# Patient Record
Sex: Female | Born: 1985 | Race: Black or African American | Hispanic: No | Marital: Single | Smoking: Current every day smoker
Health system: Southern US, Community
[De-identification: ages and names within clinical notes are randomized; demographics above are authoritative.]

## PROBLEM LIST (undated history)

## (undated) ENCOUNTER — Emergency Department (HOSPITAL_COMMUNITY): Admission: EM | Payer: No Typology Code available for payment source | Source: Home / Self Care

## (undated) ENCOUNTER — Emergency Department (HOSPITAL_COMMUNITY): Admission: EM | Payer: Medicaid Other | Source: Home / Self Care

## (undated) ENCOUNTER — Ambulatory Visit (HOSPITAL_COMMUNITY): Admission: EM | Payer: 59 | Source: Home / Self Care

## (undated) DIAGNOSIS — R12 Heartburn: Secondary | ICD-10-CM

## (undated) DIAGNOSIS — G43909 Migraine, unspecified, not intractable, without status migrainosus: Secondary | ICD-10-CM

## (undated) DIAGNOSIS — O039 Complete or unspecified spontaneous abortion without complication: Secondary | ICD-10-CM

## (undated) DIAGNOSIS — N159 Renal tubulo-interstitial disease, unspecified: Secondary | ICD-10-CM

---
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
## 2005-06-01 ENCOUNTER — Emergency Department: Payer: Self-pay | Admitting: Emergency Medicine

## 2005-07-24 ENCOUNTER — Emergency Department: Payer: Self-pay | Admitting: Emergency Medicine

## 2005-12-18 ENCOUNTER — Emergency Department: Payer: Self-pay | Admitting: Emergency Medicine

## 2006-01-15 ENCOUNTER — Emergency Department: Payer: Self-pay | Admitting: Emergency Medicine

## 2006-01-17 ENCOUNTER — Ambulatory Visit: Payer: Self-pay | Admitting: Emergency Medicine

## 2006-01-20 ENCOUNTER — Emergency Department: Payer: Self-pay | Admitting: Emergency Medicine

## 2006-01-20 ENCOUNTER — Emergency Department (HOSPITAL_COMMUNITY): Admission: EM | Admit: 2006-01-20 | Discharge: 2006-01-20 | Payer: Self-pay | Admitting: Emergency Medicine

## 2006-01-21 ENCOUNTER — Emergency Department: Payer: Self-pay | Admitting: Emergency Medicine

## 2006-07-14 ENCOUNTER — Emergency Department (HOSPITAL_COMMUNITY): Admission: EM | Admit: 2006-07-14 | Discharge: 2006-07-14 | Payer: Self-pay | Admitting: Emergency Medicine

## 2006-09-08 ENCOUNTER — Emergency Department (HOSPITAL_COMMUNITY): Admission: EM | Admit: 2006-09-08 | Discharge: 2006-09-09 | Payer: Self-pay | Admitting: Emergency Medicine

## 2007-04-18 ENCOUNTER — Emergency Department: Payer: Self-pay | Admitting: Emergency Medicine

## 2007-12-23 ENCOUNTER — Emergency Department: Payer: Self-pay | Admitting: Emergency Medicine

## 2008-10-24 ENCOUNTER — Emergency Department (HOSPITAL_COMMUNITY): Admission: EM | Admit: 2008-10-24 | Discharge: 2008-10-24 | Payer: Self-pay | Admitting: Emergency Medicine

## 2009-10-12 ENCOUNTER — Emergency Department (HOSPITAL_COMMUNITY): Admission: EM | Admit: 2009-10-12 | Discharge: 2009-10-13 | Payer: Self-pay | Admitting: Emergency Medicine

## 2009-12-21 ENCOUNTER — Emergency Department (HOSPITAL_COMMUNITY): Admission: EM | Admit: 2009-12-21 | Discharge: 2009-12-22 | Payer: Self-pay | Admitting: Emergency Medicine

## 2009-12-22 ENCOUNTER — Inpatient Hospital Stay (HOSPITAL_COMMUNITY): Admission: AD | Admit: 2009-12-22 | Discharge: 2009-12-22 | Payer: Self-pay | Admitting: Obstetrics and Gynecology

## 2009-12-22 ENCOUNTER — Ambulatory Visit: Payer: Self-pay | Admitting: Obstetrics and Gynecology

## 2009-12-24 ENCOUNTER — Ambulatory Visit: Payer: Self-pay | Admitting: Obstetrics and Gynecology

## 2009-12-24 ENCOUNTER — Encounter: Payer: Self-pay | Admitting: Family

## 2009-12-24 LAB — CONVERTED CEMR LAB: GC Probe Amp, Urine: NEGATIVE

## 2009-12-26 ENCOUNTER — Inpatient Hospital Stay (HOSPITAL_COMMUNITY): Admission: AD | Admit: 2009-12-26 | Discharge: 2009-12-26 | Payer: Self-pay | Admitting: Family Medicine

## 2010-01-14 ENCOUNTER — Ambulatory Visit: Payer: Self-pay | Admitting: Obstetrics and Gynecology

## 2010-01-20 ENCOUNTER — Ambulatory Visit: Payer: Self-pay | Admitting: Family

## 2010-01-20 ENCOUNTER — Inpatient Hospital Stay (HOSPITAL_COMMUNITY): Admission: AD | Admit: 2010-01-20 | Discharge: 2010-01-20 | Payer: Self-pay | Admitting: Obstetrics and Gynecology

## 2010-01-27 ENCOUNTER — Inpatient Hospital Stay (HOSPITAL_COMMUNITY): Admission: AD | Admit: 2010-01-27 | Discharge: 2010-01-27 | Payer: Self-pay | Admitting: Obstetrics & Gynecology

## 2010-01-27 ENCOUNTER — Ambulatory Visit: Payer: Self-pay | Admitting: Advanced Practice Midwife

## 2010-01-28 ENCOUNTER — Ambulatory Visit: Payer: Self-pay | Admitting: Obstetrics and Gynecology

## 2010-01-29 ENCOUNTER — Ambulatory Visit (HOSPITAL_COMMUNITY): Admission: RE | Admit: 2010-01-29 | Discharge: 2010-01-29 | Payer: Self-pay | Admitting: Family Medicine

## 2010-02-06 ENCOUNTER — Ambulatory Visit: Payer: Self-pay | Admitting: Obstetrics and Gynecology

## 2010-02-06 ENCOUNTER — Inpatient Hospital Stay (HOSPITAL_COMMUNITY): Admission: AD | Admit: 2010-02-06 | Discharge: 2010-02-07 | Payer: Self-pay | Admitting: Obstetrics and Gynecology

## 2010-02-11 ENCOUNTER — Ambulatory Visit: Payer: Self-pay | Admitting: Obstetrics and Gynecology

## 2010-02-25 ENCOUNTER — Ambulatory Visit: Payer: Self-pay | Admitting: Obstetrics and Gynecology

## 2010-03-04 ENCOUNTER — Ambulatory Visit: Payer: Self-pay | Admitting: Obstetrics and Gynecology

## 2010-03-11 ENCOUNTER — Ambulatory Visit: Payer: Self-pay | Admitting: Obstetrics & Gynecology

## 2010-03-14 ENCOUNTER — Observation Stay: Payer: Self-pay | Admitting: Obstetrics and Gynecology

## 2010-03-18 ENCOUNTER — Ambulatory Visit: Payer: Self-pay | Admitting: Obstetrics and Gynecology

## 2010-03-20 ENCOUNTER — Inpatient Hospital Stay (HOSPITAL_COMMUNITY): Admission: AD | Admit: 2010-03-20 | Discharge: 2010-03-20 | Payer: Self-pay | Admitting: Obstetrics & Gynecology

## 2010-03-25 ENCOUNTER — Ambulatory Visit: Payer: Self-pay | Admitting: Obstetrics and Gynecology

## 2010-03-27 ENCOUNTER — Inpatient Hospital Stay (HOSPITAL_COMMUNITY): Admission: AD | Admit: 2010-03-27 | Discharge: 2010-03-27 | Payer: Self-pay | Admitting: Obstetrics & Gynecology

## 2010-03-27 ENCOUNTER — Ambulatory Visit: Payer: Self-pay | Admitting: Obstetrics and Gynecology

## 2010-03-28 ENCOUNTER — Ambulatory Visit: Payer: Self-pay | Admitting: Obstetrics and Gynecology

## 2010-03-28 ENCOUNTER — Inpatient Hospital Stay (HOSPITAL_COMMUNITY): Admission: AD | Admit: 2010-03-28 | Discharge: 2010-04-01 | Payer: Self-pay | Admitting: Obstetrics and Gynecology

## 2010-03-31 ENCOUNTER — Ambulatory Visit: Payer: Self-pay | Admitting: Vascular Surgery

## 2010-03-31 ENCOUNTER — Encounter: Payer: Self-pay | Admitting: Obstetrics & Gynecology

## 2010-05-13 ENCOUNTER — Emergency Department (HOSPITAL_COMMUNITY): Admission: EM | Admit: 2010-05-13 | Discharge: 2010-05-13 | Payer: Self-pay | Admitting: Emergency Medicine

## 2010-05-14 ENCOUNTER — Ambulatory Visit: Payer: Self-pay | Admitting: Obstetrics and Gynecology

## 2010-05-14 LAB — CONVERTED CEMR LAB
MCV: 85.7 fL (ref 78.0–100.0)
Platelets: 361 10*3/uL (ref 150–400)
WBC: 9.9 10*3/uL (ref 4.0–10.5)

## 2010-06-29 ENCOUNTER — Ambulatory Visit: Payer: Self-pay | Admitting: Obstetrics & Gynecology

## 2010-07-24 ENCOUNTER — Emergency Department (HOSPITAL_COMMUNITY): Admission: EM | Admit: 2010-07-24 | Discharge: 2010-07-24 | Payer: Self-pay | Admitting: Emergency Medicine

## 2010-09-02 ENCOUNTER — Emergency Department (HOSPITAL_COMMUNITY): Admission: EM | Admit: 2010-09-02 | Discharge: 2010-09-02 | Payer: Self-pay | Admitting: Emergency Medicine

## 2010-09-07 ENCOUNTER — Emergency Department (HOSPITAL_COMMUNITY): Admission: EM | Admit: 2010-09-07 | Discharge: 2010-09-08 | Payer: Self-pay | Admitting: Emergency Medicine

## 2010-09-21 ENCOUNTER — Ambulatory Visit: Payer: Self-pay | Admitting: Obstetrics and Gynecology

## 2010-10-04 ENCOUNTER — Emergency Department (HOSPITAL_COMMUNITY)
Admission: EM | Admit: 2010-10-04 | Discharge: 2010-10-05 | Payer: Self-pay | Source: Home / Self Care | Admitting: Emergency Medicine

## 2010-11-23 ENCOUNTER — Emergency Department (HOSPITAL_COMMUNITY)
Admission: EM | Admit: 2010-11-23 | Discharge: 2010-11-23 | Payer: Self-pay | Source: Home / Self Care | Admitting: Emergency Medicine

## 2010-12-07 ENCOUNTER — Ambulatory Visit: Admit: 2010-12-07 | Payer: Self-pay | Admitting: Obstetrics and Gynecology

## 2011-01-18 ENCOUNTER — Emergency Department (HOSPITAL_COMMUNITY): Admission: EM | Admit: 2011-01-18 | Payer: Medicaid Other | Source: Home / Self Care

## 2011-01-18 ENCOUNTER — Emergency Department: Payer: Self-pay | Admitting: Unknown Physician Specialty

## 2011-01-20 ENCOUNTER — Ambulatory Visit: Payer: Self-pay | Admitting: Family Medicine

## 2011-01-27 LAB — CBC
MCV: 79.1 fL (ref 78.0–100.0)
Platelets: 300 10*3/uL (ref 150–400)
RBC: 4.87 MIL/uL (ref 3.87–5.11)
WBC: 12.4 10*3/uL — ABNORMAL HIGH (ref 4.0–10.5)

## 2011-01-27 LAB — DIFFERENTIAL
Eosinophils Relative: 1 % (ref 0–5)
Lymphocytes Relative: 28 % (ref 12–46)
Lymphs Abs: 3.4 10*3/uL (ref 0.7–4.0)
Neutro Abs: 7.7 10*3/uL (ref 1.7–7.7)

## 2011-01-27 LAB — POCT CARDIAC MARKERS
CKMB, poc: <1 ng/mL — ABNORMAL LOW (ref 1.0–8.0)
Myoglobin, poc: 42.4 ng/mL (ref 12–200)
Troponin i, poc: <0.05 ng/mL (ref 0.00–0.09)
Troponin i, poc: <0.05 ng/mL (ref 0.00–0.09)

## 2011-01-27 LAB — BASIC METABOLIC PANEL
Chloride: 108 mEq/L (ref 96–112)
Creatinine, Ser: 0.88 mg/dL (ref 0.4–1.2)
GFR calc Af Amer: >60 mL/min (ref >60)
Potassium: 3.7 mEq/L (ref 3.5–5.1)

## 2011-02-01 LAB — CBC
HCT: 25.8 % — ABNORMAL LOW (ref 36.0–46.0)
HCT: 36.7 % (ref 36.0–46.0)
Hemoglobin: 9.1 g/dL — ABNORMAL LOW (ref 12.0–15.0)
Hemoglobin: 9.4 g/dL — ABNORMAL LOW (ref 12.0–15.0)
MCHC: 34.6 g/dL (ref 30.0–36.0)
MCHC: 34.7 g/dL (ref 30.0–36.0)
MCHC: 35.3 g/dL (ref 30.0–36.0)
MCV: 89.4 fL (ref 78.0–100.0)
MCV: 89.6 fL (ref 78.0–100.0)
Platelets: 72 10*3/uL — ABNORMAL LOW (ref 150–400)
RBC: 2.89 MIL/uL — ABNORMAL LOW (ref 3.87–5.11)
RBC: 3.02 MIL/uL — ABNORMAL LOW (ref 3.87–5.11)
RDW: 14.2 % (ref 11.5–15.5)
RDW: 14.3 % (ref 11.5–15.5)
WBC: 17.2 10*3/uL — ABNORMAL HIGH (ref 4.0–10.5)

## 2011-02-01 LAB — PLATELET COUNT: Platelets: 132 10*3/uL — ABNORMAL LOW (ref 150–400)

## 2011-02-01 LAB — COMPREHENSIVE METABOLIC PANEL
Albumin: 3 g/dL — ABNORMAL LOW (ref 3.5–5.2)
BUN: 3 mg/dL — ABNORMAL LOW (ref 6–23)
Calcium: 8.8 mg/dL (ref 8.4–10.5)
Creatinine, Ser: 0.85 mg/dL (ref 0.4–1.2)
Glucose, Bld: 84 mg/dL (ref 70–99)
Total Protein: 6.5 g/dL (ref 6.0–8.3)

## 2011-02-02 LAB — POCT URINALYSIS DIP (DEVICE)
Hgb urine dipstick: NEGATIVE
Hgb urine dipstick: NEGATIVE
Hgb urine dipstick: NEGATIVE
Ketones, ur: NEGATIVE mg/dL
Ketones, ur: NEGATIVE mg/dL
Nitrite: NEGATIVE
Protein, ur: NEGATIVE mg/dL
Protein, ur: NEGATIVE mg/dL
Protein, ur: NEGATIVE mg/dL
Specific Gravity, Urine: 1.015 (ref 1.005–1.030)
Specific Gravity, Urine: 1.015 (ref 1.005–1.030)
Specific Gravity, Urine: 1.02 (ref 1.005–1.030)
Urobilinogen, UA: 0.2 mg/dL (ref 0.0–1.0)
Urobilinogen, UA: 0.2 mg/dL (ref 0.0–1.0)
Urobilinogen, UA: 1 mg/dL (ref 0.0–1.0)
pH: 5.5 (ref 5.0–8.0)
pH: 7 (ref 5.0–8.0)
pH: 6 (ref 5.0–8.0)

## 2011-02-02 LAB — CBC
HCT: 35 % — ABNORMAL LOW (ref 36.0–46.0)
MCHC: 35 g/dL (ref 30.0–36.0)
MCV: 88.9 fL (ref 78.0–100.0)
Platelets: 80 10*3/uL — ABNORMAL LOW (ref 150–400)
RDW: 14.3 % (ref 11.5–15.5)
WBC: 14 10*3/uL — ABNORMAL HIGH (ref 4.0–10.5)

## 2011-02-03 LAB — POCT URINALYSIS DIP (DEVICE)
Glucose, UA: NEGATIVE mg/dL
Protein, ur: NEGATIVE mg/dL
Specific Gravity, Urine: 1.025 (ref 1.005–1.030)
Urobilinogen, UA: 1 mg/dL (ref 0.0–1.0)

## 2011-02-03 LAB — URINALYSIS, ROUTINE W REFLEX MICROSCOPIC
Bilirubin Urine: NEGATIVE
Glucose, UA: NEGATIVE mg/dL
Hgb urine dipstick: NEGATIVE
Protein, ur: NEGATIVE mg/dL
Specific Gravity, Urine: 1.028 (ref 1.005–1.030)
Urobilinogen, UA: 2 mg/dL — ABNORMAL HIGH (ref 0.0–1.0)

## 2011-02-03 LAB — URINE MICROSCOPIC-ADD ON

## 2011-02-04 LAB — DIFFERENTIAL
Basophils Absolute: 0 10*3/uL (ref 0.0–0.1)
Basophils Relative: 0 % (ref 0–1)
Lymphocytes Relative: 13 % (ref 12–46)
Monocytes Absolute: 1.3 10*3/uL — ABNORMAL HIGH (ref 0.1–1.0)
Neutro Abs: 10.1 10*3/uL — ABNORMAL HIGH (ref 1.7–7.7)
Neutrophils Relative %: 76 % (ref 43–77)

## 2011-02-04 LAB — URINALYSIS, ROUTINE W REFLEX MICROSCOPIC
Glucose, UA: NEGATIVE mg/dL
Ketones, ur: NEGATIVE mg/dL
Nitrite: NEGATIVE
Protein, ur: NEGATIVE mg/dL
Urobilinogen, UA: 2 mg/dL — ABNORMAL HIGH (ref 0.0–1.0)

## 2011-02-04 LAB — RAPID URINE DRUG SCREEN, HOSP PERFORMED
Amphetamines: NOT DETECTED
Barbiturates: NOT DETECTED
Benzodiazepines: NOT DETECTED
Cocaine: NOT DETECTED
Opiates: NOT DETECTED
Tetrahydrocannabinol: NOT DETECTED

## 2011-02-04 LAB — POCT URINALYSIS DIP (DEVICE)
Bilirubin Urine: NEGATIVE
Glucose, UA: NEGATIVE mg/dL
Hgb urine dipstick: NEGATIVE
Ketones, ur: NEGATIVE mg/dL
Nitrite: NEGATIVE

## 2011-02-04 LAB — TYPE AND SCREEN
ABO/RH(D): B POS
Antibody Screen: NEGATIVE

## 2011-02-04 LAB — RPR: RPR Ser Ql: NONREACTIVE

## 2011-02-04 LAB — CBC
Hemoglobin: 11.6 g/dL — ABNORMAL LOW (ref 12.0–15.0)
MCHC: 34.1 g/dL (ref 30.0–36.0)
Platelets: 220 10*3/uL (ref 150–400)
RDW: 13.7 % (ref 11.5–15.5)

## 2011-02-04 LAB — RUBELLA SCREEN: Rubella: 23.9 IU/mL — ABNORMAL HIGH

## 2011-02-04 LAB — HEPATITIS B SURFACE ANTIGEN: Hepatitis B Surface Ag: NEGATIVE

## 2011-02-04 LAB — URINE CULTURE

## 2011-02-04 LAB — URINE MICROSCOPIC-ADD ON

## 2011-02-08 LAB — COMPREHENSIVE METABOLIC PANEL
AST: 11 U/L (ref 0–37)
BUN: 4 mg/dL — ABNORMAL LOW (ref 6–23)
CO2: 25 mEq/L (ref 19–32)
Chloride: 105 mEq/L (ref 96–112)
Creatinine, Ser: 0.66 mg/dL (ref 0.4–1.2)
GFR calc non Af Amer: >60 mL/min (ref >60)
Glucose, Bld: 92 mg/dL (ref 70–99)
Total Bilirubin: 0.3 mg/dL (ref 0.3–1.2)

## 2011-02-08 LAB — URINALYSIS, ROUTINE W REFLEX MICROSCOPIC
Bilirubin Urine: NEGATIVE
Nitrite: NEGATIVE
Specific Gravity, Urine: >1.03 — ABNORMAL HIGH (ref 1.005–1.030)
Urobilinogen, UA: 1 mg/dL (ref 0.0–1.0)

## 2011-02-08 LAB — POCT URINALYSIS DIP (DEVICE)
Bilirubin Urine: NEGATIVE
Bilirubin Urine: NEGATIVE
Glucose, UA: NEGATIVE mg/dL
Hgb urine dipstick: NEGATIVE
Ketones, ur: NEGATIVE mg/dL
Ketones, ur: NEGATIVE mg/dL
Nitrite: NEGATIVE
Specific Gravity, Urine: 1.015 (ref 1.005–1.030)
Specific Gravity, Urine: 1.02 (ref 1.005–1.030)
pH: 7 (ref 5.0–8.0)

## 2011-02-08 LAB — CBC
HCT: 33.2 % — ABNORMAL LOW (ref 36.0–46.0)
Hemoglobin: 11.4 g/dL — ABNORMAL LOW (ref 12.0–15.0)
MCHC: 34.2 g/dL (ref 30.0–36.0)
MCV: 91.7 fL (ref 78.0–100.0)
RBC: 3.62 MIL/uL — ABNORMAL LOW (ref 3.87–5.11)
WBC: 17 10*3/uL — ABNORMAL HIGH (ref 4.0–10.5)

## 2011-02-10 ENCOUNTER — Ambulatory Visit: Payer: Self-pay | Admitting: Family Medicine

## 2011-02-20 ENCOUNTER — Emergency Department (HOSPITAL_COMMUNITY)
Admission: EM | Admit: 2011-02-20 | Discharge: 2011-02-21 | Disposition: A | Discharge status: Home / Self Care | Payer: Medicaid Other | Attending: Emergency Medicine | Admitting: Emergency Medicine

## 2011-02-20 DIAGNOSIS — R51 Headache: Secondary | ICD-10-CM | POA: Insufficient documentation

## 2011-02-20 DIAGNOSIS — J3489 Other specified disorders of nose and nasal sinuses: Secondary | ICD-10-CM | POA: Insufficient documentation

## 2011-02-20 DIAGNOSIS — R110 Nausea: Secondary | ICD-10-CM | POA: Insufficient documentation

## 2011-02-20 DIAGNOSIS — H6690 Otitis media, unspecified, unspecified ear: Secondary | ICD-10-CM | POA: Insufficient documentation

## 2011-02-20 DIAGNOSIS — J329 Chronic sinusitis, unspecified: Secondary | ICD-10-CM | POA: Insufficient documentation

## 2011-03-26 ENCOUNTER — Ambulatory Visit: Payer: Medicaid Other | Admitting: Family Medicine

## 2011-03-29 ENCOUNTER — Emergency Department (HOSPITAL_COMMUNITY)
Admission: EM | Admit: 2011-03-29 | Discharge: 2011-03-29 | Discharge status: Against Medical Advice | Payer: Medicaid Other | Attending: Emergency Medicine | Admitting: Emergency Medicine

## 2011-03-29 DIAGNOSIS — Z0389 Encounter for observation for other suspected diseases and conditions ruled out: Secondary | ICD-10-CM | POA: Insufficient documentation

## 2011-03-30 ENCOUNTER — Emergency Department (HOSPITAL_COMMUNITY)
Admission: EM | Admit: 2011-03-30 | Discharge: 2011-03-30 | Disposition: A | Discharge status: Home / Self Care | Payer: Medicaid Other | Attending: Emergency Medicine | Admitting: Emergency Medicine

## 2011-03-30 DIAGNOSIS — J329 Chronic sinusitis, unspecified: Secondary | ICD-10-CM | POA: Insufficient documentation

## 2011-03-30 DIAGNOSIS — R51 Headache: Secondary | ICD-10-CM | POA: Insufficient documentation

## 2011-03-30 DIAGNOSIS — H53149 Visual discomfort, unspecified: Secondary | ICD-10-CM | POA: Insufficient documentation

## 2011-03-30 DIAGNOSIS — J309 Allergic rhinitis, unspecified: Secondary | ICD-10-CM | POA: Insufficient documentation

## 2011-03-30 DIAGNOSIS — J3489 Other specified disorders of nose and nasal sinuses: Secondary | ICD-10-CM | POA: Insufficient documentation

## 2011-06-27 IMAGING — US US OB FOLLOW-UP
1 series · 14 of 28 positions shown · non-contrast
Comparison: none

OBSTETRICAL ULTRASOUND:
 This ultrasound exam was performed in the [HOSPITAL] Ultrasound Department.  The OB US report was generated in the AS system, and faxed to the ordering physician.  This report is also available in [HOSPITAL]?s [employer] and in [REDACTED] PACS.

[Series 1: us ob follow up · 0.30mm/px · 14 of 28 slices shown]
[im 2/28]
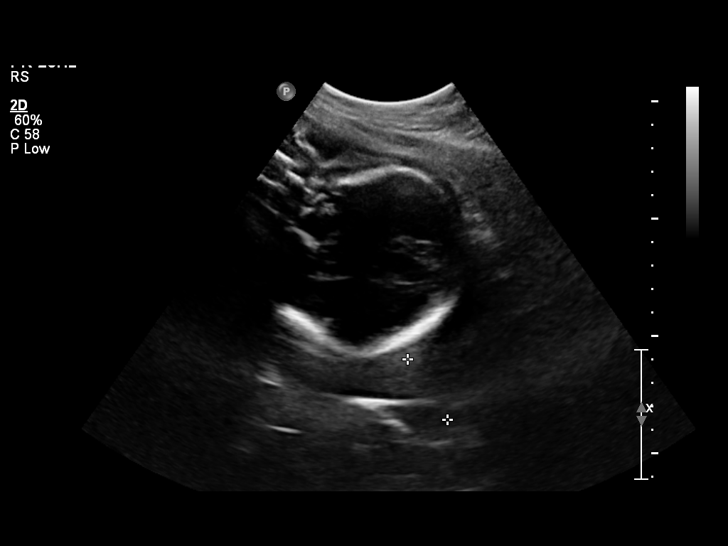
[im 4/28]
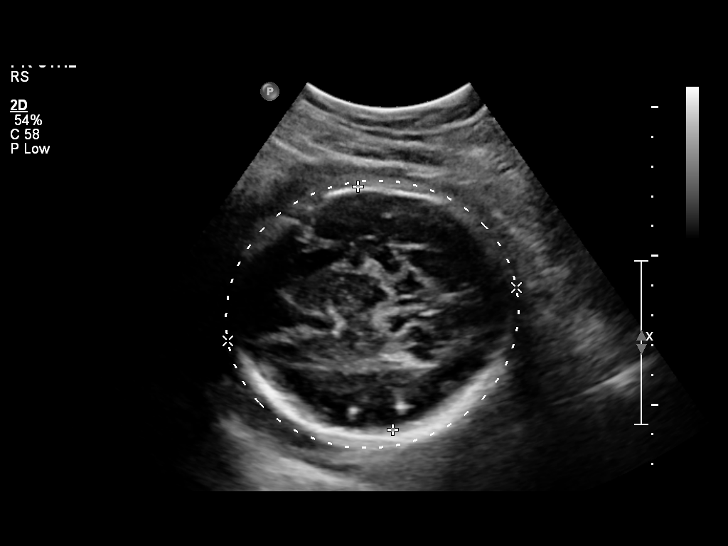
[im 6/28]
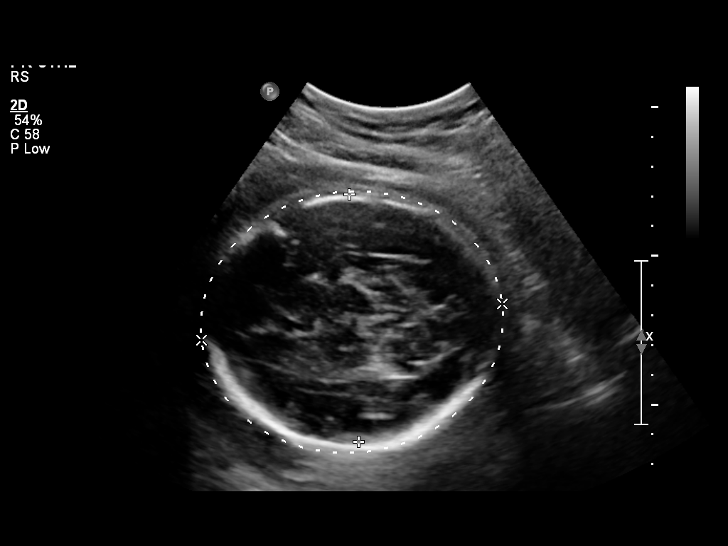
[im 8/28]
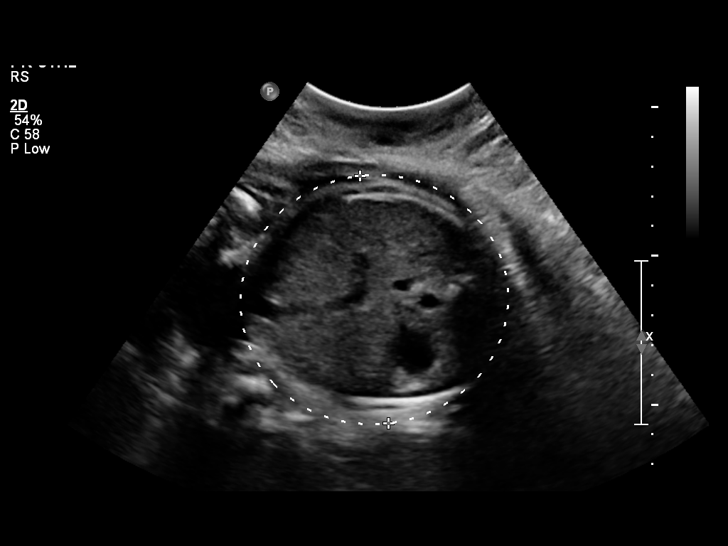
[im 10/28]
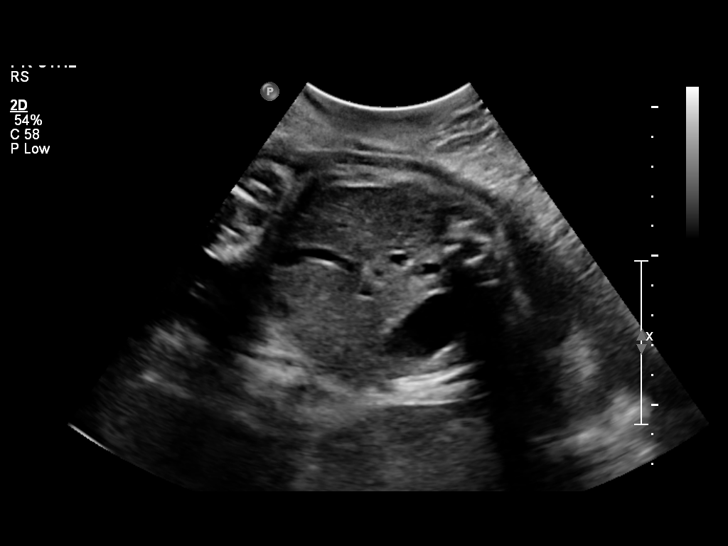
[im 12/28]
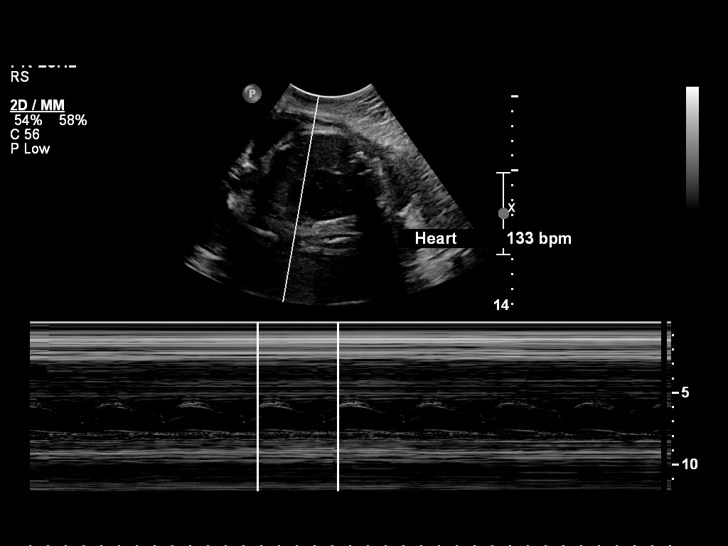
[im 14/28]
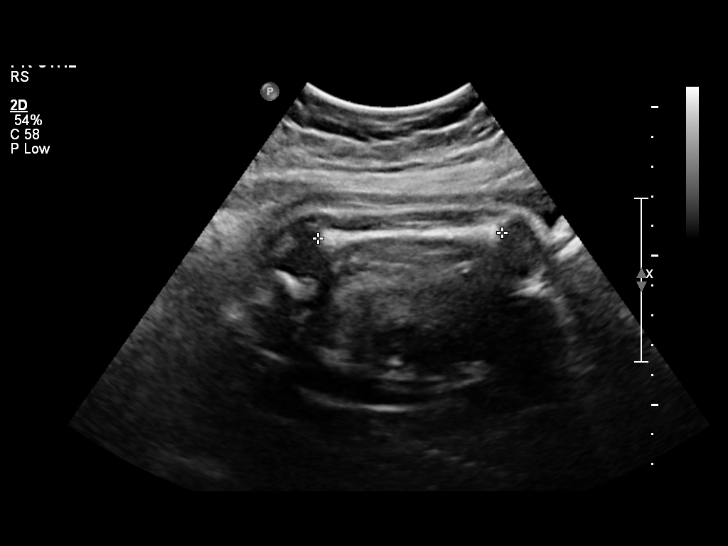
[im 16/28]
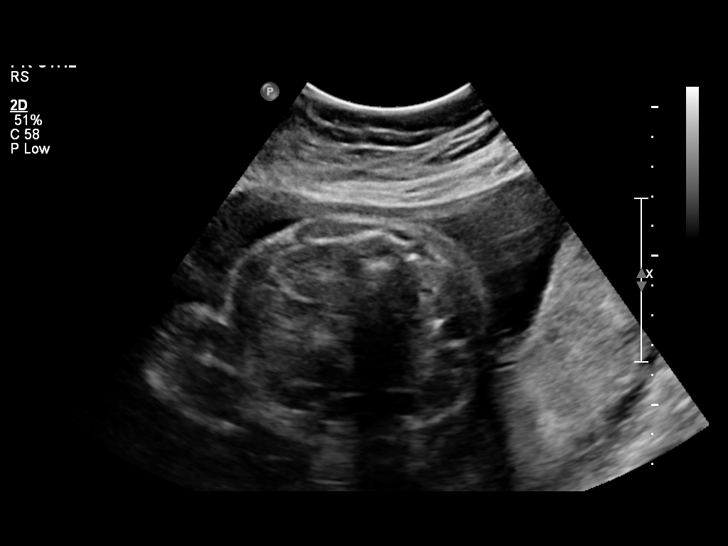
[im 18/28]
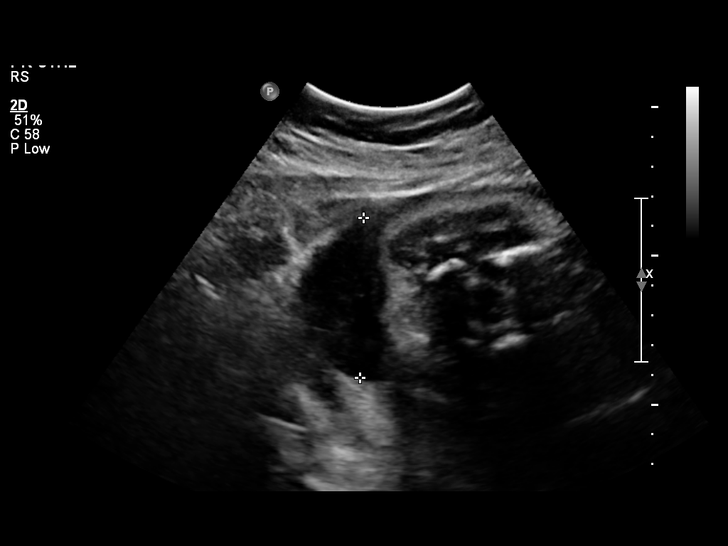
[im 20/28]
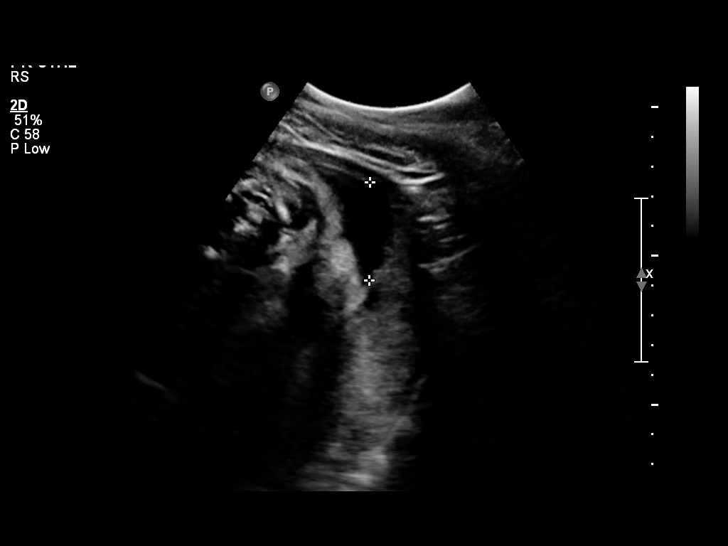
[im 22/28]
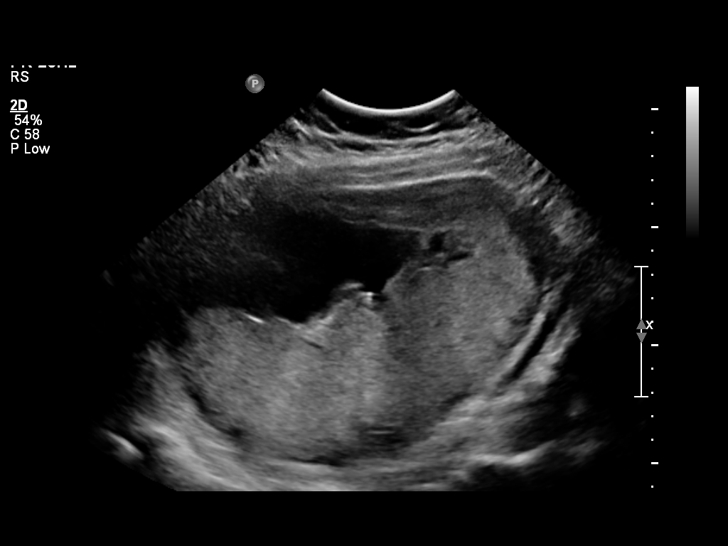
[im 24/28]
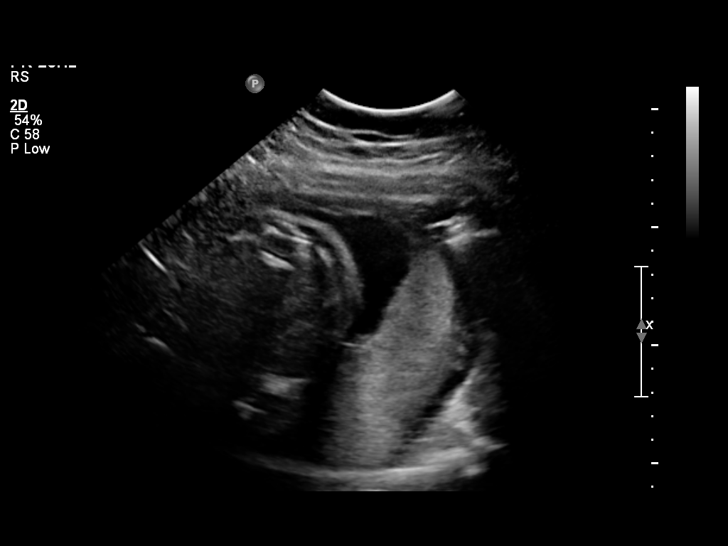
[im 26/28]
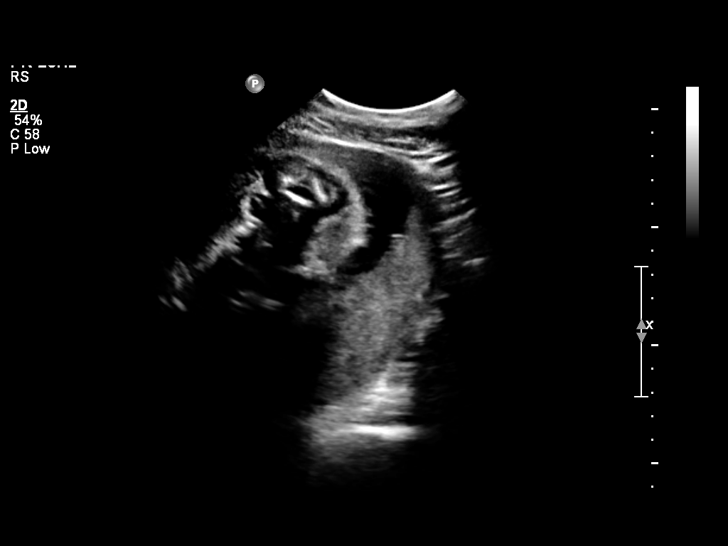
[im 28/28]
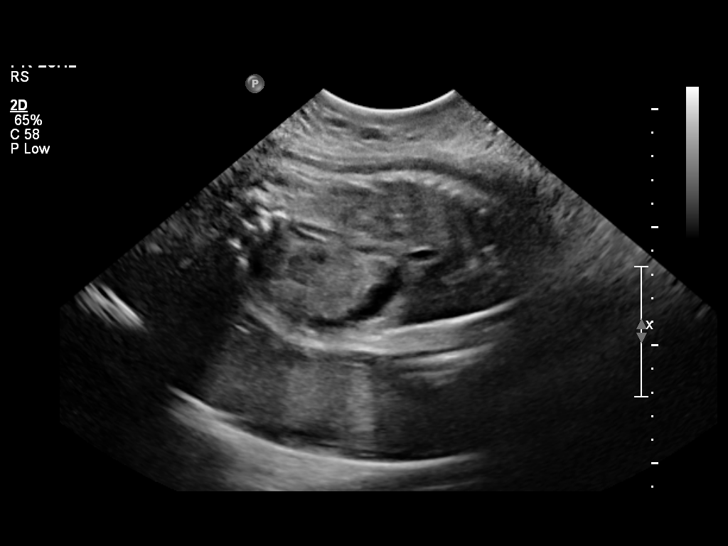

[14 of 28 positions shown; findings below may reference images not displayed]

IMPRESSION: See AS Obstetric US report.

## 2011-07-02 ENCOUNTER — Encounter: Payer: Self-pay | Admitting: Family Medicine

## 2011-07-02 ENCOUNTER — Other Ambulatory Visit (HOSPITAL_COMMUNITY)
Admission: RE | Admit: 2011-07-02 | Discharge: 2011-07-02 | Disposition: A | Discharge status: Home / Self Care | Payer: Medicaid Other | Source: Ambulatory Visit | Attending: Family Medicine | Admitting: Family Medicine

## 2011-07-02 ENCOUNTER — Ambulatory Visit (INDEPENDENT_AMBULATORY_CARE_PROVIDER_SITE_OTHER): Payer: Medicaid Other | Admitting: Family Medicine

## 2011-07-02 VITALS — BP 119/78 | HR 86 | Temp 97.4°F | Ht 66.0 in | Wt 262.0 lb

## 2011-07-02 DIAGNOSIS — Z0000 Encounter for general adult medical examination without abnormal findings: Secondary | ICD-10-CM

## 2011-07-02 DIAGNOSIS — Z01419 Encounter for gynecological examination (general) (routine) without abnormal findings: Secondary | ICD-10-CM | POA: Insufficient documentation

## 2011-07-02 DIAGNOSIS — Z3049 Encounter for surveillance of other contraceptives: Secondary | ICD-10-CM

## 2011-07-02 DIAGNOSIS — Z309 Encounter for contraceptive management, unspecified: Secondary | ICD-10-CM

## 2011-07-02 LAB — POCT PREGNANCY, URINE: Preg Test, Ur: NEGATIVE

## 2011-07-02 MED ORDER — MEDROXYPROGESTERONE ACETATE 150 MG/ML IM SUSP
150.0000 mg | Freq: Once | INTRAMUSCULAR | Status: AC
  Administered 2011-07-02: 150 mg via INTRAMUSCULAR

## 2011-07-02 NOTE — Progress Notes (Signed)
  Subjective:    Patient ID: Amy Mcfarland, female    DOB: 01-18-1986, 25 y.o.   MRN: [id number]  HPI Here for routine annual GYN visit.  LMP 7/21.  28 day cycle.  Flow 3-5 days.  Not sexually active now.  No vaginal discharge.  Denies dysmenorrhea, urinary incontinence, vaginal discharge, abnormal bleeding.  Would like to continue with depo provera.   Review of Systems  Constitutional: Negative for fever, chills and fatigue.  Genitourinary: Negative for dysuria, frequency, hematuria, flank pain, vaginal bleeding, vaginal discharge, difficulty urinating, vaginal pain and dyspareunia.  Musculoskeletal: Negative for back pain.       Objective:   Physical Exam  Constitutional: She appears well-developed and well-nourished.  Abdominal: She exhibits no distension and no mass. There is no tenderness. There is no rebound and no guarding.  Genitourinary:       External female genitalia normal.  Normal urethra, vaginal walls.  Cervix grossly normal.  Normal feeling uterus and adenexa.  PAP done.  Skin: Skin is warm and dry.          Assessment & Plan:  1. Health exam -PAP done  2. Contraception management Pt wishes to continue with depo.

## 2011-07-02 NOTE — Progress Notes (Signed)
Pt wants to start back on Depo Provera. Also needs pap smear

## 2011-07-08 ENCOUNTER — Encounter: Payer: Self-pay | Admitting: *Deleted

## 2011-07-12 ENCOUNTER — Inpatient Hospital Stay (INDEPENDENT_AMBULATORY_CARE_PROVIDER_SITE_OTHER)
Admission: RE | Admit: 2011-07-12 | Discharge: 2011-07-12 | Disposition: A | Discharge status: Home / Self Care | Payer: Medicaid Other | Source: Ambulatory Visit | Attending: Family Medicine | Admitting: Family Medicine

## 2011-07-12 ENCOUNTER — Ambulatory Visit (INDEPENDENT_AMBULATORY_CARE_PROVIDER_SITE_OTHER): Payer: Medicaid Other

## 2011-07-12 DIAGNOSIS — S8010XA Contusion of unspecified lower leg, initial encounter: Secondary | ICD-10-CM

## 2011-07-12 LAB — D-DIMER, QUANTITATIVE: D-Dimer, Quant: <0.22 ug/mL-FEU (ref 0.00–0.48)

## 2011-08-01 ENCOUNTER — Emergency Department (HOSPITAL_COMMUNITY): Payer: Medicaid Other

## 2011-08-01 ENCOUNTER — Emergency Department (HOSPITAL_COMMUNITY)
Admission: EM | Admit: 2011-08-01 | Discharge: 2011-08-02 | Disposition: A | Discharge status: Home / Self Care | Payer: Medicaid Other | Attending: Emergency Medicine | Admitting: Emergency Medicine

## 2011-08-01 DIAGNOSIS — J029 Acute pharyngitis, unspecified: Secondary | ICD-10-CM | POA: Insufficient documentation

## 2011-08-01 DIAGNOSIS — R059 Cough, unspecified: Secondary | ICD-10-CM | POA: Insufficient documentation

## 2011-08-01 DIAGNOSIS — R6889 Other general symptoms and signs: Secondary | ICD-10-CM | POA: Insufficient documentation

## 2011-08-01 DIAGNOSIS — G43909 Migraine, unspecified, not intractable, without status migrainosus: Secondary | ICD-10-CM | POA: Insufficient documentation

## 2011-08-01 DIAGNOSIS — J3489 Other specified disorders of nose and nasal sinuses: Secondary | ICD-10-CM | POA: Insufficient documentation

## 2011-08-01 DIAGNOSIS — R05 Cough: Secondary | ICD-10-CM | POA: Insufficient documentation

## 2011-08-01 DIAGNOSIS — J069 Acute upper respiratory infection, unspecified: Secondary | ICD-10-CM | POA: Insufficient documentation

## 2011-09-17 ENCOUNTER — Ambulatory Visit: Payer: Medicaid Other

## 2011-10-22 ENCOUNTER — Ambulatory Visit: Payer: Medicaid Other

## 2011-10-22 ENCOUNTER — Emergency Department (HOSPITAL_COMMUNITY)
Admission: EM | Admit: 2011-10-22 | Discharge: 2011-10-22 | Disposition: A | Discharge status: Home / Self Care | Payer: Medicaid Other | Attending: Emergency Medicine | Admitting: Emergency Medicine

## 2011-10-22 ENCOUNTER — Encounter (HOSPITAL_COMMUNITY): Payer: Self-pay | Admitting: Emergency Medicine

## 2011-10-22 DIAGNOSIS — M549 Dorsalgia, unspecified: Secondary | ICD-10-CM

## 2011-10-22 DIAGNOSIS — M546 Pain in thoracic spine: Secondary | ICD-10-CM | POA: Insufficient documentation

## 2011-10-22 MED ORDER — OXYCODONE-ACETAMINOPHEN 5-325 MG PO TABS: 2.0000 | ORAL_TABLET | ORAL | Status: AC | PRN

## 2011-10-22 NOTE — ED Notes (Signed)
Pt states she is here for back pain with no known injury, she states she has not been lifting anything heavy and it hurts when here arms are down and hanging, pt states when she moves her hands up the pain is relieved, she states she has been hurting for 2 weeks

## 2011-10-22 NOTE — ED Notes (Signed)
PT. REPORTS UPPER BACK PAIN FOR 2 WEEKS ,  DENIES INJURY OR FALL , PAIN WITH MOVEMENT AND CERTAIN POSITIONS ,  SLIGHT SOB , NO COUGH.

## 2011-10-22 NOTE — ED Provider Notes (Signed)
History     CSN: [id number] Arrival date & time: 10/22/2011  2:16 AM   First MD Initiated Contact with Patient 10/22/11 0401      Chief Complaint  Patient presents with  . Back Pain    (Consider location/radiation/quality/duration/timing/severity/associated sxs/prior treatment) HPI This 25 year old female has 2 weeks of atraumatic upper back pain without radiation or associated symptoms. This pain is constant severe and positional nonexertional. She is no fever chest pain cough shortness of breath abdominal pain vomiting radiation weakness numbness paresthesias change in bowel or bladder function or other concerns. She has not had any fever or IV drug use or rash. There's been no recent trauma travel or immobilization. She would like a work excuse for the next couple of days. She states she has had good liquid Percocet for pain in the past and over-the-counter medicines are not helping this pain yet. History reviewed. No pertinent past medical history.  Past Surgical History  Procedure Date  . Cesarean section   . Cesarean section     Family History  Problem Relation Age of Onset  . Asthma Maternal Uncle   . Cancer Maternal Grandmother     breast cancer  . Cancer Paternal Grandmother     History  Substance Use Topics  . Smoking status: Smoker, Current Status Unknown -- 0.2 packs/day for 7 years    Types: Cigarettes  . Smokeless tobacco: Never Used  . Alcohol Use: Yes    OB History    Grav Para Term Preterm Abortions TAB SAB Ect Mult Living   2    1  1   1       Review of Systems  Constitutional: Negative for fever.       10 Systems reviewed and are negative for acute change except as noted in the HPI.  HENT: Negative for congestion.   Eyes: Negative for discharge and redness.  Respiratory: Negative for cough and shortness of breath.   Cardiovascular: Negative for chest pain.  Gastrointestinal: Negative for vomiting and abdominal pain.  Musculoskeletal: Positive  for back pain.  Skin: Negative for rash.  Neurological: Negative for syncope, numbness and headaches.  Psychiatric/Behavioral:       No behavior change.    Allergies  Review of patient's allergies indicates no known allergies.  Home Medications   Current Outpatient Rx  Name Route Sig Dispense Refill  . IBUPROFEN 200 MG PO TABS Oral Take 200 mg by mouth every 6 (six) hours as needed. For pain    . OXYCODONE-ACETAMINOPHEN 5-325 MG PO TABS Oral Take 2 tablets by mouth every 4 (four) hours as needed for pain. 15 tablet 0    BP 109/59  Pulse 83  Temp(Src) 98.2 F (36.8 C) (Oral)  Resp 16  SpO2 100%  Physical Exam  Nursing note and vitals reviewed. Constitutional:       Awake, alert, nontoxic appearance.  HENT:  Head: Atraumatic.  Mouth/Throat: Oropharynx is clear and moist.  Eyes: Right eye exhibits no discharge. Left eye exhibits no discharge.  Neck: Neck supple.  Cardiovascular: Normal rate, regular rhythm and normal heart sounds.   No murmur heard. Pulmonary/Chest: Effort normal and breath sounds normal. No respiratory distress. She has no wheezes. She has no rales. She exhibits no tenderness.  Abdominal: Soft. Bowel sounds are normal. There is no tenderness. There is no rebound.  Musculoskeletal: She exhibits no edema and no tenderness.       Baseline ROM, no obvious new focal weakness.  No midline  cervical thoracic or lumbar tenderness. She does have exactly reproducible parathoracic soft tissue tenderness and bilateral trapezius muscle region soft tissue tenderness.  Neurological: She is alert.       Mental status and motor strength appears baseline for patient and situation.  Skin: No rash noted.  Psychiatric: She has a normal mood and affect.    ED Course  Procedures (including critical care time)  Labs Reviewed - No data to display No results found.   1. Back pain       MDM  I doubt any other EMC precluding discharge at this time including, but not  necessarily limited to the following:SBI.        Hurman Horn, MD 10/22/11 [phone]

## 2011-11-24 ENCOUNTER — Encounter (HOSPITAL_COMMUNITY): Payer: Self-pay | Admitting: *Deleted

## 2011-11-24 ENCOUNTER — Emergency Department (INDEPENDENT_AMBULATORY_CARE_PROVIDER_SITE_OTHER)
Admission: EM | Admit: 2011-11-24 | Discharge: 2011-11-24 | Disposition: A | Discharge status: Home / Self Care | Payer: Medicaid Other | Source: Home / Self Care | Attending: Family Medicine | Admitting: Family Medicine

## 2011-11-24 DIAGNOSIS — K0889 Other specified disorders of teeth and supporting structures: Secondary | ICD-10-CM

## 2011-11-24 DIAGNOSIS — K089 Disorder of teeth and supporting structures, unspecified: Secondary | ICD-10-CM

## 2011-11-24 MED ORDER — CLINDAMYCIN HCL 150 MG PO CAPS: 150.0000 mg | ORAL_CAPSULE | Freq: Four times a day (QID) | ORAL | Status: AC

## 2011-11-24 MED ORDER — HYDROCODONE-ACETAMINOPHEN 5-325 MG PO TABS: ORAL_TABLET | ORAL | Status: AC

## 2011-11-24 NOTE — ED Notes (Signed)
Pt  Reports  r   Sided  Toothache      X  3  Days      r  Earache  As  Well

## 2011-11-24 NOTE — ED Provider Notes (Signed)
History     CSN: [id number]  Arrival date & time 11/24/11  1943   First MD Initiated Contact with Patient 11/24/11 1946      Chief Complaint  Patient presents with  . Dental Pain    (Consider location/radiation/quality/duration/timing/severity/associated sxs/prior treatment) HPI Comments: Amy Mcfarland presents for evaluation of RIGHT sided dental pain. She reports a long hx of dental problems and has had a root canal in the past. She also has multiple broken teeth. She reports seeing a dentist but he could not see her for the surgery until mid-February. She denies any fever.   Patient is a 26 y.o. female presenting with tooth pain. The history is provided by the patient.  Dental PainThe primary symptoms include mouth pain and dental injury. The symptoms are unchanged. The symptoms are new.  Mouth pain began more than 1 month ago. Mouth pain occurs intermittently. Affected locations include: teeth and gum(s).  Additional symptoms include: gum tenderness. Additional symptoms do not include: gum swelling and purulent gums.    History reviewed. No pertinent past medical history.  Past Surgical History  Procedure Date  . Cesarean section   . Cesarean section     Family History  Problem Relation Age of Onset  . Asthma Maternal Uncle   . Cancer Maternal Grandmother     breast cancer  . Cancer Paternal Grandmother     History  Substance Use Topics  . Smoking status: Smoker, Current Status Unknown -- 0.2 packs/day for 7 years    Types: Cigarettes  . Smokeless tobacco: Never Used  . Alcohol Use: Yes    OB History    Grav Para Term Preterm Abortions TAB SAB Ect Mult Living   2    1  1   1       Review of Systems  Constitutional: Negative.   HENT: Positive for dental problem.   Eyes: Negative.   Respiratory: Negative.   Cardiovascular: Negative.   Gastrointestinal: Negative.   Genitourinary: Negative.   Musculoskeletal: Negative.   Skin: Negative.   Neurological: Negative.      Allergies  Review of patient's allergies indicates no known allergies.  Home Medications   Current Outpatient Rx  Name Route Sig Dispense Refill  . CLINDAMYCIN HCL 150 MG PO CAPS Oral Take 1 capsule (150 mg total) by mouth every 6 (six) hours. 28 capsule 0  . HYDROCODONE-ACETAMINOPHEN 5-325 MG PO TABS  Take one to two tablets every 4 to 6 hours as needed for pain 20 tablet 0  . IBUPROFEN 200 MG PO TABS Oral Take 200 mg by mouth every 6 (six) hours as needed. For pain      BP 122/74  Pulse 71  Temp(Src) 99 F (37.2 C) (Oral)  Resp 20  SpO2 99%  Physical Exam  Nursing note and vitals reviewed. Constitutional: She is oriented to person, place, and time. She appears well-developed and well-nourished.  HENT:  Head: Normocephalic and atraumatic.  Right Ear: Tympanic membrane normal.  Left Ear: Tympanic membrane normal.  Mouth/Throat: Uvula is midline and mucous membranes are normal. Abnormal dentition. Dental caries present. No dental abscesses.    Eyes: EOM are normal.  Neck: Normal range of motion.  Pulmonary/Chest: Effort normal.  Musculoskeletal: Normal range of motion.  Neurological: She is alert and oriented to person, place, and time.  Skin: Skin is warm and dry.  Psychiatric: Her behavior is normal.    ED Course  Procedures (including critical care time)  Labs Reviewed - No data  to display No results found.   1. Pain, dental       MDM  Follow up with dental provider. Rx for hydrocodone and clindamycin        Richardo Priest, MD 11/24/11 2143

## 2011-12-01 ENCOUNTER — Ambulatory Visit (INDEPENDENT_AMBULATORY_CARE_PROVIDER_SITE_OTHER): Payer: Medicaid Other | Admitting: *Deleted

## 2011-12-01 VITALS — BP 129/77 | HR 80

## 2011-12-01 DIAGNOSIS — Z01812 Encounter for preprocedural laboratory examination: Secondary | ICD-10-CM

## 2011-12-01 DIAGNOSIS — Z3049 Encounter for surveillance of other contraceptives: Secondary | ICD-10-CM

## 2011-12-01 DIAGNOSIS — IMO0001 Reserved for inherently not codable concepts without codable children: Secondary | ICD-10-CM

## 2011-12-01 MED ORDER — MEDROXYPROGESTERONE ACETATE 150 MG/ML IM SUSP
150.0000 mg | Freq: Once | INTRAMUSCULAR | Status: AC
  Administered 2011-12-01: 150 mg via INTRAMUSCULAR

## 2011-12-01 NOTE — Progress Notes (Signed)
Patient here for depoprovera- last dose given 07/02/11 which is 21weeks 4 days, patient missed 2 appointments for next doses, patient states has not had period since started depoprovera- states has been on depoprovera a lot before /after pregnancy, states has used condoms when has had intercourse  And has not had intercourse in about 1 month. Called Dr. Debroah Loop states give urine pregnancy test, if negative may give depoprovera- urine pregnancy test negative-depoprovera given,  instructed patient to return in 12 weeks( 02/16/12-03/01/12 for next injection. Patient voices understanding.

## 2011-12-15 ENCOUNTER — Ambulatory Visit: Payer: Medicaid Other | Admitting: Obstetrics and Gynecology

## 2011-12-22 ENCOUNTER — Emergency Department (HOSPITAL_COMMUNITY)
Admission: EM | Admit: 2011-12-22 | Discharge: 2011-12-22 | Disposition: A | Discharge status: Home / Self Care | Payer: Medicaid Other | Attending: Emergency Medicine | Admitting: Emergency Medicine

## 2011-12-22 ENCOUNTER — Encounter (HOSPITAL_COMMUNITY): Payer: Self-pay | Admitting: *Deleted

## 2011-12-22 DIAGNOSIS — K029 Dental caries, unspecified: Secondary | ICD-10-CM | POA: Insufficient documentation

## 2011-12-22 DIAGNOSIS — K0889 Other specified disorders of teeth and supporting structures: Secondary | ICD-10-CM

## 2011-12-22 DIAGNOSIS — K089 Disorder of teeth and supporting structures, unspecified: Secondary | ICD-10-CM | POA: Insufficient documentation

## 2011-12-22 MED ORDER — OXYCODONE-ACETAMINOPHEN 5-325 MG PO TABS: 1.0000 | ORAL_TABLET | ORAL | Status: AC | PRN

## 2011-12-22 NOTE — ED Provider Notes (Signed)
History     CSN: [id number]  Arrival date & time 12/22/11  1644   First MD Initiated Contact with Patient 12/22/11 1735      Chief Complaint  Patient presents with  . Dental Pain    (Consider location/radiation/quality/duration/timing/severity/associated sxs/prior treatment) Patient is a 26 y.o. female presenting with tooth pain. The history is provided by the patient.  Dental PainThe primary symptoms include mouth pain.  Additional symptoms do not include: facial swelling.  States she has bad teeth that need to be pulled out. Has an appointment for 23rd of Feb. Unable to get one sooner. States was seen at [facility] given antibiotic and vicodin. States no relief. Describes pain as "worse then labor." Denies fever, chills, facial swelling. No other complaints.   History reviewed. No pertinent past medical history.  Past Surgical History  Procedure Date  . Cesarean section   . Cesarean section     Family History  Problem Relation Age of Onset  . Asthma Maternal Uncle   . Cancer Maternal Grandmother     breast cancer  . Cancer Paternal Grandmother     History  Substance Use Topics  . Smoking status: Smoker, Current Status Unknown -- 0.2 packs/day for 7 years    Types: Cigarettes  . Smokeless tobacco: Never Used  . Alcohol Use: Yes    OB History    Grav Para Term Preterm Abortions TAB SAB Ect Mult Living   2    1  1   1       Review of Systems  HENT: Positive for dental problem. Negative for facial swelling.   All other systems reviewed and are negative.    Allergies  Review of patient's allergies indicates no known allergies.  Home Medications   Current Outpatient Rx  Name Route Sig Dispense Refill  . IBUPROFEN 200 MG PO TABS Oral Take 200 mg by mouth every 6 (six) hours as needed. For pain      BP 122/80  Pulse 103  Temp(Src) 98.9 F (37.2 C) (Oral)  Resp 18  SpO2 99%  Physical Exam  Nursing note and vitals reviewed. Constitutional: She is oriented to  person, place, and time. She appears well-developed and well-nourished. No distress.  HENT:  Head: Normocephalic and atraumatic.  Right Ear: External ear normal.  Left Ear: External ear normal.  Nose: Nose normal.  Mouth/Throat: Oropharynx is clear and moist.       Two decayed teeth to the right lower jaw, 2nd bicuspid and 2nd molar. Pain with palpation. No surrounding gum swelling. One decayed tooth to the right upper jaw, 1st molar.   Eyes: Conjunctivae are normal.  Neck: Neck supple.  Cardiovascular: Normal rate, regular rhythm and normal heart sounds.   Pulmonary/Chest: Effort normal and breath sounds normal. No respiratory distress.  Musculoskeletal: Normal range of motion.  Neurological: She is alert and oriented to person, place, and time.  Skin: Skin is warm and dry.  Psychiatric: She has a normal mood and affect.    ED Course  Procedures (including critical care time)  Will treat with pain medications and dentist follow up. No signs of an abscess, pt afebrile, no distress.  No diagnosis found.    MDM          Lottie Mussel, PA 12/22/11 [phone]

## 2011-12-22 NOTE — ED Notes (Signed)
Pt here for severe right sided dental pain.  She was seen at [facility] for same last month and is waiting to see her DDS.  Increasing pain, no fever, chills or sob

## 2011-12-23 NOTE — ED Provider Notes (Signed)
Medical screening examination/treatment/procedure(s) were conducted as a shared visit with non-physician practitioner(s) and myself.  I personally evaluated the patient during the encounter  Toy Baker, MD 12/23/11 [phone]

## 2012-01-29 ENCOUNTER — Emergency Department (HOSPITAL_COMMUNITY)
Admission: EM | Admit: 2012-01-29 | Discharge: 2012-01-29 | Disposition: A | Discharge status: Home / Self Care | Payer: Medicaid Other | Attending: Emergency Medicine | Admitting: Emergency Medicine

## 2012-01-29 ENCOUNTER — Other Ambulatory Visit: Payer: Self-pay

## 2012-01-29 ENCOUNTER — Encounter (HOSPITAL_COMMUNITY): Payer: Self-pay | Admitting: *Deleted

## 2012-01-29 ENCOUNTER — Emergency Department (HOSPITAL_COMMUNITY): Payer: Medicaid Other

## 2012-01-29 DIAGNOSIS — R509 Fever, unspecified: Secondary | ICD-10-CM | POA: Insufficient documentation

## 2012-01-29 DIAGNOSIS — R059 Cough, unspecified: Secondary | ICD-10-CM | POA: Insufficient documentation

## 2012-01-29 DIAGNOSIS — R0781 Pleurodynia: Secondary | ICD-10-CM

## 2012-01-29 DIAGNOSIS — R062 Wheezing: Secondary | ICD-10-CM | POA: Insufficient documentation

## 2012-01-29 DIAGNOSIS — R071 Chest pain on breathing: Secondary | ICD-10-CM | POA: Insufficient documentation

## 2012-01-29 DIAGNOSIS — J3489 Other specified disorders of nose and nasal sinuses: Secondary | ICD-10-CM | POA: Insufficient documentation

## 2012-01-29 DIAGNOSIS — R05 Cough: Secondary | ICD-10-CM | POA: Insufficient documentation

## 2012-01-29 DIAGNOSIS — J069 Acute upper respiratory infection, unspecified: Secondary | ICD-10-CM | POA: Insufficient documentation

## 2012-01-29 MED ORDER — HYDROCODONE-ACETAMINOPHEN 7.5-500 MG/15ML PO SOLN: 15.0000 mL | Freq: Four times a day (QID) | ORAL | Status: AC | PRN

## 2012-01-29 NOTE — ED Notes (Signed)
She has a cold and  She has some  Mid-chest pain for 2 days and she has an earache..  Dry cough

## 2012-01-29 NOTE — ED Provider Notes (Addendum)
History     CSN: [id number]  Arrival date & time 01/29/12  0100   First MD Initiated Contact with Patient 01/29/12 0206      Chief Complaint  Patient presents with  . Chest Pain    (Consider location/radiation/quality/duration/timing/severity/associated sxs/prior treatment) Patient is a 26 y.o. female presenting with URI and chest pain. The history is provided by the patient.  URI The primary symptoms include fever, cough and wheezing. Primary symptoms do not include ear pain, sore throat, nausea or vomiting. Primary symptoms comment: chest pain The current episode started 3 to 5 days ago. This is a new problem. The problem has been gradually worsening.  The cough began 3 to 5 days ago. The cough is new. The cough is dry and non-productive.  The onset of the illness is associated with exposure to sick contacts (son with same sx). Symptoms associated with the illness include congestion and rhinorrhea. The illness is not associated with sinus pressure.  Chest Pain The chest pain began yesterday. Duration of episode(s) is 1 day. Chest pain occurs frequently. The chest pain is unchanged. The pain is associated with breathing and coughing. At its most intense, the pain is at 5/10. The pain is currently at 5/10. The severity of the pain is moderate. The quality of the pain is described as pleuritic. The pain does not radiate. Primary symptoms include a fever, cough and wheezing. Pertinent negatives for primary symptoms include no nausea and no vomiting. Primary symptoms comment: chest pain She tried NSAIDs for the symptoms.     History reviewed. No pertinent past medical history.  Past Surgical History  Procedure Date  . Cesarean section   . Cesarean section     Family History  Problem Relation Age of Onset  . Asthma Maternal Uncle   . Cancer Maternal Grandmother     breast cancer  . Cancer Paternal Grandmother     History  Substance Use Topics  . Smoking status: Smoker, Current  Status Unknown -- 0.2 packs/day for 7 years    Types: Cigarettes  . Smokeless tobacco: Never Used  . Alcohol Use: Yes    OB History    Grav Para Term Preterm Abortions TAB SAB Ect Mult Living   2    1  1   1       Review of Systems  Constitutional: Positive for fever.  HENT: Positive for congestion and rhinorrhea. Negative for ear pain, sore throat and sinus pressure.   Respiratory: Positive for cough and wheezing.   Cardiovascular: Positive for chest pain.  Gastrointestinal: Negative for nausea and vomiting.    Allergies  Review of patient's allergies indicates no known allergies.  Home Medications   Current Outpatient Rx  Name Route Sig Dispense Refill  . AMOXICILLIN 500 MG PO CAPS Oral Take 500 mg by mouth 3 (three) times daily.    . IBUPROFEN 200 MG PO TABS Oral Take 200 mg by mouth every 6 (six) hours as needed. For pain      BP 118/56  Pulse 102  Temp(Src) 99 F (37.2 C) (Oral)  Resp 20  SpO2 99%  Physical Exam  Nursing note and vitals reviewed. Constitutional: She is oriented to person, place, and time. She appears well-developed and well-nourished. No distress.  HENT:  Head: Normocephalic and atraumatic.  Right Ear: Tympanic membrane and ear canal normal.  Left Ear: Tympanic membrane and ear canal normal.  Mouth/Throat: No oropharyngeal exudate, posterior oropharyngeal edema, posterior oropharyngeal erythema or tonsillar abscesses.  Eyes: EOM are normal. Pupils are equal, round, and reactive to light.  Cardiovascular: Normal rate, regular rhythm, normal heart sounds and intact distal pulses.  Exam reveals no friction rub.   No murmur heard. Pulmonary/Chest: Effort normal and breath sounds normal. She has no wheezes. She has no rales. She exhibits tenderness.  Abdominal: Soft. Bowel sounds are normal. She exhibits no distension. There is no tenderness. There is no rebound and no guarding.  Musculoskeletal: Normal range of motion. She exhibits no tenderness.        No edema  Lymphadenopathy:    She has no cervical adenopathy.  Neurological: She is alert and oriented to person, place, and time. No cranial nerve deficit.  Skin: Skin is warm and dry. No rash noted.  Psychiatric: She has a normal mood and affect. Her behavior is normal.    ED Course  Procedures (including critical care time)  Labs Reviewed - No data to display Dg Chest 2 View  01/29/2012  *RADIOLOGY REPORT*  Clinical Data: Chest pain.  CHEST - 2 VIEW  Comparison: 08/01/2011  Findings: The heart, mediastinal, and hilar contours are normal. The lungs are well-expanded and clear. Negative for pleural effusion. The bony thorax is unremarkable.  IMPRESSION: No acute cardiopulmonary disease.  Original Report Authenticated By: Britta Mccreedy, M.D.     1. Pleuritic chest pain   2. URI (upper respiratory infection)       MDM  Pt with symptoms consistent with viral URI and pleuritic chest pain.  Well appearing here.  No signs of breathing difficulty or wheezing.  No signs of pharyngitis, otitis or abnormal abdominal findings.   CXR wnl and pt to return with any further problems.         Gwyneth Sprout, MD 01/29/12 1610  Gwyneth Sprout, MD 01/29/12 Earle Gell

## 2012-02-16 ENCOUNTER — Encounter (HOSPITAL_COMMUNITY): Payer: Self-pay | Admitting: *Deleted

## 2012-02-16 ENCOUNTER — Emergency Department (INDEPENDENT_AMBULATORY_CARE_PROVIDER_SITE_OTHER)
Admission: EM | Admit: 2012-02-16 | Discharge: 2012-02-16 | Disposition: A | Discharge status: Home / Self Care | Payer: Medicaid Other | Source: Home / Self Care

## 2012-02-16 DIAGNOSIS — K047 Periapical abscess without sinus: Secondary | ICD-10-CM

## 2012-02-16 MED ORDER — OXYCODONE-ACETAMINOPHEN 5-325 MG PO TABS: 1.0000 | ORAL_TABLET | ORAL | Status: AC | PRN

## 2012-02-16 MED ORDER — AMOXICILLIN 500 MG PO CAPS: 500.0000 mg | ORAL_CAPSULE | Freq: Three times a day (TID) | ORAL | Status: DC

## 2012-02-16 NOTE — ED Provider Notes (Signed)
Medical screening examination/treatment/procedure(s) were performed by resident physician or non-physician practitioner and as supervising physician I was immediately available for consultation/collaboration.   Nicey Krah DOUGLAS MD.    Javonda Suh D Korrie Hofbauer, MD 02/16/12 2058 

## 2012-02-16 NOTE — ED Provider Notes (Signed)
Amy Mcfarland is a 26 y.o. female who presents to Urgent Care today for tooth abscess.  Located in the back lower right.  This is the same tooth that has been seen before in urgent care and the emergency room.  She was treated with amoxicillin and Percocet.  The pain initially went away but came back 3 days ago.  She made it to the dentist for this pain but refused the offered dental extraction.  When the pain returned she called her dentist and was asked to schedule an appointment for the next 2-3 weeks.  She denies any fevers or chills or trouble swallowing or breathing.     PMH reviewed. Significant for obesity and tobacco smoker ROS as above otherwise neg.  no chest pains, palpitations, fevers, chills, abdominal pain nausea or vomiting. Medications reviewed. No current facility-administered medications for this encounter.   Current Outpatient Prescriptions  Medication Sig Dispense Refill  . amoxicillin (AMOXIL) 500 MG capsule Take 1 capsule (500 mg total) by mouth 3 (three) times daily.  42 capsule  0  . ibuprofen (ADVIL,MOTRIN) 200 MG tablet Take 200 mg by mouth every 6 (six) hours as needed. For pain      . oxyCODONE-acetaminophen (ROXICET) 5-325 MG per tablet Take 1 tablet by mouth every 4 (four) hours as needed for pain.  30 tablet  0    Exam:  BP 122/72  Pulse 78  Temp 98.6 F (37 C)  Resp 16  SpO2 100% Gen: Well NAD HEENT: EOMI,  MMM, dental caries throughout but area of partially retained tooth in the lower right jaw with redness.  No obvious areas of drainable abscess.  Mild general tenderness without significant swelling.      Assessment and Plan: 26 y.o. female with dental abscess.  Plan to treat with oxycodone and amoxicillin.  She will be able to see her dentist within 2-3 weeks. I encouraged her to go ahead with dental extraction as that'll be the only thing that we'll stop this pain the long-term.  Additionally I said that the urgent care in the emergency room will not  just provide pain medicines forever as she has access to a dentist and has been offered a dental extraction.  She expressed understanding and will followup with a dentist as soon as possible. Additionally warned about red flag signs or symptoms such as fever or mouth swelling.  As a side note her son who is less than 81 years old had a nursing bottle full of [facility].  I discussed that that is not an appropriate drink for a toddler and that [employer] is bad for his teeth, his overall health, and it has caffeine in it and he will have trouble sleeping tonight.  She expressed understanding and will not give him [facility] in the future.     Rodolph Bong, MD 02/16/12 Ernestina [place]

## 2012-02-16 NOTE — Discharge Instructions (Signed)
Thank you for coming in today. You need to have your tooth pulled. The urgent care and the emergency room will not just continue to prescribe the pain medicine forever, especially as you can get into the dentist office. Please take the pain medicine as needed. Please take the amoxicillin 3 times a day. I have giving you a two-week supply of both medicines. You can continue taking ibuprofen for pain. If you have a high fever or mouth swelling such that you have trouble swallowing or breathing or the pain gets very intense to the emergency room.  Please never give your child [facility].  It is terrible for his overall health and especially his teeth.  Abscessed Tooth A tooth abscess is a collection of infected fluid (pus) from a bacterial infection in the inner part of the tooth (pulp). It usually occurs at the end of the tooth's root.  CAUSES   A very bad cavity (extensive tooth decay).   Trauma to the tooth, such as a broken or chipped tooth, that allows bacteria to enter into the pulp.  SYMPTOMS  Severe pain in and around the infected tooth.   Swelling and redness around the abscessed tooth or in the mouth or face.   Tenderness.   Pus drainage.   Bad breath.   Bitter taste in the mouth.   Difficulty swallowing.   Difficulty opening the mouth.   Feeling sick to your stomach (nauseous).   Vomiting.   Chills.   Swollen neck glands.  DIAGNOSIS  A medical and dental history will be taken.   An examination will be performed by tapping on the abscessed tooth.   X-rays may be taken of the tooth to identify the abscess.  TREATMENT The goal of treatment is to eliminate the infection.   You may be prescribed antibiotic medicine to stop the infection from spreading.   A root canal may be performed to save the tooth. If the tooth cannot be saved, it may be pulled (extracted) and the abscess may be drained.  HOME CARE INSTRUCTIONS  Only take over-the-counter or  prescription medicines for pain, fever, or discomfort as directed by your caregiver.   Do not drive after taking pain medicine (narcotics).   Rinse your mouth (gargle) often with salt water ( tsp salt in 8 oz of warm water) to relieve pain or swelling.   Do not apply heat to the outside of your face.   Return to your dentist for further treatment as directed.  SEEK IMMEDIATE DENTAL CARE IF:  You have a temperature by mouth above 102 F (38.9 C), not controlled by medicine.   You have chills or a very bad headache.   You have problems breathing or swallowing.   Your have trouble opening your mouth.   You develop swelling in the neck or around the eye.   Your pain is not helped by medicine.   Your pain is getting worse instead of better.  Document Released: 11/01/2005 Document Revised: 10/21/2011 Document Reviewed: 02/09/2011 [facility] Patient Information 2012 [place], [place].

## 2012-02-16 NOTE — ED Notes (Signed)
Toothache    r  Side       Of  Face       X  3 days    Pt  Had  Problem  With this  Dental  Area   About  1  Month        Ago  She  Was  Supposed to  Have  Dental  Work        Done         But  Had   An  Infection and  It had  To  Be  Delayed        She reports  She  Is  To  Be  Rescheduled  In the  Not too  Distant  Future

## 2012-02-18 ENCOUNTER — Ambulatory Visit: Payer: Medicaid Other

## 2012-02-25 ENCOUNTER — Ambulatory Visit (INDEPENDENT_AMBULATORY_CARE_PROVIDER_SITE_OTHER): Payer: Medicaid Other | Admitting: Medical

## 2012-02-25 VITALS — BP 110/73 | HR 83 | Temp 97.7°F | Resp 12 | Ht 66.0 in

## 2012-02-25 DIAGNOSIS — Z309 Encounter for contraceptive management, unspecified: Secondary | ICD-10-CM

## 2012-02-25 DIAGNOSIS — Z3049 Encounter for surveillance of other contraceptives: Secondary | ICD-10-CM

## 2012-02-25 MED ORDER — MEDROXYPROGESTERONE ACETATE 150 MG/ML IM SUSP
150.0000 mg | INTRAMUSCULAR | Status: AC
  Administered 2012-02-25: 150 mg via INTRAMUSCULAR

## 2012-02-28 ENCOUNTER — Emergency Department (HOSPITAL_COMMUNITY)
Admission: EM | Admit: 2012-02-28 | Discharge: 2012-02-29 | Disposition: A | Discharge status: Home / Self Care | Payer: Medicaid Other | Attending: Emergency Medicine | Admitting: Emergency Medicine

## 2012-02-28 ENCOUNTER — Encounter (HOSPITAL_COMMUNITY): Payer: Self-pay | Admitting: *Deleted

## 2012-02-28 DIAGNOSIS — J45909 Unspecified asthma, uncomplicated: Secondary | ICD-10-CM | POA: Insufficient documentation

## 2012-02-28 DIAGNOSIS — R112 Nausea with vomiting, unspecified: Secondary | ICD-10-CM | POA: Insufficient documentation

## 2012-02-28 DIAGNOSIS — G43909 Migraine, unspecified, not intractable, without status migrainosus: Secondary | ICD-10-CM

## 2012-02-28 NOTE — ED Notes (Signed)
Headache for 3  Days.  With nv.  Hx of migraine headaches

## 2012-02-29 MED ORDER — KETOROLAC TROMETHAMINE 30 MG/ML IJ SOLN: 60.0000 mg | Freq: Once | INTRAMUSCULAR | Status: DC

## 2012-02-29 MED ORDER — KETOROLAC TROMETHAMINE 60 MG/2ML IM SOLN
60.0000 mg | Freq: Once | INTRAMUSCULAR | Status: AC
  Administered 2012-02-29: 60 mg via INTRAMUSCULAR

## 2012-02-29 MED ORDER — SUMATRIPTAN SUCCINATE 100 MG PO TABS: 100.0000 mg | ORAL_TABLET | ORAL | Status: DC | PRN

## 2012-02-29 MED ORDER — KETOROLAC TROMETHAMINE 60 MG/2ML IM SOLN
INTRAMUSCULAR | Status: AC
  Administered 2012-02-29: 60 mg via INTRAMUSCULAR
  Filled 2012-02-29: qty 2

## 2012-02-29 NOTE — ED Provider Notes (Signed)
Medical screening examination/treatment/procedure(s) were performed by non-physician practitioner and as supervising physician I was immediately available for consultation/collaboration.  Olivia Mackie, MD 02/29/12 [phone]

## 2012-02-29 NOTE — ED Notes (Signed)
Rx x 1, pt voiced understanding to f/u with PCP and to take rx as prescribed

## 2012-02-29 NOTE — ED Provider Notes (Signed)
History     CSN: [id number]  Arrival date & time 02/28/12  2340   First MD Initiated Contact with Patient 02/29/12 [phone]      Chief Complaint  Patient presents with  . Headache    (Consider location/radiation/quality/duration/timing/severity/associated sxs/prior treatment) HPI  26 year old female with history of migraine headache presents with chief complaints of headache. Patient states she has to prior to onset of headache affecting the top of her head and migrates to her left side of head. Headache is described as a throbbing sensation lasting for hours worsened with movement and occasionally with light and sound. States she has been taking ibuprofen at home without adequate relief. Patient thinks her headache is likely secondary to stress, or change in weather. Patient states she was nauseous and vomited once but denies any diarrhea. Patient denies fever, vision change, hearing change, sore throat, neck pain, chest pain, shortness of breath, numbness or weakness.  History reviewed. No pertinent past medical history.  Past Surgical History  Procedure Date  . Cesarean section   . Cesarean section     Family History  Problem Relation Age of Onset  . Asthma Maternal Uncle   . Cancer Maternal Grandmother     breast cancer  . Cancer Paternal Grandmother     History  Substance Use Topics  . Smoking status: Smoker, Current Status Unknown -- 0.2 packs/day for 7 years    Types: Cigarettes  . Smokeless tobacco: Never Used  . Alcohol Use: Yes    OB History    Grav Para Term Preterm Abortions TAB SAB Ect Mult Living   2    1  1   1       Review of Systems  All other systems reviewed and are negative.    Allergies  Vicodin  Home Medications   Current Outpatient Rx  Name Route Sig Dispense Refill  . AMOXICILLIN 500 MG PO CAPS Oral Take 1 capsule (500 mg total) by mouth 3 (three) times daily. 42 capsule 0  . IBUPROFEN 200 MG PO TABS Oral Take 200 mg by mouth every 6  (six) hours as needed. For pain      BP 116/70  Pulse 78  Temp(Src) 98.2 F (36.8 C) (Oral)  Resp 18  SpO2 99%  Physical Exam  Nursing note and vitals reviewed. Constitutional: She is oriented to person, place, and time. She appears well-developed and well-nourished. No distress.  HENT:  Head: Normocephalic and atraumatic.  Right Ear: External ear normal.  Left Ear: External ear normal.  Mouth/Throat: No oropharyngeal exudate.  Eyes: Conjunctivae and EOM are normal.  Neck: Normal range of motion. Neck supple. No Brudzinski's sign and no Kernig's sign noted.  Cardiovascular: Normal rate and regular rhythm.   Pulmonary/Chest: Effort normal.  Abdominal: Soft.  Musculoskeletal: Normal range of motion.  Lymphadenopathy:    She has no cervical adenopathy.  Neurological: She is alert and oriented to person, place, and time. She has normal strength. No cranial nerve deficit or sensory deficit. GCS eye subscore is 4. GCS verbal subscore is 5. GCS motor subscore is 6.  Skin: Skin is warm.  Psychiatric: She has a normal mood and affect.    ED Course  Procedures (including critical care time)  Labs Reviewed - No data to display No results found.   No diagnosis found.    MDM  Patient with headache. She has no meningismal signs, with stable normal vital signs he is afebrile. Patient was resting comfortably in bed sleeping.  She has no focal neuro deficit on exam. Plan to give her pain medication here and discharge with Imitrex. Patient voiced understanding and agrees with plan.   4:06 AM After long wait in ED ,pt sts she would prefers to be discharge after pain medication.      Fayrene Helper, PA-C 02/29/12 0406  Fayrene Helper, PA-C 02/29/12 1610

## 2012-02-29 NOTE — ED Notes (Signed)
Pt c/o HA for the past 3 days, states the pain was in the top of her head and now has moved to the left temporal region.  Worse with movement and light-photophobia.  Has been taking ibuprofen at home with no relief.  States last migraine was in 2006, was given rx for it but did not follow up.

## 2012-02-29 NOTE — Discharge Instructions (Signed)
Migraine Headache  A migraine is very bad pain on one or both sides of your head. The cause of a migraine is not always known. A migraine can be triggered or caused by different things, such as:   Alcohol.   Smoking.   Stress.   Periods (menstruation) in women.   Aged cheeses.   Foods or drinks that contain nitrates, glutamate, aspartame, or tyramine.   Lack of sleep.   Chocolate.   Caffeine.   Hunger.   Medicines, such as nitroglycerine (used to treat chest pain), birth control pills, estrogen, and some blood pressure medicines.  HOME CARE   Many medicines can help migraine pain or keep migraines from coming back. Your doctor can help you decide on a medicine or treatment program.   If you or your child gets a migraine, it may help to lie down in a dark, quiet room.   Keep a headache journal. This may help find out what is causing the headaches. For example, write down:   What you eat and drink.   How much sleep you get.   Any change to your diet or medicines.  GET HELP RIGHT AWAY IF:    The medicine does not work.   The pain begins again.   The neck is stiff.   You have trouble seeing.   The muscles are weak or you lose muscle control.   You have new symptoms.   You lose your balance.   You have trouble walking.   You feel faint or pass out.  MAKE SURE YOU:    Understand these instructions.   Will watch this condition.   Will get help right away if you are not doing well or get worse.  Document Released: 08/10/2008 Document Revised: 10/21/2011 Document Reviewed: 07/07/2009  [employer] Patient Information 2012 [employer].

## 2012-03-04 ENCOUNTER — Encounter (HOSPITAL_COMMUNITY): Payer: Self-pay | Admitting: Emergency Medicine

## 2012-03-04 ENCOUNTER — Emergency Department (HOSPITAL_COMMUNITY): Payer: Medicaid Other

## 2012-03-04 ENCOUNTER — Emergency Department (HOSPITAL_COMMUNITY)
Admission: EM | Admit: 2012-03-04 | Discharge: 2012-03-04 | Disposition: A | Discharge status: Home / Self Care | Payer: Medicaid Other | Attending: Emergency Medicine | Admitting: Emergency Medicine

## 2012-03-04 DIAGNOSIS — M549 Dorsalgia, unspecified: Secondary | ICD-10-CM

## 2012-03-04 DIAGNOSIS — R109 Unspecified abdominal pain: Secondary | ICD-10-CM | POA: Insufficient documentation

## 2012-03-04 DIAGNOSIS — R319 Hematuria, unspecified: Secondary | ICD-10-CM | POA: Insufficient documentation

## 2012-03-04 DIAGNOSIS — K5900 Constipation, unspecified: Secondary | ICD-10-CM | POA: Insufficient documentation

## 2012-03-04 DIAGNOSIS — M546 Pain in thoracic spine: Secondary | ICD-10-CM | POA: Insufficient documentation

## 2012-03-04 HISTORY — DX: Complete or unspecified spontaneous abortion without complication: O03.9

## 2012-03-04 HISTORY — DX: Renal tubulo-interstitial disease, unspecified: N15.9

## 2012-03-04 LAB — URINALYSIS, ROUTINE W REFLEX MICROSCOPIC
Bilirubin Urine: NEGATIVE
Glucose, UA: NEGATIVE mg/dL
Ketones, ur: NEGATIVE mg/dL
Nitrite: NEGATIVE
Protein, ur: NEGATIVE mg/dL
pH: 6 (ref 5.0–8.0)

## 2012-03-04 LAB — URINE MICROSCOPIC-ADD ON

## 2012-03-04 MED ORDER — OXYCODONE-ACETAMINOPHEN 5-325 MG PO TABS: 1.0000 | ORAL_TABLET | ORAL | Status: AC | PRN

## 2012-03-04 MED ORDER — DOCUSATE SODIUM 100 MG PO CAPS: 100.0000 mg | ORAL_CAPSULE | Freq: Two times a day (BID) | ORAL | Status: AC

## 2012-03-04 MED ORDER — CYCLOBENZAPRINE HCL 10 MG PO TABS: 10.0000 mg | ORAL_TABLET | Freq: Every evening | ORAL | Status: AC | PRN

## 2012-03-04 MED ORDER — IBUPROFEN 200 MG PO TABS
600.0000 mg | ORAL_TABLET | Freq: Once | ORAL | Status: AC
  Administered 2012-03-04: 600 mg via ORAL
  Filled 2012-03-04: qty 3

## 2012-03-04 NOTE — ED Notes (Signed)
Pt reports low back pain onset Wednesday. Pt denies recent injury. Reports drinking a lot of sodas and feels like kidney infection.

## 2012-03-04 NOTE — ED Provider Notes (Signed)
History     CSN: [id number]  Arrival date & time 03/04/12  0740   First Amy Mcfarland Initiated Contact with Patient 03/04/12 [phone]      Chief Complaint  Patient presents with  . Back Pain    (Consider location/radiation/quality/duration/timing/severity/associated sxs/prior treatment) HPI  25yoF h/o intermittent lower back pain pw back pain. C/O 2 days of R lower back pain, worse with movement. Occ radiation to Rt flank. Constant. 8/10 at this time. Min relief with ibuprofen and tylenol at home. Denies radiation of pain. Denies hematuria/dysuria/freq/urgency. Denies history of recent trauma/falls. Denies h/o malignancy, DM, immunocompromise  injection drug use, immunosuppression, indwelling urinary catheter, prolonged steroid use, skin or urinary tract infection. No numbness/tingling/weakness of extremities. Denies fever/chills. Denies saddle anesthesia, no urinary incontinence or retention.  States it feels similar to previous episodes of back pain but "different". Unk LMP-- depo injections   ED Notes, ED Provider Notes from 03/04/12 0000 to 03/04/12 07:48:37       Amy Tommy Rainwater, RN 03/04/2012 07:45      Pt reports low back pain onset Wednesday. Pt denies recent injury. Reports drinking a lot of sodas and feels like kidney infection.    Past Medical History  Diagnosis Date  . Migraine   . Kidney infection   . Miscarriage     Past Surgical History  Procedure Date  . Cesarean section   . Cesarean section     Family History  Problem Relation Age of Onset  . Asthma Maternal Uncle   . Cancer Maternal Grandmother     breast cancer  . Cancer Paternal Grandmother     History  Substance Use Topics  . Smoking status: Smoker, Current Status Unknown -- 0.2 packs/day for 7 years    Types: Cigarettes  . Smokeless tobacco: Never Used  . Alcohol Use: Yes    OB History    Grav Para Term Preterm Abortions TAB SAB Ect Mult Living   2    1  1   1       Review of Systems  All other  systems reviewed and are negative.  except as noted HPI   Allergies  Vicodin  Home Medications   Current Outpatient Rx  Name Route Sig Dispense Refill  . IBUPROFEN 200 MG PO TABS Oral Take 800 mg by mouth every 6 (six) hours as needed. For pain    . OXYMETAZOLINE HCL 0.05 % NA SOLN Nasal Place 2 sprays into the nose 2 (two) times daily. For sinus pain and congestion    . CYCLOBENZAPRINE HCL 10 MG PO TABS Oral Take 1 tablet (10 mg total) by mouth at bedtime as needed for muscle spasms. 20 tablet 0  . DOCUSATE SODIUM 100 MG PO CAPS Oral Take 1 capsule (100 mg total) by mouth every 12 (twelve) hours. 60 capsule 0  . OXYCODONE-ACETAMINOPHEN 5-325 MG PO TABS Oral Take 1 tablet by mouth every 4 (four) hours as needed for pain. 20 tablet 0  . SUMATRIPTAN SUCCINATE 100 MG PO TABS Oral Take 1 tablet (100 mg total) by mouth every 2 (two) hours as needed for migraine. 10 tablet 0    BP 123/86  Pulse 85  Temp(Src) 98 F (36.7 C) (Oral)  Resp 20  SpO2 99%  Physical Exam  Nursing note and vitals reviewed. Constitutional: She is oriented to person, place, and time. She appears well-developed.  HENT:  Head: Atraumatic.  Mouth/Throat: Oropharynx is clear and moist.  Eyes: Conjunctivae and EOM are normal. Pupils  are equal, round, and reactive to light.  Neck: Normal range of motion. Neck supple.  Cardiovascular: Normal rate, regular rhythm, normal heart sounds and intact distal pulses.   Pulmonary/Chest: Effort normal and breath sounds normal. No respiratory distress. She has no wheezes. She has no rales.  Abdominal: Soft. She exhibits no distension. There is no tenderness. There is no rebound and no guarding.       No cvat  Musculoskeletal: Normal range of motion.       +R thoracic paraspinal ttp No saddle anesthesia Strength 5/5 b/l LE Straight leg raise neg b/l  Neurological: She is alert and oriented to person, place, and time.  Skin: Skin is warm and dry. No rash noted.  Psychiatric:  She has a normal mood and affect.    ED Course  Procedures (including critical care time)  Labs Reviewed  URINALYSIS, ROUTINE W REFLEX MICROSCOPIC - Abnormal; Notable for the following:    Hgb urine dipstick TRACE (*)    All other components within normal limits  URINE MICROSCOPIC-ADD ON - Abnormal; Notable for the following:    Squamous Epithelial / LPF FEW (*)    All other components within normal limits  PREGNANCY, URINE   Ct Abdomen Pelvis Wo Contrast  03/04/2012  *RADIOLOGY REPORT*  Clinical Data: Back pain and lower abdominal pain for few days. Evaluate for stone.  History of C-section.  Increased bowel movements.  CT ABDOMEN AND PELVIS WITHOUT CONTRAST  Technique:  Multidetector CT imaging of the abdomen and pelvis was performed following the standard protocol without intravenous contrast.  Comparison: CT of the chest 10/13/2009  Findings: Images of the lung bases are unremarkable.  No focal abnormality is identified within the liver, spleen, pancreas, adrenal glands, or kidneys.  No intrarenal or ureteral calculi are identified.  The gallbladder is present.  The stomach and small bowel loops have a normal appearance. The appendix is well seen and has a normal appearance.  There is moderate stool throughout the proximal transverse colon.  The uterus and adnexal regions have a normal CT appearance.  Small amount of free pelvic fluid is likely physiologic. No retroperitoneal or mesenteric adenopathy. No evidence for aortic aneurysm. Visualized osseous structures have a normal appearance.  IMPRESSION:  1.  No evidence for intrarenal or ureteral calculi. 2.  No evidence for acute appendicitis. 3.  Moderate stool burden in the ascending and transverse colon.  Original Report Authenticated By: Patterson Hammersmith, M.D.    1. Constipation   2. Back pain   3. Hematuria - cause not known      MDM  PW back pain, likely msk in nature. Neuro intact. Microscopic hematuria without nephrolithiasis.  No recent illness. Doubt [facility] will have her f/u with primary care doctor for recheck. Colace, percocet, flexeril. Precautions for return.        Forbes Cellar, Amy Mcfarland 03/04/12 1035

## 2012-03-04 NOTE — ED Notes (Signed)
Pt returns from CT.

## 2012-03-04 NOTE — Discharge Instructions (Signed)
Back Pain, Adult Low back pain is very common. About 1 in 5 people have back pain.The cause of low back pain is rarely dangerous. The pain often gets better over time.About half of people with a sudden onset of back pain feel better in just 2 weeks. About 8 in 10 people feel better by 6 weeks.  CAUSES Some common causes of back pain include:  Strain of the muscles or ligaments supporting the spine.   Wear and tear (degeneration) of the spinal discs.   Arthritis.   Direct injury to the back.  DIAGNOSIS Most of the time, the direct cause of low back pain is not known.However, back pain can be treated effectively even when the exact cause of the pain is unknown.Answering your caregiver's questions about your overall health and symptoms is one of the most accurate ways to make sure the cause of your pain is not dangerous. If your caregiver needs more information, he or she may order lab work or imaging tests (X-rays or MRIs).However, even if imaging tests show changes in your back, this usually does not require surgery. HOME CARE INSTRUCTIONS For many people, back pain returns.Since low back pain is rarely dangerous, it is often a condition that people can learn to manageon their own.   Remain active. It is stressful on the back to sit or stand in one place. Do not sit, drive, or stand in one place for more than 30 minutes at a time. Take short walks on level surfaces as soon as pain allows.Try to increase the length of time you walk each day.   Do not stay in bed.Resting more than 1 or 2 days can delay your recovery.   Do not avoid exercise or work.Your body is made to move.It is not dangerous to be active, even though your back may hurt.Your back will likely heal faster if you return to being active before your pain is gone.   Pay attention to your body when you bend and lift. Many people have less discomfortwhen lifting if they bend their knees, keep the load close to their  bodies,and avoid twisting. Often, the most comfortable positions are those that put less stress on your recovering back.   Find a comfortable position to sleep. Use a firm mattress and lie on your side with your knees slightly bent. If you lie on your back, put a pillow under your knees.   Only take over-the-counter or prescription medicines as directed by your caregiver. Over-the-counter medicines to reduce pain and inflammation are often the most helpful.Your caregiver may prescribe muscle relaxant drugs.These medicines help dull your pain so you can more quickly return to your normal activities and healthy exercise.   Put ice on the injured area.   Put ice in a plastic bag.   Place a towel between your skin and the bag.   Leave the ice on for 15 to 20 minutes, 3 to 4 times a day for the first 2 to 3 days. After that, ice and heat may be alternated to reduce pain and spasms.   Ask your caregiver about trying back exercises and gentle massage. This may be of some benefit.   Avoid feeling anxious or stressed.Stress increases muscle tension and can worsen back pain.It is important to recognize when you are anxious or stressed and learn ways to manage it.Exercise is a great option.  SEEK MEDICAL CARE IF:  You have pain that is not relieved with rest or medicine.   You have   pain that does not improve in 1 week.   You have new symptoms.   You are generally not feeling well.  SEEK IMMEDIATE MEDICAL CARE IF:   You have pain that radiates from your back into your legs.   You develop new bowel or bladder control problems.   You have unusual weakness or numbness in your arms or legs.   You develop nausea or vomiting.   You develop abdominal pain.   You feel faint.  Document Released: 11/01/2005 Document Revised: 10/21/2011 Document Reviewed: 03/22/2011 [employer] Patient Information 2012 [employer].  RESOURCE GUIDE  Dental Problems  Patients with Medicaid: [facility]                     [facility] [address]                                           [address] Phone:  [phone]                                                   Phone:  [phone]  If unable to pay or uninsured, contact:  [facility] or [facility] to become qualified for the adult dental clinic.  Chronic Pain Problems Contact [facility]  [phone] Patients need to be referred by their primary care doctor.  Insufficient Money for Medicine Contact [employer]:  call "211" or [facility] 271-5999.  No Primary Care Doctor Call [facility]  832-8000 Other agencies that provide inexpensive medical care    [facility]  832-8035    [facility]  832-7272    [facility]  [phone]    [facility]  832-4777    [facility]  [phone]    [facility]  272-1050  Psych[facility] Services [facility]  832-9600 [facility]  [phone] [facility]   800 853-5163 (emergency services 641-4993)  Abuse/Neglect [place] Child Abuse Hotline (336) 641-3795 [place] Child Abuse Hotline 800-378-5315 (After Hours)  Emergency Shelter [facility] (336) 271-5985  [facility] (336) 275-9566 [facility] (704) 372-4663  MRSA Hotline #:   832-7006    [place] Resources  [facility] [place]  [employer]                           [facility] [address] [place]                     [address]         [address]                                                 [place]                                [place] Phone:  [phone]                                  Phone:  [phone]                   Phone:  [phone]  [facility] Phone:  342-8316  [place]  Child Abuse Hotline (336) 342-1394 (336) 342-3537 (After Hours)  

## 2012-03-25 ENCOUNTER — Emergency Department (HOSPITAL_COMMUNITY)
Admission: EM | Admit: 2012-03-25 | Discharge: 2012-03-25 | Disposition: A | Discharge status: Home / Self Care | Payer: Medicaid Other | Attending: Emergency Medicine | Admitting: Emergency Medicine

## 2012-03-25 ENCOUNTER — Emergency Department (HOSPITAL_COMMUNITY): Payer: Medicaid Other

## 2012-03-25 ENCOUNTER — Encounter (HOSPITAL_COMMUNITY): Payer: Self-pay | Admitting: *Deleted

## 2012-03-25 DIAGNOSIS — G43909 Migraine, unspecified, not intractable, without status migrainosus: Secondary | ICD-10-CM | POA: Insufficient documentation

## 2012-03-25 DIAGNOSIS — H53149 Visual discomfort, unspecified: Secondary | ICD-10-CM | POA: Insufficient documentation

## 2012-03-25 DIAGNOSIS — R209 Unspecified disturbances of skin sensation: Secondary | ICD-10-CM | POA: Insufficient documentation

## 2012-03-25 MED ORDER — DIPHENHYDRAMINE HCL 50 MG/ML IJ SOLN
25.0000 mg | Freq: Once | INTRAMUSCULAR | Status: AC
  Administered 2012-03-25: 25 mg via INTRAVENOUS
  Filled 2012-03-25: qty 1

## 2012-03-25 MED ORDER — DEXAMETHASONE SODIUM PHOSPHATE 10 MG/ML IJ SOLN
10.0000 mg | Freq: Once | INTRAMUSCULAR | Status: AC
  Administered 2012-03-25: 10 mg via INTRAVENOUS

## 2012-03-25 MED ORDER — DEXAMETHASONE SODIUM PHOSPHATE 10 MG/ML IJ SOLN
10.0000 mg | Freq: Once | INTRAMUSCULAR | Status: DC
  Filled 2012-03-25: qty 1

## 2012-03-25 MED ORDER — SODIUM CHLORIDE 0.9 % IV BOLUS (SEPSIS)
1000.0000 mL | Freq: Once | INTRAVENOUS | Status: AC
  Administered 2012-03-25: 1000 mL via INTRAVENOUS

## 2012-03-25 MED ORDER — METOCLOPRAMIDE HCL 5 MG/ML IJ SOLN
10.0000 mg | Freq: Once | INTRAMUSCULAR | Status: AC
  Administered 2012-03-25: 10 mg via INTRAVENOUS
  Filled 2012-03-25: qty 2

## 2012-03-25 NOTE — Discharge Instructions (Signed)
If your headache returns, try aleve for pain.  Follow up with the neurologist if your symptoms become more frequent or worsen in severity.  You should return to the ER if your pain becomes associated w/ fever or any other concerning symptoms. Migraine Headache A migraine headache is an intense, throbbing pain on one or both sides of your head. The exact cause of a migraine headache is not always known. A migraine may be caused when nerves in the brain become irritated and release chemicals that cause swelling within blood vessels, causing pain. Many migraine sufferers have a family history of migraines. Before you get a migraine you may or may not get an aura. An aura is a group of symptoms that can predict the beginning of a migraine. An aura may include:  Visual changes such as:   Flashing lights.   Bright spots or zig-zag lines.   Tunnel vision.   Feelings of numbness.   Trouble talking.   Muscle weakness.  SYMPTOMS  Pain on one or both sides of your head.   Pain that is pulsating or throbbing in nature.   Pain that is severe enough to prevent daily activities.   Pain that is aggravated by any daily physical activity.   Nausea (feeling sick to your stomach), vomiting, or both.   Pain with exposure to bright lights, loud noises, or activity.   General sensitivity to bright lights or loud noises.  MIGRAINE TRIGGERS Examples of triggers of migraine headaches include:   Alcohol.   Smoking.   Stress.   It may be related to menses (female menstruation).   Aged cheeses.   Foods or drinks that contain nitrates, glutamate, aspartame, or tyramine.   Lack of sleep.   Chocolate.   Caffeine.   Hunger.   Medications such as nitroglycerine (used to treat chest pain), birth control pills, estrogen, and some blood pressure medications.  DIAGNOSIS  A migraine headache is often diagnosed based on:  Symptoms.   Physical examination.   A computerized X-ray scan (computed  tomography, CT) of your head.  TREATMENT  Medications can help prevent migraines if they are recurrent or should they become recurrent. Your caregiver can help you with a medication or treatment program that will be helpful to you.   Lying down in a dark, quiet room may be helpful.   Keeping a headache diary may help you find a trend as to what may be triggering your headaches.  SEEK IMMEDIATE MEDICAL CARE IF:   You have confusion, personality changes or seizures.   You have headaches that wake you from sleep.   You have an increased frequency in your headaches.   You have a stiff neck.   You have a loss of vision.   You have muscle weakness.   You start losing your balance or have trouble walking.   You feel faint or pass out.  MAKE SURE YOU:   Understand these instructions.   Will watch your condition.   Will get help right away if you are not doing well or get worse.  Document Released: 11/01/2005 Document Revised: 10/21/2011 Document Reviewed: 06/17/2009 [facility] Patient Information 2012 [place], [place].

## 2012-03-25 NOTE — ED Provider Notes (Signed)
History     CSN: [id number]  Arrival date & time 03/25/12  7829   First MD Initiated Contact with Patient 03/25/12 1939      Chief Complaint  Patient presents with  . Migraine    (Consider location/radiation/quality/duration/timing/severity/associated sxs/prior treatment) HPI History provided by pt.   Pt has h/o migraine.  Has been experiencing severe, squeezing pain at right temporal region since 5pm.  No relief w/ ibuprofen.  Associated w/ photo/phonophobia and N/V.  Denies fever, vision changes and dizziness.  No recent head trauma.  Current pain is typical but occurs very infrequently.  Most recently one month ago but several years before that.  Has never had a CT of the brain or evaluation by a neurologist.  Between headaches, occasionally experiences numbness in pinky fingers and toes but otherwise no neurologic symptoms.    Past Medical History  Diagnosis Date  . Migraine   . Kidney infection   . Miscarriage     Past Surgical History  Procedure Date  . Cesarean section   . Cesarean section     Family History  Problem Relation Age of Onset  . Asthma Maternal Uncle   . Cancer Maternal Grandmother     breast cancer  . Cancer Paternal Grandmother     History  Substance Use Topics  . Smoking status: Smoker, Current Status Unknown -- 0.2 packs/day for 7 years    Types: Cigarettes  . Smokeless tobacco: Never Used  . Alcohol Use: Yes    OB History    Grav Para Term Preterm Abortions TAB SAB Ect Mult Living   2    1  1   1       Review of Systems  All other systems reviewed and are negative.    Allergies  Vicodin  Home Medications   Current Outpatient Rx  Name Route Sig Dispense Refill  . HYDROCODONE-ACETAMINOPHEN 5-325 MG PO TABS Oral Take 1 tablet by mouth every 6 (six) hours as needed. For pain    . IBUPROFEN 200 MG PO TABS Oral Take 800 mg by mouth every 6 (six) hours as needed. For pain    . SUMATRIPTAN SUCCINATE 100 MG PO TABS Oral Take 1 tablet  (100 mg total) by mouth every 2 (two) hours as needed for migraine. 10 tablet 0    BP 133/84  Temp(Src) 97.7 F (36.5 C) (Oral)  Resp 18  SpO2 100%  Physical Exam  Nursing note and vitals reviewed. Constitutional: She is oriented to person, place, and time. She appears well-developed and well-nourished. No distress.       Pt hiding her eyes from the light  HENT:  Head: Normocephalic and atraumatic.  Eyes:       Normal appearance  Neck: Normal range of motion.       No meningeal signs  Cardiovascular: Normal rate, regular rhythm and intact distal pulses.   Pulmonary/Chest: Effort normal and breath sounds normal.  Musculoskeletal: Normal range of motion.  Neurological: She is alert and oriented to person, place, and time. No sensory deficit. Coordination normal.       CN 3-12 intact.  No nystagmus. 5/5 and equal upper and lower extremity strength.  No past pointing.     Skin: Skin is warm and dry. No rash noted.  Psychiatric: She has a normal mood and affect. Her behavior is normal.    ED Course  Procedures (including critical care time)  Labs Reviewed - No data to display Ct Head Wo Contrast  03/25/2012  *RADIOLOGY REPORT*  Clinical Data: Headache.  CT HEAD WITHOUT CONTRAST  Technique:  Contiguous axial images were obtained from the base of the skull through the vertex without contrast.  Comparison: None.  Findings: The brain appears normal evidence of infarction, hemorrhage, mass lesion, mass effect, midline shift or abnormal extra-axial fluid collection.  No hydrocephalus or pneumocephalus. The calvarium is intact.  Imaged paranasal sinuses and mastoid air cells are clear.  IMPRESSION: Negative exam.  Original Report Authenticated By: Bernadene Bell. D'ALESSIO, M.D.     1. Migraine       MDM  25yo healthy F presents w/ non-traumatic right temporal headache since 5pm today.  Has had multiple similar headaches in the past but has never been evaluated w/ CT and is requesting one  today.   On exam, pt well-appearing, afebrile, no focal neuro deficits or meningeal signs.  IV NS, reglan, decadron and benadryl ordered.  CT head pending.  Will reassess shortly.   8:10 PM   CT head neg.  Pt reports that her symptoms are resolved and she would like to go home.  Recommended aleve for future headache pain and referred to neuro for worsening or increased frequency of headaches.  Return precautions discussed. 9:36 PM           Otilio Miu, [place] 03/25/12 2136

## 2012-03-25 NOTE — ED Notes (Signed)
Patient with h/a migraine h/a.  States that her headache started around 5pm.  She took ibuprofen and it didn't work

## 2012-03-25 NOTE — ED Notes (Signed)
Pt is requesting to leave and denies any pain or nausea at this time.

## 2012-03-25 NOTE — ED Notes (Signed)
Pt states that she experienced migraines in 2007 and hadn't experienced and migraines since then, except for 1 month prior.  Pt is noise, light and increase pain and nausea.  Pt with emesis x 2 today.  Denies changes in vision.

## 2012-03-26 NOTE — ED Provider Notes (Signed)
Medical screening examination/treatment/procedure(s) were performed by non-physician practitioner and as supervising physician I was immediately available for consultation/collaboration.   Carleene Cooper III, MD 03/26/12 1324

## 2012-04-22 ENCOUNTER — Emergency Department (HOSPITAL_COMMUNITY)
Admission: EM | Admit: 2012-04-22 | Discharge: 2012-04-22 | Disposition: A | Discharge status: Home / Self Care | Payer: Medicaid Other | Attending: Emergency Medicine | Admitting: Emergency Medicine

## 2012-04-22 ENCOUNTER — Encounter (HOSPITAL_COMMUNITY): Payer: Self-pay

## 2012-04-22 DIAGNOSIS — J329 Chronic sinusitis, unspecified: Secondary | ICD-10-CM

## 2012-04-22 DIAGNOSIS — F17200 Nicotine dependence, unspecified, uncomplicated: Secondary | ICD-10-CM | POA: Insufficient documentation

## 2012-04-22 MED ORDER — AMOXICILLIN 500 MG PO CAPS: 500.0000 mg | ORAL_CAPSULE | Freq: Three times a day (TID) | ORAL | Status: AC

## 2012-04-22 MED ORDER — ANTIPYRINE-BENZOCAINE 5.4-1.4 % OT SOLN
3.0000 [drp] | Freq: Once | OTIC | Status: AC
  Administered 2012-04-22: 4 [drp] via OTIC
  Filled 2012-04-22: qty 10

## 2012-04-22 NOTE — ED Notes (Signed)
Pt c/o left ear pain x7days and left eye pain and blurriness x1day. Pt states "My ear feel like it got a pulse." Pt in NAD.

## 2012-04-22 NOTE — Discharge Instructions (Signed)
Use Afrin nasal spray twice a day to help with nasal congestion. Use the eardrops every 2 hours as needed for pain. Take ibuprofen every 8 hours for pain. See the Dr. of your choice for problems.   Sinusitis Sinusitis an infection of the air pockets (sinuses) in your face. This can cause puffiness (swelling). It can also cause drainage from your sinuses.  HOME CARE   Only take medicine as told by your doctor.   Drink enough fluids to keep your pee (urine) clear or pale yellow.   Apply moist heat or ice packs for pain relief.   Use salt (saline) nose sprays. The spray will wet the thick fluid in the nose. This can help the sinuses drain.  GET HELP RIGHT AWAY IF:   You have a fever.   Your baby is older than 3 months with a rectal temperature of 102 F (38.9 C) or higher.   Your baby is 53 months old or younger with a rectal temperature of 100.4 F (38 C) or higher.   The pain gets worse.   You get a very bad headache.   You keep throwing up (vomiting).   Your face gets puffy.  MAKE SURE YOU:   Understand these instructions.   Will watch your condition.   Will get help right away if you are not doing well or get worse.  Document Released: 04/19/2008 Document Revised: 10/21/2011 Document Reviewed: 04/19/2008 [facility] Patient Information 2012 [place], [place].  RESOURCE GUIDE  Chronic Pain Problems: Contact [facility]  [phone] Patients need to be referred by their primary care doctor.  Insufficient Money for Medicine: Contact United Way:  call "211" or [facility] [phone].  No Primary Care Doctor: - Call Health Connect  [phone] - can help you locate a primary care doctor that  accepts your insurance, provides certain services, etc. - Physician Referral [phone]  Agencies that provide inexpensive medical care: - [facility]  [phone] - [facility]  [phone] - [facility]  [phone] [facility]  [phone] - [facility]  [phone] [facility]  [phone]  Medicaid-accepting [facility] Providers: - [facility]- [address]  [phone], Mon-Fri 9am-7pm, Sat 9am-1pm - [facility]- [address] [place], [address] [place]  [phone] - [facility]- [address] [place]  [phone] [facility]- [address]  [phone] - Renaye Rakers- [address] [place], [address], [phone]  Only accepts [place] patients after they have their name  applied to their card  Self Pay (no insurance) in [place]: - Sickle Cell Patients: Dr Willey Blade, [facility]  [address] [place], [phone] - [facility]- [address] [place]  [phone]       [facility]- [address]       -     [facility]- see information above (Speak to [employer] if you do not have insurance)       -  [facility]- [address] [place], [phone]       -  [facility]- [address],  [phone]       -  [facility]- [address] [phone]       -  Dr Julio Sicks-  [address], [place], [phone]       -  [facility]- [address], [phone]       -  [facility]- [address], [phone], also [address] [phone]       -    [facility]- [address] [place], [phone], 1st & 3rd Saturday   every month, 10am-1pm  1) Find a Doctor and Pay Out of Pocket Although you won't have to find out who is covered by your insurance plan, it is a good idea to ask around and get recommendations. You will then need to call the office and see if the doctor you have chosen will accept you as a new patient and what types of options they offer for patients who are self-pay. Some doctors offer discounts or will  set up payment plans for their patients who do not have insurance, but you will need to ask so you aren't surprised when you get to your appointment.  2) Contact Your Local Health Department Not all health departments have doctors that can see patients for sick visits, but many do, so it is worth a call to see if yours does. If you don't know where your local health department is, you can check in your phone book. The CDC also has a tool to help you locate your state's health department, and many state websites also have listings of all of their local health departments.  3) Find a Walk-in Clinic If your illness is not likely to be very severe or complicated, you may want to try a walk in clinic. These are popping up all over the country in pharmacies, drugstores, and shopping centers. They're usually staffed by nurse practitioners or physician assistants that have been trained to treat common illnesses and complaints. They're usually fairly quick and inexpensive. However, if you have serious medical issues or chronic medical problems, these are probably not your best option  STD Testing - [facility] Department of [facility] [place], STD Clinic, [address] [place], phone [phone] or [phone].  Monday - Friday, call for an appointment. [facility] Department of [employer], STD Clinic, [address], [place], phone [phone] or [phone].  Monday - Friday, call for an appointment.  Abuse/Neglect: [facility] Child Abuse Hotline (814)024-8197 [facility] Child Abuse Hotline (848) 377-1101 (After Hours)  Emergency Shelter:  [facility] (623) 818-3250  Maternity Homes: - [facility] [phone] - [facility] ([phone] [phone]  MRSA Hotline #:   541 369 1442  [facility] Resources  [facility] [place]  United Way [facility] Dept. [address]                  [address]         [address] [place] [address]  [place]                                               Cristobal Goldmann Phone:  [phone]  Phone:  [phone]                   Phone:  [phone]  [facility], [phone] - [facility] - [phone]       -     [facility] in [place], [address]                                  [phone], [facility] Child Abuse Hotline 3042797556 or [phone] (After Hours)   Behavioral Health Services  Substance Abuse Resources: - [facility] Drug Services  719-041-2978 - [facility] 8146333217 - [facility] [place] [phone] Floydene Flock [phone] - Residential & Outpatient Substance Abuse Program  [phone]  Psychological Services: [facility]  [phone] [facility] Services  [phone] - [facility], [phone] [place]. [address] [place], ACCESS LINE: (760)388-5307 or [phone], [employer].za  Dental Assistance  If unable to pay or uninsured, contact:  [facility] or [place] [facility]. to become qualified for the adult dental clinic.  Patients with Medicaid: [facility] [phone] [address], [phone] [address] [phone]  If unable to pay, or uninsured, contact [facility] (810)035-4647) or [facility] Department [phone] in [place], [phone] in [facility]) to become qualified for the adult dental clinic  Other Low-Cost Community Dental Services: - [facility]- [address] [place], [place], [place], [address], [phone], Ext. [address], 2nd and 4th Thursday of the month at 6:30am.  10 clients each day by appointment, can sometimes see walk-in patients if someone does not show for an appointment. [facility]-  [address] [place] Griffins [place], [place], [address], [phone] - [facility]- [address], [place], [place], [address], [phone] - [facility]- [phone] [facility]- [phone] [facility] Department- [phone]

## 2012-04-22 NOTE — ED Provider Notes (Signed)
History   This chart was scribed for Flint Melter, MD by Shari Heritage. The patient was seen in room STRE2/STRE2. Patient's care was started at 1502.     CSN: [id number]  Arrival date & time 04/22/12  1502   First MD Initiated Contact with Patient 04/22/12 1609      Chief Complaint  Patient presents with  . Otalgia  . Eye Pain    (Consider location/radiation/quality/duration/timing/severity/associated sxs/prior treatment) Patient is a 26 y.o. female presenting with ear pain and eye pain. The history is provided by the patient. No language interpreter was used.  Otalgia  Eye Pain   Amy Mcfarland is a 26 y.o. female who presents to the Emergency Department complaining of moderate to severe, constant otalgia in left ear onset about 1 week ago. Patient also complains of pressure and pain behind her left eye. Patient has been taking Ibuprofen with minimal relief. Patient with h/o of migraines, kidney infection and miscarriage. Patient is a smoker.  Past Medical History  Diagnosis Date  . Migraine   . Kidney infection   . Miscarriage     Past Surgical History  Procedure Date  . Cesarean section   . Cesarean section     Family History  Problem Relation Age of Onset  . Asthma Maternal Uncle   . Cancer Maternal Grandmother     breast cancer  . Cancer Paternal Grandmother     History  Substance Use Topics  . Smoking status: Smoker, Current Status Unknown -- 0.2 packs/day for 7 years    Types: Cigarettes  . Smokeless tobacco: Never Used  . Alcohol Use: Yes    OB History    Grav Para Term Preterm Abortions TAB SAB Ect Mult Living   2    1  1   1       Review of Systems  HENT: Positive for ear pain.   Eyes: Positive for pain.   A complete 10 system review of systems was obtained and all systems are negative except as noted in the HPI and PMH.   Allergies  Peach; Strawberry; and Vicodin  Home Medications   Current Outpatient Rx  Name Route Sig Dispense  Refill  . ACETAMINOPHEN 500 MG PO TABS Oral Take 1,000 mg by mouth every 6 (six) hours as needed. For pain    . IBUPROFEN 200 MG PO TABS Oral Take 800 mg by mouth every 6 (six) hours as needed. For pain    . SUMATRIPTAN SUCCINATE 100 MG PO TABS Oral Take 1 tablet (100 mg total) by mouth every 2 (two) hours as needed for migraine. 10 tablet 0  . AMOXICILLIN 500 MG PO CAPS Oral Take 1 capsule (500 mg total) by mouth 3 (three) times daily. 21 capsule 0    BP 119/77  Pulse 80  Temp(Src) 98.1 F (36.7 C) (Oral)  Resp 22  SpO2 100%  Physical Exam  Nursing note and vitals reviewed. Constitutional: She is oriented to person, place, and time. She appears well-developed and well-nourished. No distress.  HENT:  Head: Normocephalic and atraumatic.  Right Ear: Tympanic membrane, external ear and ear canal normal.  Left Ear: Tympanic membrane, external ear and ear canal normal.       Percussive tenderness over left maxillary and left frontal sinuses.  Eyes: Conjunctivae and EOM are normal.  Neck: Neck supple. No tracheal deviation present.  Cardiovascular: Normal rate.   Pulmonary/Chest: Effort normal. No respiratory distress.  Abdominal: She exhibits no distension.  Musculoskeletal: Normal  range of motion.  Neurological: She is alert and oriented to person, place, and time. No sensory deficit.  Skin: Skin is dry.  Psychiatric: She has a normal mood and affect. Her behavior is normal.    ED Course  Procedures (including critical care time) DIAGNOSTIC STUDIES: Oxygen Saturation is 100% on room air, normal by my interpretation.    COORDINATION OF CARE: 4:14PM - Patient informed of current plan for treatment and evaluation and agrees with plan at this time. Suggested that patient use Afrin nasal spray and Ibuprofen or Tylenol to relieve pain. Will prescribe ear drops for otalgia.   Labs Reviewed - No data to display No results found.   1. Sinusitis       MDM  Eval most consistent  with sinusitis. Doubt otitis media , external otitis, or mastoiditis.Doubt metabolic instability, serious bacterial infection or impending vascular collapse; the patient is stable for discharge.     I personally performed the services described in this documentation, which was scribed in my presence. The recorded information has been reviewed and considered.   Plan: Home Medications- Amox, Auralgan; Home Treatments- rest; Recommended follow up- PCP prn  Flint Melter, MD 04/22/12 2051

## 2012-04-22 NOTE — ED Notes (Signed)
Pt d/c home in NAD. Pt voiced understanding of d/c instructions and follow up care. Pt states that her ear feels much better after ear drops.

## 2012-04-28 ENCOUNTER — Encounter (HOSPITAL_COMMUNITY): Payer: Self-pay | Admitting: *Deleted

## 2012-04-28 ENCOUNTER — Emergency Department (INDEPENDENT_AMBULATORY_CARE_PROVIDER_SITE_OTHER)
Admission: EM | Admit: 2012-04-28 | Discharge: 2012-04-28 | Disposition: A | Discharge status: Home / Self Care | Payer: Self-pay | Source: Home / Self Care | Attending: Emergency Medicine | Admitting: Emergency Medicine

## 2012-04-28 DIAGNOSIS — K0401 Reversible pulpitis: Secondary | ICD-10-CM

## 2012-04-28 MED ORDER — DICLOFENAC SODIUM 75 MG PO TBEC: 75.0000 mg | DELAYED_RELEASE_TABLET | Freq: Two times a day (BID) | ORAL | Status: DC

## 2012-04-28 MED ORDER — BENZONATATE 200 MG PO CAPS: ORAL_CAPSULE | ORAL | Status: DC

## 2012-04-28 MED ORDER — OXYCODONE-ACETAMINOPHEN 5-325 MG PO TABS: ORAL_TABLET | ORAL | Status: AC

## 2012-04-28 NOTE — ED Notes (Signed)
Pt  Reports  She  Has  Toothache  On  Both  Sides       For  sev  Weeks  The  l  Side  She  Reports it   Is  Causing  An  Earache   -  She  Reports  She  Has  Had  Dental problems  In past  But  Did  Not see  The  Dentist     -  She  Claims  Medicaid  Issues    She  Is  Sitting upright on  Exam table  In no  Severe  Distress

## 2012-04-28 NOTE — Discharge Instructions (Signed)
Look up the [place] [employer]'s [facility] for free dental clinics. Http://www.ncdental.org/ncds/Schedule.asp ° °Get there early and be prepared to wait. [employer] and [employer] have dental hygienist schools that provide low cost routine dental care.  ° °Other resources: °[facility] °[address] °[place], [place] °([phone] ° °Patients with Medicaid: °[facility]                     [facility] °[address]                                [address] °Phone:  [phone]                                                  Phone:  [phone] ° °If unable to pay or uninsured, contact:  [facility] or [facility] to become qualified for the adult dental clinic. ° °No matter what dental problem you have, it will not get better unless you get good dental care.  If the tooth is not taken care of, your symptoms will come back in time and you will be visiting us again in the Urgent Care Center with a bad toothache.  So, see your dentist as soon as possible.  If you don't have a dentist, we can give you a list of dentists.  Sometimes the most cost effective treatment is removal of the tooth.  This can be done very inexpensively through one of the low cost Affordable Denture Centers such as the facility on [address] in [place] ([phone]).  The downside to this is that you will have one less tooth and this can effect your ability to chew. ° °Some other things that can be done for a dental infection include the following: ° °· Rinse your mouth out with hot salt water (1/2 tsp of table salt and a pinch of baking soda in 8 oz of hot water).  You can do this every 2 or 3 hours. °· Avoid cold foods, beverages, and cold air.  This will make your symptoms worse. °· Sleep with your head elevated.  Sleeping flat will cause your gums and oral tissues to swell and make them hurt more.  You can sleep on several pillows.  Even  better is to sleep in a recliner with your head higher than your heart. °· For mild to moderate pain, you can take Tylenol, ibuprofen, or Aleve. °· External application of heat by a heating pad, hot water bottle, or hot wet towel can help with pain and speed healing.  You can do this every 2 to 3 hours. Do not fall asleep on a heating pad since this can cause a burn.  °·  °

## 2012-04-28 NOTE — ED Provider Notes (Signed)
Chief Complaint  Patient presents with  . Dental Pain    History of Present Illness:   The patient is a 26 year old female who comes in today with multiple carious teeth. She has pain in the left lower and right upper molars with swelling of the gingiva and some purulent drainage. This is been going on for about 2 weeks. She denies any fever or chills. She's had no headache, facial pain, or eye pain. She has had some left ear pain for the past 2 weeks. She went to the emergency room this week because of the earache was told she had a sinus infection and was placed on amoxicillin. She doesn't feel any better. She denies any difficulty swallowing or breathing. It hurts to chew on both sides. She denies any shortness of breath, coughing, or wheezing.  Review of Systems:  Other than noted above, the patient denies any of the following symptoms: Systemic:  No fever, chills, sweats or weight loss. ENT:  No headache, ear ache, sore throat, nasal congestion, facial pain, or swelling. Lymphatic:  No adenopathy. Lungs:  No coughing, wheezing or shortness of breath.  PMFSH:  Past medical history, family history, social history, meds, and allergies were reviewed.  Physical Exam:   Vital signs:  BP 99/66  Pulse 97  Temp 98.5 F (36.9 C) (Oral)  Resp 18  SpO2 100% General:  Alert, oriented, in no distress. ENT:  TMs and canals normal.  Nasal mucosa normal. Mouth exam:   She has multiple carious teeth and is hard to say which ones are the culprit lesions. She has a left lower first molar that is decayed down to the gumline and the pulp is exposed. She has several teeth in the right upper dentition that are equally decayed. There is no swelling of the gingiva. Throat is clear. No swelling of the floor the mouth. Neck:  No swelling or adenopathy. Lungs:  Breath sounds clear and equal bilaterally.  No wheezes, rales or rhonchi. Heart:  Regular rhythm.  No gallops or murmers. Skin:  Clear, warm and  dry.  Assessment:  The encounter diagnosis was Pulpitis.  Plan:   1.  The following meds were prescribed:   New Prescriptions   BENZONATATE (TESSALON) 200 MG CAPSULE    Apply 1 drop of medication to affected tooth every 8 hours as needed.   DICLOFENAC (VOLTAREN) 75 MG EC TABLET    Take 1 tablet (75 mg total) by mouth 2 (two) times daily.   OXYCODONE-ACETAMINOPHEN (PERCOCET) 5-325 MG PER TABLET    1 to 2 tablets every 6 hours as needed for pain.   2.  The patient was instructed in symptomatic care and handouts were given. 3.  The patient was told to return if becoming worse in any way, if no better in 3 or 4 days, and given some red flag symptoms that would indicate earlier return, especially difficulty breathing. 4.  The patient was told to follow up with a dentist as soon as possible.    Reuben Likes, MD 04/28/12 [phone]

## 2012-05-15 ENCOUNTER — Ambulatory Visit: Payer: Medicaid Other

## 2012-05-21 ENCOUNTER — Encounter (HOSPITAL_COMMUNITY): Payer: Self-pay | Admitting: Emergency Medicine

## 2012-05-21 ENCOUNTER — Emergency Department (HOSPITAL_COMMUNITY)
Admission: EM | Admit: 2012-05-21 | Discharge: 2012-05-22 | Disposition: A | Discharge status: Home / Self Care | Payer: Medicaid Other | Attending: Emergency Medicine | Admitting: Emergency Medicine

## 2012-05-21 DIAGNOSIS — Z91018 Allergy to other foods: Secondary | ICD-10-CM | POA: Insufficient documentation

## 2012-05-21 DIAGNOSIS — K089 Disorder of teeth and supporting structures, unspecified: Secondary | ICD-10-CM | POA: Insufficient documentation

## 2012-05-21 DIAGNOSIS — K0889 Other specified disorders of teeth and supporting structures: Secondary | ICD-10-CM

## 2012-05-21 DIAGNOSIS — F17200 Nicotine dependence, unspecified, uncomplicated: Secondary | ICD-10-CM | POA: Insufficient documentation

## 2012-05-21 DIAGNOSIS — Z803 Family history of malignant neoplasm of breast: Secondary | ICD-10-CM | POA: Insufficient documentation

## 2012-05-21 DIAGNOSIS — H9209 Otalgia, unspecified ear: Secondary | ICD-10-CM | POA: Insufficient documentation

## 2012-05-21 MED ORDER — BUPIVACAINE HCL (PF) 0.5 % IJ SOLN
10.0000 mL | Freq: Once | INTRAMUSCULAR | Status: AC
  Administered 2012-05-21: 10 mL

## 2012-05-21 MED ORDER — OXYCODONE-ACETAMINOPHEN 5-325 MG PO TABS: 1.0000 | ORAL_TABLET | Freq: Four times a day (QID) | ORAL | Status: AC | PRN

## 2012-05-21 NOTE — ED Provider Notes (Addendum)
History     CSN: [id number]  Arrival date & time 05/21/12  2055   None     Chief Complaint  Patient presents with  . Dental Pain  . Otalgia    (Consider location/radiation/quality/duration/timing/severity/associated sxs/prior treatment) HPI Comments: Dental pain for the past month on and off now out of pain medications Has DDS appointment in august Also having increased pain in R shoulder which is chronically painful.   The history is provided by the patient.    Past Medical History  Diagnosis Date  . Migraine   . Kidney infection   . Miscarriage     Past Surgical History  Procedure Date  . Cesarean section   . Cesarean section     Family History  Problem Relation Age of Onset  . Asthma Maternal Uncle   . Cancer Maternal Grandmother     breast cancer  . Cancer Paternal Grandmother     History  Substance Use Topics  . Smoking status: Smoker, Current Status Unknown -- 0.2 packs/day for 7 years    Types: Cigarettes  . Smokeless tobacco: Never Used  . Alcohol Use: Yes    OB History    Grav Para Term Preterm Abortions TAB SAB Ect Mult Living   2    1  1   1       Review of Systems  Constitutional: Negative for fever and chills.  HENT: Positive for dental problem.   Skin: Negative for wound.  Neurological: Negative for dizziness, weakness and numbness.    Allergies  Peach; Strawberry; and Vicodin  Home Medications   Current Outpatient Rx  Name Route Sig Dispense Refill  . ACETAMINOPHEN 500 MG PO TABS Oral Take 1,000 mg by mouth every 6 (six) hours as needed. For pain    . AMOXICILLIN 500 MG PO CAPS Oral Take 500 mg by mouth 3 (three) times daily. For tooth infection    . TESSALON PO Oral Take 1 drop by mouth every 8 (eight) hours as needed. Apply 1 drop of medication to affected tooth as needed    . DICLOFENAC SODIUM 75 MG PO TBEC Oral Take 75 mg by mouth 2 (two) times daily.    . IBUPROFEN 200 MG PO TABS Oral Take 800 mg by mouth every 6 (six) hours  as needed. For pain    . MEDROXYPROGESTERONE ACETATE 150 MG/ML IM SUSP Intramuscular Inject 150 mg into the muscle every 3 (three) months. Next dose due July, 2013    . OXYCODONE-ACETAMINOPHEN 5-325 MG PO TABS Oral Take 1 tablet by mouth every 6 (six) hours as needed. For dental pain    . SUMATRIPTAN SUCCINATE 100 MG PO TABS Oral Take 100 mg by mouth every 2 (two) hours as needed. For migraine    . OXYCODONE-ACETAMINOPHEN 5-325 MG PO TABS Oral Take 1-2 tablets by mouth every 6 (six) hours as needed for pain. 20 tablet 0    BP 135/69  Pulse 94  Temp 98.4 F (36.9 C) (Oral)  Resp 22  SpO2 100%  Physical Exam  Constitutional: She appears well-developed and well-nourished.  HENT:  Mouth/Throat:    Eyes: Pupils are equal, round, and reactive to light.  Neck: Normal range of motion.  Cardiovascular: Normal rate.   Pulmonary/Chest: Effort normal.  Musculoskeletal: She exhibits tenderness. She exhibits no edema.       Right shoulder: She exhibits decreased range of motion, tenderness and pain. She exhibits no bony tenderness, no swelling, no crepitus and no deformity.  Arms:   ED Course  NERVE BLOCK Date/Time: 05/21/2012 10:00 PM Performed by: Arman Filter Authorized by: Arman Filter Consent: Verbal consent obtained. Risks and benefits: risks, benefits and alternatives were discussed Consent given by: patient Patient understanding: patient states understanding of the procedure being performed Patient identity confirmed: verbally with patient Time out: Immediately prior to procedure a "time out" was called to verify the correct patient, procedure, equipment, support staff and site/side marked as required. Indications: pain relief Body area: face/mouth Nerve: mental Laterality: left Patient sedated: no Patient position: prone Needle gauge: 27 G Location technique: anatomical landmarks Local anesthetic: lidocaine 2% with epinephrine Outcome: pain improved Patient  tolerance: Patient tolerated the procedure well with no immediate complications.   (including critical care time)  Labs Reviewed - No data to display No results found.   1. Pain, dental       MDM  Dental pain Preformed dental block with decrease in pain         Arman Filter, NP 05/21/12 2332  Arman Filter, NP 05/21/12 2332  Arman Filter, NP 06/01/12 [phone]

## 2012-05-21 NOTE — ED Provider Notes (Signed)
Medical screening examination/treatment/procedure(s) were performed by non-physician practitioner and as supervising physician I was immediately available for consultation/collaboration.   Mykle Pascua B. Bernette Mayers, MD 05/21/12 2333

## 2012-05-21 NOTE — ED Notes (Signed)
Patient complaining of left sided dental pain; patient states that she was seen at Urgent Care last month for the same symptoms -- patient states that she is out of her pain medication.  Patient states that she is unable to see a dentist till August.  Patient also complaining of right shoulder pain; patient reports frequent pain in her right shoulder since she was young -- reports hitting shoulder on her closet door while moving furniture.  Able to move all extremities without difficulty.

## 2012-05-22 ENCOUNTER — Ambulatory Visit: Payer: Medicaid Other

## 2012-06-01 NOTE — ED Provider Notes (Signed)
Medical screening examination/treatment/procedure(s) were performed by non-physician practitioner and as supervising physician I was immediately available for consultation/collaboration.   Charles B. Bernette Mayers, MD 06/01/12 [phone]

## 2012-06-02 ENCOUNTER — Ambulatory Visit (INDEPENDENT_AMBULATORY_CARE_PROVIDER_SITE_OTHER): Payer: Medicaid Other | Admitting: Obstetrics and Gynecology

## 2012-06-02 VITALS — BP 123/72 | HR 81 | Ht 66.0 in | Wt 255.9 lb

## 2012-06-02 DIAGNOSIS — Z3049 Encounter for surveillance of other contraceptives: Secondary | ICD-10-CM

## 2012-06-02 MED ORDER — MEDROXYPROGESTERONE ACETATE 150 MG/ML IM SUSP
150.0000 mg | INTRAMUSCULAR | Status: AC
  Administered 2012-06-02: 150 mg via INTRAMUSCULAR

## 2012-06-23 ENCOUNTER — Encounter (HOSPITAL_COMMUNITY): Payer: Self-pay

## 2012-06-23 ENCOUNTER — Emergency Department (INDEPENDENT_AMBULATORY_CARE_PROVIDER_SITE_OTHER)
Admission: EM | Admit: 2012-06-23 | Discharge: 2012-06-23 | Disposition: A | Discharge status: Home / Self Care | Payer: Medicaid Other | Source: Home / Self Care | Attending: Family Medicine | Admitting: Family Medicine

## 2012-06-23 DIAGNOSIS — IMO0002 Reserved for concepts with insufficient information to code with codable children: Secondary | ICD-10-CM

## 2012-06-23 MED ORDER — TRAMADOL HCL 50 MG PO TABS: 50.0000 mg | ORAL_TABLET | Freq: Four times a day (QID) | ORAL | Status: AC | PRN

## 2012-06-23 MED ORDER — IBUPROFEN 600 MG PO TABS: 600.0000 mg | ORAL_TABLET | Freq: Three times a day (TID) | ORAL | Status: AC | PRN

## 2012-06-23 NOTE — ED Notes (Signed)
States she tripped and bumped her lower right leg 1 week ago (no break in skin) ans since then has developed pain in her upper tibia area, pain in knee w weight bearing or ROM, tightness in calf . No reported pain in foot, good DP pulse palpated, denies dyspnea

## 2012-06-23 NOTE — ED Provider Notes (Signed)
History     CSN: [id number]  Arrival date & time 06/23/12  1205   First MD Initiated Contact with Patient 06/23/12 1318      Chief Complaint  Patient presents with  . Leg Pain    (Consider location/radiation/quality/duration/timing/severity/associated sxs/prior treatment) HPI Comments: 26 year old morbidly obese female. Here complaining of right knee pain for 2 weeks. States that she was coming upstairs in her porch when tripped and fell overstretching (in flexed position) her right knee. This event happened 2 weeks ago and there was no swelling bruising or lacerations at the time. Her knee has been tender in the lower area since especially when standing or walking for prolonged periods states there's been also associated pain in her right lateral leg just below the knee. She is weightbearing. Has been taken inconsistently over-the-counter Tylenol and Motrin. Requesting "strong pain medication that can allows her to sleep". States that she's had a prescription of Percocet in the past that has helped her up with pain.   Past Medical History  Diagnosis Date  . Migraine   . Kidney infection   . Miscarriage     Past Surgical History  Procedure Date  . Cesarean section   . Cesarean section     Family History  Problem Relation Age of Onset  . Asthma Maternal Uncle   . Cancer Maternal Grandmother     breast cancer  . Cancer Paternal Grandmother     History  Substance Use Topics  . Smoking status: Smoker, Current Status Unknown -- 0.2 packs/day for 7 years    Types: Cigarettes  . Smokeless tobacco: Never Used  . Alcohol Use: Yes    OB History    Grav Para Term Preterm Abortions TAB SAB Ect Mult Living   2    1  1   1       Review of Systems  Constitutional:       10 systems reviewed and  pertinent negative and positive symptoms are as per HPI.     Musculoskeletal:       As per HPI  All other systems reviewed and are negative.    Allergies  Peach; Strawberry; and  Vicodin  Home Medications   Current Outpatient Rx  Name Route Sig Dispense Refill  . ACETAMINOPHEN 500 MG PO TABS Oral Take 1,000 mg by mouth every 6 (six) hours as needed. For pain    . MEDROXYPROGESTERONE ACETATE 150 MG/ML IM SUSP Intramuscular Inject 150 mg into the muscle every 3 (three) months. Next dose due July, 2013    . SUMATRIPTAN SUCCINATE 100 MG PO TABS Oral Take 100 mg by mouth every 2 (two) hours as needed. For migraine    . IBUPROFEN 600 MG PO TABS Oral Take 1 tablet (600 mg total) by mouth every 8 (eight) hours as needed for pain. 30 tablet 0    Take with food  . TRAMADOL HCL 50 MG PO TABS Oral Take 1 tablet (50 mg total) by mouth every 6 (six) hours as needed for pain. 15 tablet 0    BP 116/68  Pulse 84  Temp 99.2 F (37.3 C) (Oral)  Resp 16  SpO2 100%  Physical Exam  Nursing note and vitals reviewed. Constitutional: She is oriented to person, place, and time. She appears well-developed and well-nourished. No distress.       Morbidly obese  HENT:  Head: Normocephalic and atraumatic.  Cardiovascular: Normal heart sounds.   Pulmonary/Chest: Breath sounds normal.  Musculoskeletal:  Right knee: No obvious deformity. No swelling or effusion. No erythema. No focalized tenderness to palpation. Patella well aligned. FROM with normal active and passive flexion and extension. Negative drawer test. No laxity. Weight bearing.  Tibial bone and lower leg with no focal tenderness, bruising or swelling.  entire right lower extremity appears neurovascularly intact.  Neurological: She is alert and oriented to person, place, and time.  Skin: No rash noted.    ED Course  Procedures (including critical care time)  Labs Reviewed - No data to display No results found.   1. Knee sprain and strain       MDM  Impress knee sprain. No signs of contusion, swelling or effusion noted on examination. Prescribed ibuprofen 600 mg 3 times a day and tramadol 50 mg every 8 hours  when necessary. Ace wrap was applied here. Need rehabilitation exercises handout was also provided. Patient was asked to followup with orthopedic Dr. if persistent symptoms despite following treatment.        Sharin Grave, MD 06/25/12 [phone]

## 2012-07-07 ENCOUNTER — Encounter (HOSPITAL_COMMUNITY): Payer: Self-pay | Admitting: Emergency Medicine

## 2012-07-07 ENCOUNTER — Emergency Department (HOSPITAL_COMMUNITY)
Admission: EM | Admit: 2012-07-07 | Discharge: 2012-07-07 | Disposition: A | Discharge status: Home / Self Care | Payer: Medicaid Other | Attending: Emergency Medicine | Admitting: Emergency Medicine

## 2012-07-07 DIAGNOSIS — F17200 Nicotine dependence, unspecified, uncomplicated: Secondary | ICD-10-CM | POA: Insufficient documentation

## 2012-07-07 DIAGNOSIS — K029 Dental caries, unspecified: Secondary | ICD-10-CM | POA: Insufficient documentation

## 2012-07-07 MED ORDER — PENICILLIN V POTASSIUM 500 MG PO TABS: 500.0000 mg | ORAL_TABLET | Freq: Three times a day (TID) | ORAL | Status: AC

## 2012-07-07 MED ORDER — TRAMADOL HCL 50 MG PO TABS: 50.0000 mg | ORAL_TABLET | Freq: Four times a day (QID) | ORAL | Status: AC | PRN

## 2012-07-07 NOTE — ED Provider Notes (Signed)
History     CSN: [id number]  Arrival date & time 07/07/12  0726   First MD Initiated Contact with Patient 07/07/12 [phone]      Chief Complaint  Patient presents with  . Dental Pain    (Consider location/radiation/quality/duration/timing/severity/associated sxs/prior treatment) Patient is a 26 y.o. female presenting with tooth pain. The history is provided by the patient.  Dental PainThe primary symptoms include mouth pain. Primary symptoms do not include fever or sore throat.  Associated symptoms comments: Sharp upper right toothache since 2 days ago when eating cold foods. No fever or facial swelling..    Past Medical History  Diagnosis Date  . Migraine   . Kidney infection   . Miscarriage     Past Surgical History  Procedure Date  . Cesarean section   . Cesarean section     Family History  Problem Relation Age of Onset  . Asthma Maternal Uncle   . Cancer Maternal Grandmother     breast cancer  . Cancer Paternal Grandmother     History  Substance Use Topics  . Smoking status: Smoker, Current Status Unknown -- 0.2 packs/day for 7 years    Types: Cigarettes  . Smokeless tobacco: Never Used  . Alcohol Use: Yes    OB History    Grav Para Term Preterm Abortions TAB SAB Ect Mult Living   2    1  1   1       Review of Systems  Constitutional: Negative for fever.  HENT: Positive for dental problem. Negative for sore throat.     Allergies  Peach; Strawberry; and Vicodin  Home Medications   Current Outpatient Rx  Name Route Sig Dispense Refill  . IBUPROFEN 200 MG PO TABS Oral Take 800 mg by mouth every 6 (six) hours as needed. For pain    . SUMATRIPTAN SUCCINATE 100 MG PO TABS Oral Take 100 mg by mouth every 2 (two) hours as needed. For migraine    . MEDROXYPROGESTERONE ACETATE 150 MG/ML IM SUSP Intramuscular Inject 150 mg into the muscle every 3 (three) months. Next dose due July, 2013      BP 127/44  Pulse 91  Temp 98.3 F (36.8 C) (Oral)  Resp 18   SpO2 99%  Physical Exam  Constitutional: She appears well-developed and well-nourished. No distress.  HENT:  Head: Normocephalic.       Poor dentition with significant decay of upper right 1st, 2nd, 3rd molars.   Neck: Normal range of motion.  Pulmonary/Chest: Effort normal.  Lymphadenopathy:    She has no cervical adenopathy.    ED Course  Procedures (including critical care time)  Labs Reviewed - No data to display No results found.   No diagnosis found. 1. Dental caries    MDM  Uncomplicated dental pain in the setting of multiple caries.        Rodena Medin, PA-C 07/07/12 [phone]

## 2012-07-07 NOTE — ED Notes (Signed)
Pt has appt on Sept to have teeth on top right to have teeth pulled.  Woke on Wednesday morning by pain 8/10 on right lower teeth.  Pt has had abcess on those teeth in past.   Seen in ED 2 months ago for same.  Pt alert oriented X4

## 2012-07-07 NOTE — ED Notes (Signed)
Pt c/o right upper dental pain x 2 days.  ?

## 2012-07-08 NOTE — ED Provider Notes (Signed)
Medical screening examination/treatment/procedure(s) were performed by non-physician practitioner and as supervising physician I was immediately available for consultation/collaboration.   Carleene Cooper III, MD 07/08/12 [phone]

## 2012-08-18 ENCOUNTER — Ambulatory Visit: Payer: Medicaid Other

## 2012-08-21 ENCOUNTER — Encounter: Payer: Self-pay | Admitting: Family Medicine

## 2012-08-21 ENCOUNTER — Ambulatory Visit (INDEPENDENT_AMBULATORY_CARE_PROVIDER_SITE_OTHER): Payer: Medicaid Other | Admitting: Family Medicine

## 2012-08-21 VITALS — BP 120/85 | HR 94 | Ht 66.0 in | Wt 255.0 lb

## 2012-08-21 DIAGNOSIS — M546 Pain in thoracic spine: Secondary | ICD-10-CM

## 2012-08-21 MED ORDER — CYCLOBENZAPRINE HCL 5 MG PO TABS: ORAL_TABLET | ORAL | Status: DC

## 2012-08-21 MED ORDER — DICLOFENAC SODIUM 75 MG PO TBEC: DELAYED_RELEASE_TABLET | ORAL | Status: DC

## 2012-08-21 NOTE — Progress Notes (Signed)
  Subjective:    Patient ID: Amy Mcfarland, female    DOB: Jan 05, 1986, 26 y.o.   MRN: [id number]  HPI  Right-sided mid back pain for about a week. She noticed this initially after she lifted a heavy dresser at her home. She lifted that she felt a little bit of a pop and then some pain but it got better. The next day she started having intermittent spasm type catching pain.  She's a little worried that Kidney infection in the past and she her in that area. She's denies any urinary frequency or burning, has had no fever or nausea. Admits the pain is a little different this time and that it is intermittent whereas when she had a kidney infection it was constant and worsening.  Review of Systems No shortness of breath. No fever. Please see history of present illness above for additional partner review of systems.    Objective:   Physical Exam Vital signs are reviewed. GENERAL: Well-developed overweight female no acute distress BACK: Tender to palpation across the mid thoracic posterior rib area and palpation here reproduces her pain. We can also reproduce her pain by having her lean forward and rotate to the left. Percussion across the CV angle bilaterally is painless. Abdominal exam is normal.       Assessment & Plan:  #1. Strain muscle in the right mid back. Will use diclofenac, stretching exercises and some muscle relaxers. She's not improving in about a week she'll let me know.

## 2012-08-22 ENCOUNTER — Emergency Department (HOSPITAL_COMMUNITY)
Admission: EM | Admit: 2012-08-22 | Discharge: 2012-08-22 | Disposition: A | Discharge status: Home / Self Care | Payer: Medicaid Other | Attending: Emergency Medicine | Admitting: Emergency Medicine

## 2012-08-22 ENCOUNTER — Encounter (HOSPITAL_COMMUNITY): Payer: Self-pay | Admitting: Physical Medicine and Rehabilitation

## 2012-08-22 DIAGNOSIS — F17200 Nicotine dependence, unspecified, uncomplicated: Secondary | ICD-10-CM | POA: Insufficient documentation

## 2012-08-22 DIAGNOSIS — N390 Urinary tract infection, site not specified: Secondary | ICD-10-CM | POA: Insufficient documentation

## 2012-08-22 DIAGNOSIS — Z809 Family history of malignant neoplasm, unspecified: Secondary | ICD-10-CM | POA: Insufficient documentation

## 2012-08-22 DIAGNOSIS — Z885 Allergy status to narcotic agent status: Secondary | ICD-10-CM | POA: Insufficient documentation

## 2012-08-22 DIAGNOSIS — Z825 Family history of asthma and other chronic lower respiratory diseases: Secondary | ICD-10-CM | POA: Insufficient documentation

## 2012-08-22 DIAGNOSIS — Z91018 Allergy to other foods: Secondary | ICD-10-CM | POA: Insufficient documentation

## 2012-08-22 DIAGNOSIS — Z803 Family history of malignant neoplasm of breast: Secondary | ICD-10-CM | POA: Insufficient documentation

## 2012-08-22 LAB — URINALYSIS, ROUTINE W REFLEX MICROSCOPIC
Bilirubin Urine: NEGATIVE
Glucose, UA: NEGATIVE mg/dL
Specific Gravity, Urine: 1.02 (ref 1.005–1.030)
Urobilinogen, UA: 1 mg/dL (ref 0.0–1.0)

## 2012-08-22 LAB — URINE MICROSCOPIC-ADD ON

## 2012-08-22 MED ORDER — CEPHALEXIN 500 MG PO CAPS: 500.0000 mg | ORAL_CAPSULE | Freq: Four times a day (QID) | ORAL | Status: DC

## 2012-08-22 MED ORDER — TRAMADOL HCL 50 MG PO TABS: 50.0000 mg | ORAL_TABLET | Freq: Four times a day (QID) | ORAL | Status: DC | PRN

## 2012-08-22 NOTE — ED Notes (Signed)
Pt presents to department for evaluation of R sided back pain. Ongoing x1 week. States she has been moving furniture recently and could of pulled muscle. 10/10 pain that becomes worse with movement. She is alert and oriented x4. No signs of distress noted at the time.

## 2012-08-22 NOTE — ED Provider Notes (Cosign Needed)
History  Scribed for Amy Lennert, MD, the patient was seen in room TR08C/TR08C. This chart was scribed by Candelaria Stagers. The patient's care started at 1:53 PM   CSN: [id number]  Arrival date & time 08/22/12  1220   First MD Initiated Contact with Patient 08/22/12 1350      Chief Complaint  Patient presents with  . Back Pain    Patient is a 26 y.o. female presenting with back pain. The history is provided by the patient. No language interpreter was used.  Back Pain  This is a recurrent problem. The current episode started more than 1 week ago. The problem occurs constantly. The problem has been gradually worsening. The pain is associated with no known injury. The pain is present in the lumbar spine. The pain does not radiate. Exacerbated by: movement. Pertinent negatives include no chest pain, no headaches and no abdominal pain.   Amy Mcfarland is a 26 y.o. female who presents to the Emergency Department complaining of intermittent back pain that has gotten worse over the last two days.  Pt was seen by a sports medicine MD yesterday and was given a muscle relaxer.  The pain is worse with movement.   Past Medical History  Diagnosis Date  . Migraine   . Kidney infection   . Miscarriage     Past Surgical History  Procedure Date  . Cesarean section   . Cesarean section     Family History  Problem Relation Age of Onset  . Asthma Maternal Uncle   . Cancer Maternal Grandmother     breast cancer  . Cancer Paternal Grandmother     History  Substance Use Topics  . Smoking status: Smoker, Current Status Unknown -- 0.2 packs/day for 7 years    Types: Cigarettes  . Smokeless tobacco: Never Used  . Alcohol Use: Yes    OB History    Grav Para Term Preterm Abortions TAB SAB Ect Mult Living   2    1  1   1       Review of Systems  Constitutional: Negative for fatigue.  HENT: Negative for congestion, sinus pressure and ear discharge.   Eyes: Negative for discharge.    Respiratory: Negative for cough.   Cardiovascular: Negative for chest pain.  Gastrointestinal: Negative for abdominal pain and diarrhea.  Genitourinary: Negative for frequency and hematuria.  Musculoskeletal: Positive for back pain (right flank pain).  Skin: Negative for rash.  Neurological: Negative for seizures and headaches.  Hematological: Negative.   Psychiatric/Behavioral: Negative for hallucinations.    Allergies  Peach; Strawberry; and Vicodin  Home Medications   Current Outpatient Rx  Name Route Sig Dispense Refill  . IBUPROFEN 800 MG PO TABS Oral Take 800 mg by mouth every 8 (eight) hours as needed. pain    . MEDROXYPROGESTERONE ACETATE 150 MG/ML IM SUSP Intramuscular Inject 150 mg into the muscle every 3 (three) months. Next dose due July, 2013    . SUMATRIPTAN SUCCINATE 100 MG PO TABS Oral Take 100 mg by mouth every 2 (two) hours as needed. For migraine      BP 108/70  Pulse 93  Temp 98.6 F (37 C) (Oral)  Resp 20  SpO2 95%  Physical Exam  Nursing note and vitals reviewed. Constitutional: She is oriented to person, place, and time. She appears well-developed.  HENT:  Head: Normocephalic and atraumatic.  Eyes: Conjunctivae normal and EOM are normal. No scleral icterus.  Neck: Neck supple. No thyromegaly present.  Cardiovascular: Normal rate and regular rhythm.  Exam reveals no gallop and no friction rub.   No murmur heard. Pulmonary/Chest: No stridor. She has no wheezes. She has no rales. She exhibits no tenderness.  Abdominal: She exhibits no distension. There is no tenderness. There is no rebound.  Musculoskeletal: Normal range of motion. She exhibits no edema.       Tenderness to right flank  Lymphadenopathy:    She has no cervical adenopathy.  Neurological: She is oriented to person, place, and time. Cranial nerve deficit:  no gross defecits noted. Coordination normal.  Skin: No rash noted. No erythema.  Psychiatric: She has a normal mood and affect.  Her behavior is normal.    ED Course  Procedures  DIAGNOSTIC STUDIES: Oxygen Saturation is 95% on room air, normal by my interpretation.    COORDINATION OF CARE:   13:57 Ordered: Urinalysis, Routine w reflex microscopic 3:26 PM Recheck: Discussed lab results and course of care with pt.   Labs Reviewed  URINALYSIS, ROUTINE W REFLEX MICROSCOPIC - Abnormal; Notable for the following:    APPearance HAZY (*)     Hgb urine dipstick LARGE (*)     Leukocytes, UA SMALL (*)     All other components within normal limits  URINE MICROSCOPIC-ADD ON - Abnormal; Notable for the following:    Squamous Epithelial / LPF FEW (*)     Bacteria, UA MANY (*)     All other components within normal limits  URINE CULTURE   No results found.   No diagnosis found.    MDM     The chart was scribed for me under my direct supervision.  I personally performed the history, physical, and medical decision making and all procedures in the evaluation of this patient.Amy Lennert, MD 08/22/12 [phone]

## 2012-08-23 LAB — URINE CULTURE

## 2012-10-09 ENCOUNTER — Encounter (HOSPITAL_COMMUNITY): Payer: Self-pay | Admitting: *Deleted

## 2012-10-09 ENCOUNTER — Emergency Department (HOSPITAL_COMMUNITY)
Admission: EM | Admit: 2012-10-09 | Discharge: 2012-10-09 | Disposition: A | Discharge status: Home / Self Care | Payer: Medicaid Other | Attending: Emergency Medicine | Admitting: Emergency Medicine

## 2012-10-09 DIAGNOSIS — K029 Dental caries, unspecified: Secondary | ICD-10-CM

## 2012-10-09 DIAGNOSIS — F17200 Nicotine dependence, unspecified, uncomplicated: Secondary | ICD-10-CM | POA: Insufficient documentation

## 2012-10-09 MED ORDER — PENICILLIN V POTASSIUM 500 MG PO TABS: 500.0000 mg | ORAL_TABLET | Freq: Three times a day (TID) | ORAL | Status: DC

## 2012-10-09 MED ORDER — OXYCODONE-ACETAMINOPHEN 5-325 MG PO TABS: ORAL_TABLET | ORAL | Status: DC

## 2012-10-09 NOTE — ED Provider Notes (Signed)
History    This chart was scribed for Amy Mcfarland. Oletta Lamas, MD, MD by Smitty Pluck, ED Scribe. The patient was seen in room TR09C and the patient's care was started at 1:07PM.   CSN: [id number]  Arrival date & time 10/09/12  1240      Chief Complaint  Patient presents with  . Dental Pain    (Consider location/radiation/quality/duration/timing/severity/associated sxs/prior treatment) The history is provided by the patient. No language interpreter was used.   Amy Mcfarland is a 26 y.o. female who presents to the Emergency Department complaining of intermittent, moderate right lower dental pain that has been ongoing for years. Reports air and hot/cold foods aggravates pain. She reports that she has taken ibuprofen without relief. Pt reports that smoking cigarettes. Denies fever, chills, nausea, vomiting and any other pain. Pt reports that she has dentist appointment in December with an oral surgeon.  She was fearful to do it however because she was told by a previous provider she would need about 8-9 teeth removed.    Past Medical History  Diagnosis Date  . Migraine   . Kidney infection   . Miscarriage     Past Surgical History  Procedure Date  . Cesarean section   . Cesarean section   . Cesarean section     Family History  Problem Relation Age of Onset  . Asthma Maternal Uncle   . Cancer Maternal Grandmother     breast cancer  . Cancer Paternal Grandmother     History  Substance Use Topics  . Smoking status: Current Some Day Smoker -- 0.2 packs/day for 7 years    Types: Cigarettes  . Smokeless tobacco: Never Used  . Alcohol Use: Yes    OB History    Grav Para Term Preterm Abortions TAB SAB Ect Mult Living   2    1  1   1       Review of Systems  Constitutional: Negative for fever and chills.  HENT: Positive for dental problem. Negative for sore throat, trouble swallowing and voice change.   Respiratory: Negative for shortness of breath.   Gastrointestinal:  Negative for nausea and vomiting.    Allergies  Peach; Strawberry; and Vicodin  Home Medications   Current Outpatient Rx  Name  Route  Sig  Dispense  Refill  . ACETAMINOPHEN 500 MG PO TABS   Oral   Take 1,000 mg by mouth every 6 (six) hours as needed. For pain         . IBUPROFEN 200 MG PO TABS   Oral   Take 800 mg by mouth every 6 (six) hours as needed. For pain         . SUMATRIPTAN SUCCINATE 100 MG PO TABS   Oral   Take 100 mg by mouth every 2 (two) hours as needed. For migraine         . OXYCODONE-ACETAMINOPHEN 5-325 MG PO TABS      1-2 tablets po q 6 hours prn moderate to severe pain   20 tablet   0   . PENICILLIN V POTASSIUM 500 MG PO TABS   Oral   Take 1 tablet (500 mg total) by mouth 3 (three) times daily.   30 tablet   0     BP 125/68  Pulse 82  Temp 98.4 F (36.9 C) (Oral)  Resp 18  SpO2 99%  Physical Exam  Nursing note and vitals reviewed. Constitutional: She is oriented to person, place, and time. She appears  well-developed and well-nourished. No distress.  HENT:  Head: Normocephalic and atraumatic.  Right Ear: External ear normal.  Mouth/Throat: Dental caries present.         Right 1st molar decay  Right 2nd molar has small crack Right 3rd molar has decay No abscess No trismus No fluctuance   Eyes: EOM are normal. No scleral icterus.  Neck: Neck supple. No tracheal deviation present.  Cardiovascular: Normal rate.   Pulmonary/Chest: Effort normal. No respiratory distress.  Musculoskeletal: Normal range of motion.  Neurological: She is alert and oriented to person, place, and time.  Skin: Skin is warm and dry.  Psychiatric: She has a normal mood and affect. Her behavior is normal.    ED Course  Procedures (including critical care time) DIAGNOSTIC STUDIES: Oxygen Saturation is 99% on room air, normal by my interpretation.    COORDINATION OF CARE: 1:12 PM Discussed ED treatment with pt     Labs Reviewed - No data to  display No results found.   1. Pain due to dental caries       MDM  I personally performed the services described in this documentation, which was scribed in my presence. The recorded information has been reviewed and is accurate.   Pt with caries, no obvious abscess, will need to follow up  With Dr. Chales Salmon as previously arranged.  Analgesics and abx given here.    Amy Mcfarland. Oletta Lamas, MD 10/10/12 [phone]

## 2012-10-09 NOTE — ED Notes (Signed)
Pt here with right lower tooth pain

## 2012-10-09 NOTE — Discharge Instructions (Signed)
Narcotic and benzodiazepine use may cause drowsiness, slowed breathing or dependence.  Please use with caution and do not drive, operate machinery or watch young children alone while taking them.  Taking combinations of these medications or drinking alcohol will potentiate these effects.    

## 2012-10-28 ENCOUNTER — Encounter (HOSPITAL_COMMUNITY): Payer: Self-pay | Admitting: Emergency Medicine

## 2012-10-28 ENCOUNTER — Emergency Department (HOSPITAL_COMMUNITY)
Admission: EM | Admit: 2012-10-28 | Discharge: 2012-10-28 | Disposition: A | Discharge status: Home / Self Care | Payer: Medicaid Other | Attending: Emergency Medicine | Admitting: Emergency Medicine

## 2012-10-28 DIAGNOSIS — Z8679 Personal history of other diseases of the circulatory system: Secondary | ICD-10-CM | POA: Insufficient documentation

## 2012-10-28 DIAGNOSIS — Z8742 Personal history of other diseases of the female genital tract: Secondary | ICD-10-CM | POA: Insufficient documentation

## 2012-10-28 DIAGNOSIS — R05 Cough: Secondary | ICD-10-CM | POA: Insufficient documentation

## 2012-10-28 DIAGNOSIS — R6883 Chills (without fever): Secondary | ICD-10-CM | POA: Insufficient documentation

## 2012-10-28 DIAGNOSIS — R063 Periodic breathing: Secondary | ICD-10-CM | POA: Insufficient documentation

## 2012-10-28 DIAGNOSIS — Z87448 Personal history of other diseases of urinary system: Secondary | ICD-10-CM | POA: Insufficient documentation

## 2012-10-28 DIAGNOSIS — R059 Cough, unspecified: Secondary | ICD-10-CM | POA: Insufficient documentation

## 2012-10-28 DIAGNOSIS — J069 Acute upper respiratory infection, unspecified: Secondary | ICD-10-CM | POA: Insufficient documentation

## 2012-10-28 DIAGNOSIS — F17200 Nicotine dependence, unspecified, uncomplicated: Secondary | ICD-10-CM | POA: Insufficient documentation

## 2012-10-28 DIAGNOSIS — Z79899 Other long term (current) drug therapy: Secondary | ICD-10-CM | POA: Insufficient documentation

## 2012-10-28 LAB — RAPID STREP SCREEN (MED CTR MEBANE ONLY): Streptococcus, Group A Screen (Direct): NEGATIVE

## 2012-10-28 MED ORDER — SUMATRIPTAN SUCCINATE 25 MG PO TABS: 50.0000 mg | ORAL_TABLET | ORAL | Status: DC | PRN

## 2012-10-28 MED ORDER — NAPROXEN 500 MG PO TABS: 500.0000 mg | ORAL_TABLET | Freq: Two times a day (BID) | ORAL | Status: DC

## 2012-10-28 MED ORDER — BENZONATATE 100 MG PO CAPS: 100.0000 mg | ORAL_CAPSULE | Freq: Three times a day (TID) | ORAL | Status: DC

## 2012-10-28 MED ORDER — KETOROLAC TROMETHAMINE 60 MG/2ML IM SOLN: 60.0000 mg | Freq: Once | INTRAMUSCULAR | Status: DC

## 2012-10-28 NOTE — ED Notes (Signed)
C/o sore throat, chills, sob, and generalized body aches since Thursday.

## 2012-10-28 NOTE — ED Notes (Signed)
Rx x 3.  Pt voiced understanding to f/u with PCP and return for worsening condition.  

## 2012-10-28 NOTE — ED Provider Notes (Signed)
History     CSN: [id number]  Arrival date & time 10/28/12  0209   First MD Initiated Contact with Patient 10/28/12 [phone]      Chief Complaint  Patient presents with  . Sore Throat  . Chills    (Consider location/radiation/quality/duration/timing/severity/associated sxs/prior treatment) HPI Comments: 57 her old female who presents with a complaint of a sore throat, coughing, chills today. She states that her symptoms started gradual in onset yesterday, they have been relatively persistent, gradually worsening and not associated with phlegm production, nausea, vomiting, fevers, dysuria or diarrhea. Her cough is dry and nonproductive, her throat is itchy and sore and she has left ear pain. 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  Patient is a 26 y.o. female presenting with pharyngitis. The history is provided by the patient.  Sore Throat    Past Medical History  Diagnosis Date  . Migraine   . Kidney infection   . Miscarriage     Past Surgical History  Procedure Date  . Cesarean section   . Cesarean section   . Cesarean section     Family History  Problem Relation Age of Onset  . Asthma Maternal Uncle   . Cancer Maternal Grandmother     breast cancer  . Cancer Paternal Grandmother     History  Substance Use Topics  . Smoking status: Current Some Day Smoker -- 0.2 packs/day for 7 years    Types: Cigarettes  . Smokeless tobacco: Never Used  . Alcohol Use: Yes    OB History    Grav Para Term Preterm Abortions TAB SAB Ect Mult Living   2    1  1   1       Review of Systems  All other systems reviewed and are negative.    Allergies  Peach; Strawberry; and Vicodin  Home Medications   Current Outpatient Rx  Name  Route  Sig  Dispense  Refill  . IBUPROFEN 200 MG PO TABS   Oral   Take 800 mg by mouth every 6 (six) hours as needed. For pain         . NASAL DECONGESTANT SPRAY NA   Nasal    Place 1 spray into the nose 2 (two) times daily as needed. Nasal congestion         . SUMATRIPTAN SUCCINATE 100 MG PO TABS   Oral   Take 100 mg by mouth every 2 (two) hours as needed. For migraine         . BENZONATATE 100 MG PO CAPS   Oral   Take 1 capsule (100 mg total) by mouth every 8 (eight) hours.   21 capsule   0   . NAPROXEN 500 MG PO TABS   Oral   Take 1 tablet (500 mg total) by mouth 2 (two) times daily with a meal.   30 tablet   0   . OXYCODONE-ACETAMINOPHEN 5-325 MG PO TABS      1-2 tablets po q 6 hours prn moderate to severe pain   20 tablet   0   . PENICILLIN V POTASSIUM 500 MG PO TABS   Oral   Take 1 tablet (500 mg total) by mouth 3 (three) times daily.   30 tablet   0   . SUMATRIPTAN SUCCINATE 25 MG PO TABS   Oral   Take 2 tablets (50 mg total) by mouth every 2 (  two) hours as needed for migraine (ongoing headache). Maximum daily dose 200mg    30 tablet   0     BP 142/80  Pulse 98  Temp 98.6 F (37 C) (Oral)  Resp 20  SpO2 98%  Physical Exam  Nursing note and vitals reviewed. Constitutional: She appears well-developed and well-nourished. No distress.  HENT:  Head: Normocephalic and atraumatic.  Mouth/Throat: Oropharynx is clear and moist. No oropharyngeal exudate.       Oropharynx has mild erythema, no exudate asymmetry hypertrophy.  Tympanic membranes clear on the right, the left tympanic membrane is mildly erythematous, no exudate, no bulging, no loss of landmarks. Nasal passages are erythematous with swollen turbinates.  Eyes: Conjunctivae normal and EOM are normal. Pupils are equal, round, and reactive to light. Right eye exhibits no discharge. Left eye exhibits no discharge. No scleral icterus.  Neck: Normal range of motion. Neck supple. No JVD present. No thyromegaly present.  Cardiovascular: Normal rate, regular rhythm, normal heart sounds and intact distal pulses.  Exam reveals no gallop and no friction rub.   No murmur  heard. Pulmonary/Chest: Effort normal and breath sounds normal. No respiratory distress. She has no wheezes. She has no rales.  Abdominal: Soft. Bowel sounds are normal. She exhibits no distension and no mass. There is no tenderness.  Musculoskeletal: Normal range of motion. She exhibits no edema and no tenderness.  Lymphadenopathy:    She has no cervical adenopathy.  Neurological: She is alert. Coordination normal.  Skin: Skin is warm and dry. No rash noted. No erythema.  Psychiatric: She has a normal mood and affect. Her behavior is normal.    ED Course  Procedures (including critical care time)   Labs Reviewed  RAPID STREP SCREEN   No results found.   1. Upper respiratory infection       MDM  Overall the patient has clear lungs, normal vital signs and appears stable for discharge with an upper respiratory infection. She will be given Toradol prior to discharge, she has requested a refill on her Imitrex, sent home with Naprosyn and Tessalon.        Vida Roller, MD 10/28/12 [phone]

## 2012-11-27 ENCOUNTER — Emergency Department (INDEPENDENT_AMBULATORY_CARE_PROVIDER_SITE_OTHER)
Admission: EM | Admit: 2012-11-27 | Discharge: 2012-11-27 | Disposition: A | Discharge status: Home / Self Care | Payer: Self-pay | Source: Home / Self Care | Attending: Emergency Medicine | Admitting: Emergency Medicine

## 2012-11-27 ENCOUNTER — Encounter (HOSPITAL_COMMUNITY): Payer: Self-pay | Admitting: Emergency Medicine

## 2012-11-27 DIAGNOSIS — M791 Myalgia, unspecified site: Secondary | ICD-10-CM

## 2012-11-27 DIAGNOSIS — IMO0001 Reserved for inherently not codable concepts without codable children: Secondary | ICD-10-CM

## 2012-11-27 MED ORDER — TRAMADOL HCL 50 MG PO TABS: 50.0000 mg | ORAL_TABLET | Freq: Four times a day (QID) | ORAL | Status: DC | PRN
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
## 2012-11-27 NOTE — ED Provider Notes (Signed)
Medical screening examination/treatment/procedure(s) were performed by non-physician practitioner and as supervising physician I was immediately available for consultation/collaboration.  Leslee Home, M.D.   Reuben Likes, MD 11/27/12 [phone]

## 2012-11-27 NOTE — ED Notes (Signed)
Upon discharging pt, mother was not satisfied w/todays visit.. Mother wanted to know why we did not do an x-ray... Pt wanted mother to sign her out and I asked her that she needed to sign... They got upset and stated they will never be coming here.
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
## 2012-11-28 ENCOUNTER — Emergency Department (HOSPITAL_COMMUNITY)
Admission: EM | Admit: 2012-11-28 | Discharge: 2012-11-28 | Disposition: A | Discharge status: Home / Self Care | Payer: No Typology Code available for payment source | Attending: Emergency Medicine | Admitting: Emergency Medicine

## 2012-11-28 ENCOUNTER — Encounter (HOSPITAL_COMMUNITY): Payer: Self-pay | Admitting: *Deleted

## 2012-11-28 ENCOUNTER — Emergency Department (HOSPITAL_COMMUNITY): Payer: No Typology Code available for payment source

## 2012-11-28 DIAGNOSIS — Z87448 Personal history of other diseases of urinary system: Secondary | ICD-10-CM | POA: Insufficient documentation

## 2012-11-28 DIAGNOSIS — F17200 Nicotine dependence, unspecified, uncomplicated: Secondary | ICD-10-CM | POA: Insufficient documentation

## 2012-11-28 DIAGNOSIS — Z79899 Other long term (current) drug therapy: Secondary | ICD-10-CM | POA: Insufficient documentation

## 2012-11-28 DIAGNOSIS — Y92410 Unspecified street and highway as the place of occurrence of the external cause: Secondary | ICD-10-CM | POA: Insufficient documentation

## 2012-11-28 DIAGNOSIS — R209 Unspecified disturbances of skin sensation: Secondary | ICD-10-CM | POA: Insufficient documentation

## 2012-11-28 DIAGNOSIS — G43909 Migraine, unspecified, not intractable, without status migrainosus: Secondary | ICD-10-CM | POA: Insufficient documentation

## 2012-11-28 DIAGNOSIS — Y9389 Activity, other specified: Secondary | ICD-10-CM | POA: Insufficient documentation

## 2012-11-28 DIAGNOSIS — S335XXA Sprain of ligaments of lumbar spine, initial encounter: Secondary | ICD-10-CM | POA: Insufficient documentation

## 2012-11-28 DIAGNOSIS — S39012A Strain of muscle, fascia and tendon of lower back, initial encounter: Secondary | ICD-10-CM

## 2012-11-28 MED ORDER — IBUPROFEN 800 MG PO TABS: 800.0000 mg | ORAL_TABLET | Freq: Three times a day (TID) | ORAL | Status: DC | PRN

## 2012-11-28 MED ORDER — CYCLOBENZAPRINE HCL 10 MG PO TABS: 10.0000 mg | ORAL_TABLET | Freq: Three times a day (TID) | ORAL | Status: DC | PRN

## 2012-11-28 MED ORDER — OXYCODONE-ACETAMINOPHEN 5-325 MG PO TABS: 1.0000 | ORAL_TABLET | Freq: Four times a day (QID) | ORAL | Status: DC | PRN
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
## 2012-11-28 NOTE — ED Provider Notes (Signed)
Medical screening examination/treatment/procedure(s) were performed by non-physician practitioner and as supervising physician I was immediately available for consultation/collaboration.    Catherine Oak R Doralene Glanz, MD 11/28/12 1616 

## 2013-03-15 ENCOUNTER — Encounter (HOSPITAL_COMMUNITY): Payer: Self-pay | Admitting: Emergency Medicine

## 2013-03-15 ENCOUNTER — Emergency Department (HOSPITAL_COMMUNITY)
Admission: EM | Admit: 2013-03-15 | Discharge: 2013-03-15 | Disposition: A | Discharge status: Home / Self Care | Payer: Medicaid Other | Attending: Emergency Medicine | Admitting: Emergency Medicine

## 2013-03-15 DIAGNOSIS — Z87448 Personal history of other diseases of urinary system: Secondary | ICD-10-CM | POA: Insufficient documentation

## 2013-03-15 DIAGNOSIS — K029 Dental caries, unspecified: Secondary | ICD-10-CM | POA: Insufficient documentation

## 2013-03-15 DIAGNOSIS — G43909 Migraine, unspecified, not intractable, without status migrainosus: Secondary | ICD-10-CM | POA: Insufficient documentation

## 2013-03-15 DIAGNOSIS — F17200 Nicotine dependence, unspecified, uncomplicated: Secondary | ICD-10-CM | POA: Insufficient documentation

## 2013-03-15 DIAGNOSIS — K089 Disorder of teeth and supporting structures, unspecified: Secondary | ICD-10-CM | POA: Insufficient documentation

## 2013-03-15 DIAGNOSIS — K047 Periapical abscess without sinus: Secondary | ICD-10-CM

## 2013-03-15 DIAGNOSIS — K0889 Other specified disorders of teeth and supporting structures: Secondary | ICD-10-CM

## 2013-03-15 DIAGNOSIS — Z79899 Other long term (current) drug therapy: Secondary | ICD-10-CM | POA: Insufficient documentation

## 2013-03-15 DIAGNOSIS — K044 Acute apical periodontitis of pulpal origin: Secondary | ICD-10-CM | POA: Insufficient documentation

## 2013-03-15 MED ORDER — OXYCODONE-ACETAMINOPHEN 5-325 MG PO TABS: 1.0000 | ORAL_TABLET | Freq: Four times a day (QID) | ORAL | Status: DC | PRN

## 2013-03-15 MED ORDER — AMOXICILLIN 500 MG PO CAPS: 500.0000 mg | ORAL_CAPSULE | Freq: Three times a day (TID) | ORAL | Status: DC

## 2013-03-15 NOTE — ED Provider Notes (Signed)
Medical screening examination/treatment/procedure(s) were performed by non-physician practitioner and as supervising physician I was immediately available for consultation/collaboration.  Flint Melter, MD 03/15/13 [phone]

## 2013-03-15 NOTE — ED Notes (Addendum)
Pt here with c/o tooth ache onset x1 week. Pt denies injury. Pt states she was seen by a dentist about 5 months ago and dentist recommended removal of rest of broken tooth right but her medicaid has been cut. Broken tooth noted right lower.

## 2013-03-15 NOTE — ED Provider Notes (Signed)
History     CSN: [id number]  Arrival date & time 03/15/13  0921   First MD Initiated Contact with Patient 03/15/13 [phone]      Chief Complaint  Patient presents with  . Dental Pain    (Consider location/radiation/quality/duration/timing/severity/associated sxs/prior treatment) HPI Comments: 27 year old female presents emergency department complaining of dental pain times one week. Pain located on her right lower posterior molar. States she was seen by a dentist about 5 months ago and has been advised to have this tooth pulled due to tooth decay and dental caries. Pain described as sharp and throbbing rated 8/10. She tried taking Tylenol without relief. Pain worse with cold or chewing. Denies fever, chills, difficulty swallowing.  Patient is a 27 y.o. female presenting with tooth pain. The history is provided by the patient.  Dental PainPrimary symptoms do not include fever.  Additional symptoms do not include: trouble swallowing.    Past Medical History  Diagnosis Date  . Migraine   . Miscarriage   . Kidney infection     Past Surgical History  Procedure Laterality Date  . Cesarean section    . Cesarean section    . Cesarean section      Family History  Problem Relation Age of Onset  . Asthma Maternal Uncle   . Cancer Maternal Grandmother     breast cancer  . Cancer Paternal Grandmother     History  Substance Use Topics  . Smoking status: Current Some Day Smoker -- 0.25 packs/day for 7 years    Types: Cigarettes  . Smokeless tobacco: Never Used  . Alcohol Use: Yes     Comment: occasionally    OB History   Grav Para Term Preterm Abortions TAB SAB Ect Mult Living   2    1  1   1       Review of Systems  Constitutional: Negative for fever and chills.  HENT: Positive for dental problem. Negative for trouble swallowing.   All other systems reviewed and are negative.    Allergies  Peach; Strawberry; and Vicodin  Home Medications   Current Outpatient Rx    Name  Route  Sig  Dispense  Refill  . amoxicillin (AMOXIL) 500 MG capsule   Oral   Take 1 capsule (500 mg total) by mouth 3 (three) times daily.   21 capsule   0   . cyclobenzaprine (FLEXERIL) 10 MG tablet   Oral   Take 1 tablet (10 mg total) by mouth 3 (three) times daily as needed for muscle spasms.   15 tablet   0   . ibuprofen (ADVIL,MOTRIN) 200 MG tablet   Oral   Take 800 mg by mouth every 6 (six) hours as needed. For pain         . ibuprofen (ADVIL,MOTRIN) 800 MG tablet   Oral   Take 1 tablet (800 mg total) by mouth every 8 (eight) hours as needed for pain.   21 tablet   0   . MedroxyPROGESTERone Acetate (DEPO-PROVERA IM)   Intramuscular   Inject into the muscle every 3 (three) months.         . naproxen (NAPROSYN) 500 MG tablet   Oral   Take 1 tablet (500 mg total) by mouth 2 (two) times daily with a meal.   30 tablet   0   . oxyCODONE-acetaminophen (PERCOCET) 5-325 MG per tablet   Oral   Take 1-2 tablets by mouth every 6 (six) hours as needed for pain.  8 tablet   0   . oxyCODONE-acetaminophen (PERCOCET/ROXICET) 5-325 MG per tablet   Oral   Take 1 tablet by mouth every 6 (six) hours as needed for pain.   15 tablet   0   . SUMAtriptan (IMITREX) 25 MG tablet   Oral   Take 2 tablets (50 mg total) by mouth every 2 (two) hours as needed for migraine (ongoing headache). Maximum daily dose 200mg    30 tablet   0   . traMADol (ULTRAM) 50 MG tablet   Oral   Take 1 tablet (50 mg total) by mouth every 6 (six) hours as needed for pain.   15 tablet   0     BP 117/68  Pulse 83  Temp(Src) 97.7 F (36.5 C)  Resp 17  SpO2 100%  Physical Exam  Constitutional: She is oriented to person, place, and time. She appears well-developed and well-nourished. No distress.  HENT:  Head: Normocephalic and atraumatic.  Mouth/Throat: Oropharynx is clear and moist.    Eyes: Conjunctivae are normal.  Neck: Normal range of motion. Neck supple.  Cardiovascular:  Normal rate, regular rhythm and normal heart sounds.   Pulmonary/Chest: Effort normal and breath sounds normal.  Musculoskeletal: Normal range of motion. She exhibits no edema.  Lymphadenopathy:       Head (right side): Submandibular adenopathy present.  Neurological: She is alert and oriented to person, place, and time.  Skin: Skin is warm and dry. She is not diaphoretic.  Psychiatric: She has a normal mood and affect. Her behavior is normal.    ED Course  Procedures (including critical care time)  Labs Reviewed - No data to display No results found.   1. Dental infection   2. Pain, dental       MDM   Dental pain associated with dental infection. No evidence of dental abscess. Patient is afebrile, non toxic appearing and swallowing secretions well. I gave patient referral to dentist and stressed the importance of dental follow up for ultimate management of dental pain. I will also give amoxicillin and pain control. Patient voices understanding and is agreeable to plan.        Trevor Mace, PA-C 03/15/13 [phone]

## 2013-03-20 ENCOUNTER — Encounter (HOSPITAL_COMMUNITY): Payer: Self-pay | Admitting: *Deleted

## 2013-03-20 DIAGNOSIS — R112 Nausea with vomiting, unspecified: Secondary | ICD-10-CM | POA: Insufficient documentation

## 2013-03-20 DIAGNOSIS — Z8742 Personal history of other diseases of the female genital tract: Secondary | ICD-10-CM | POA: Insufficient documentation

## 2013-03-20 DIAGNOSIS — Z87448 Personal history of other diseases of urinary system: Secondary | ICD-10-CM | POA: Insufficient documentation

## 2013-03-20 DIAGNOSIS — G43909 Migraine, unspecified, not intractable, without status migrainosus: Secondary | ICD-10-CM | POA: Insufficient documentation

## 2013-03-20 DIAGNOSIS — F17200 Nicotine dependence, unspecified, uncomplicated: Secondary | ICD-10-CM | POA: Insufficient documentation

## 2013-03-20 NOTE — ED Notes (Signed)
The pt has had a headache for 2-3 hours with nv.  She is ot of her initrex that she usually takes

## 2013-03-21 ENCOUNTER — Emergency Department (HOSPITAL_COMMUNITY)
Admission: EM | Admit: 2013-03-21 | Discharge: 2013-03-21 | Disposition: A | Discharge status: Home / Self Care | Payer: Medicaid Other | Attending: Emergency Medicine | Admitting: Emergency Medicine

## 2013-03-21 DIAGNOSIS — G43909 Migraine, unspecified, not intractable, without status migrainosus: Secondary | ICD-10-CM

## 2013-03-21 DIAGNOSIS — R112 Nausea with vomiting, unspecified: Secondary | ICD-10-CM

## 2013-03-21 MED ORDER — IBUPROFEN 800 MG PO TABS
800.0000 mg | ORAL_TABLET | Freq: Once | ORAL | Status: AC
  Administered 2013-03-21: 800 mg via ORAL
  Filled 2013-03-21: qty 1

## 2013-03-21 MED ORDER — SUMATRIPTAN SUCCINATE 6 MG/0.5ML ~~LOC~~ SOLN
6.0000 mg | Freq: Once | SUBCUTANEOUS | Status: AC
  Administered 2013-03-21: 6 mg via SUBCUTANEOUS
  Filled 2013-03-21: qty 0.5

## 2013-03-21 MED ORDER — SUMATRIPTAN SUCCINATE 25 MG PO TABS: 50.0000 mg | ORAL_TABLET | ORAL | Status: DC | PRN

## 2013-03-21 MED ORDER — PROCHLORPERAZINE MALEATE 10 MG PO TABS
10.0000 mg | ORAL_TABLET | Freq: Once | ORAL | Status: AC
  Administered 2013-03-21: 10 mg via ORAL
  Filled 2013-03-21: qty 1

## 2013-03-21 MED ORDER — PROCHLORPERAZINE MALEATE 10 MG PO TABS: 10.0000 mg | ORAL_TABLET | Freq: Two times a day (BID) | ORAL | Status: DC | PRN

## 2013-03-21 NOTE — ED Provider Notes (Signed)
History     CSN: [id number]  Arrival date & time 03/20/13  2303   First MD Initiated Contact with Patient 03/21/13 0123      Chief Complaint  Patient presents with  . Headache    HPI Amy Mcfarland is a 27 y.o. female with a history of migraine headaches who is out of migraine headache medicine. Patient says she was supposed to follow with a neurologist in the past but failed to do so. This headache has been going on for 2-3 hours, it started gradually, it feels like her typical migraines, he is the right frontal aspect of the head and somewhat behind her eye, it is throbbing, associated with photophobia, nausea and vomiting.  Patient has not taken anything for it this evening, she had a headache earlier today which she took ibuprofen for.     Past Medical History  Diagnosis Date  . Migraine   . Miscarriage   . Kidney infection     Past Surgical History  Procedure Laterality Date  . Cesarean section    . Cesarean section    . Cesarean section      Family History  Problem Relation Age of Onset  . Asthma Maternal Uncle   . Cancer Maternal Grandmother     breast cancer  . Cancer Paternal Grandmother     History  Substance Use Topics  . Smoking status: Current Some Day Smoker -- 0.25 packs/day for 7 years    Types: Cigarettes  . Smokeless tobacco: Never Used  . Alcohol Use: Yes     Comment: occasionally    OB History   Grav Para Term Preterm Abortions TAB SAB Ect Mult Living   2    1  1   1       Review of Systems At least 10pt or greater review of systems completed and are negative except where specified in the HPI.  Allergies  Peach; Strawberry; and Vicodin  Home Medications   Current Outpatient Rx  Name  Route  Sig  Dispense  Refill  . cetirizine (ZYRTEC) 10 MG tablet   Oral   Take 10 mg by mouth daily.         . MedroxyPROGESTERone Acetate (DEPO-PROVERA IM)   Intramuscular   Inject into the muscle every 3 (three) months.         . SUMAtriptan  (IMITREX) 25 MG tablet   Oral   Take 2 tablets (50 mg total) by mouth every 2 (two) hours as needed for migraine (ongoing headache). Maximum daily dose 200mg    30 tablet   0     BP 127/90  Pulse 88  Temp(Src) 97.7 F (36.5 C)  Resp 20  Physical Exam  Nursing notes reviewed.  Electronic medical record reviewed. VITAL SIGNS:   Filed Vitals:   03/20/13 2309 03/21/13 0307  BP: 127/90 114/71  Pulse: 88 77  Temp: 97.7 F (36.5 C)   Resp: 20 14  SpO2:  98%   CONSTITUTIONAL: Awake, oriented, appears non-toxic HENT: Atraumatic, normocephalic, oral mucosa pink and moist, airway patent. Nares patent without drainage. External ears normal. EYES: Conjunctiva clear, EOMI, PERRLA NECK: Trachea midline, non-tender, supple CARDIOVASCULAR: Normal heart rate, Normal rhythm, No murmurs, rubs, gallops PULMONARY/CHEST: Clear to auscultation, no rhonchi, wheezes, or rales. Symmetrical breath sounds. Non-tender. ABDOMINAL: Non-distended, soft, non-tender - no rebound or guarding.  BS normal. NEUROLOGIC: Facial sensation equal to light touch bilaterally.  Good muscle bulk in the masseter muscle and good lateral movement of  the jaw.  Facial expressions equal and good strength with smile/frown and puffed cheeks.  Hearing grossly intact to finger rub test.  Uvula, tongue are midline with no deviation. Symmetrical palate elevation.  Trapezius and SCM muscles are 5/5 strength bilaterally.   Strength: 5/5 strength flexors and extensors in the upper and lower extremities.  Grip strength, finger adduction/abduction 5/5. Sensation: Sensation intact distally to light touch Cerebellar: No ataxia with walking EXTREMITIES: No clubbing, cyanosis, or edema SKIN: Warm, Dry, No erythema, No rash  ED Course  Procedures (including critical care time)  Labs Reviewed - No data to display No results found.   1. Migraine headache   2. Nausea and vomiting       MDM  On further history, patient says she gets  headaches that seem to be exacerbated by smoking cigarettes, she notices this when she and her mother are playing bingo in a [employer].  Treated the patient with subcutaneous Imitrex as well as oral Compazine, patient's headache almost completely resolved.  By the patient followup with neurology and give her prescription of both Imitrex and Compazine as well as Benadryl-discussed side effects of Compazine of akathisia and dystonia.  Do not think this patient has a subarachnoid hemorrhage or central nervous system infection, her history and physical exam are not consistent with these entities, she is neurologically intact, she had gradual onset of headache.  Patient's pain resolved with typical migraine medicines.  Followup with neurology in one to 2 days.  Return for worsening symptoms        Jones Skene, MD 03/21/13 [phone]

## 2013-04-12 ENCOUNTER — Encounter (HOSPITAL_COMMUNITY): Payer: Self-pay | Admitting: *Deleted

## 2013-04-12 ENCOUNTER — Emergency Department (INDEPENDENT_AMBULATORY_CARE_PROVIDER_SITE_OTHER)
Admission: EM | Admit: 2013-04-12 | Discharge: 2013-04-12 | Disposition: A | Discharge status: Home / Self Care | Payer: Medicaid Other | Source: Home / Self Care | Attending: Emergency Medicine | Admitting: Emergency Medicine

## 2013-04-12 DIAGNOSIS — K0401 Reversible pulpitis: Secondary | ICD-10-CM

## 2013-04-12 MED ORDER — OXYCODONE-ACETAMINOPHEN 5-325 MG PO TABS: ORAL_TABLET | ORAL | Status: DC

## 2013-04-12 MED ORDER — CLINDAMYCIN HCL 300 MG PO CAPS: 300.0000 mg | ORAL_CAPSULE | Freq: Four times a day (QID) | ORAL | Status: DC

## 2013-04-12 NOTE — ED Provider Notes (Signed)
Chief Complaint:   Chief Complaint  Patient presents with  . Dental Pain    History of Present Illness:   Amy Mcfarland is a 27 year old female who has a one-month history of a painful right, lower, second molar. The gingiva seem somewhat swollen she has difficulty opening her mouth. She also has pain with swallowing but denies any difficulty breathing. She's had no headache, facial pain, facial swelling, swelling of her neck, or difficulty breathing. She went to the emergency room around the first of this month and was given amoxicillin and Percocet. She felt some better for a while, went to see a dentist, and has an appointment on June 30 to have definitive removal of her decayed teeth. In the meantime however, the pain has gotten worse and she's out of all of her medication.  Review of Systems:  Other than noted above, the patient denies any of the following symptoms: Systemic:  No fever, chills,  Or sweats. ENT:  No headache, ear ache, sore throat, nasal congestion, facial pain, or swelling. Lymphatic:  No adenopathy. Lungs:  No coughing, wheezing or shortness of breath.  PMFSH:  Past medical history, family history, social history, meds, and allergies were reviewed. She is allergic to Vicodin. She takes when necessary Imitrex for migraine headaches.  Physical Exam:   Vital signs:  BP 111/69  Pulse 71  Temp(Src) 97.9 F (36.6 C) (Oral)  Resp 18 General:  Alert, oriented, in no distress. ENT:  TMs and canals normal.  Nasal mucosa normal. Mouth exam:  There is widespread dental decay. She can only open her mouth about a centimeter. All 3 of her molars are decayed and tender to palpation. There is no swelling of the gingiva, no swelling of the floor the mouth. No swelling externally. The pharynx is clear the airway is patent. Neck:  No swelling or adenopathy. Lungs:  Breath sounds clear and equal bilaterally.  No wheezes, rales or rhonchi. Heart:  Regular rhythm.  No gallops or  murmers. Skin:  Clear, warm and dry.   Assessment:  The encounter diagnosis was Pulpitis.  She'll need to have multiple teeth removed. In the meantime I gave her clindamycin and a small prescription for Percocet, telling her that she would need to make this last until she can see her dentist.  Plan:   1.  The following meds were prescribed:   Discharge Medication List as of 04/12/2013  2:07 PM    START taking these medications   Details  clindamycin (CLEOCIN) 300 MG capsule Take 1 capsule (300 mg total) by mouth 4 (four) times daily., Starting 04/12/2013, Until Discontinued, Normal    oxyCODONE-acetaminophen (PERCOCET) 5-325 MG per tablet 1 to 2 tablets every 6 hours as needed for pain., Print       2.  The patient was instructed in symptomatic care and handouts were given.  Suggested sleeping with head of bed elevated and hot salt water mouthwash. 3.  The patient was told to return if becoming worse in any way, if no better in 3 or 4 days, and given some red flag symptoms such as difficulty swallowing or breathing that would indicate earlier return, especially difficulty breathing. 4.  The patient was told to follow up with a dentist as soon as possible.    Reuben Likes, MD 04/12/13 [phone]

## 2013-04-12 NOTE — ED Notes (Signed)
Pain  r  Lower   Molar  X      1  Month    Seen in  [facility]   And  Dentist   And  Will have  Extraction          In  About  1  Month

## 2013-06-09 ENCOUNTER — Emergency Department (INDEPENDENT_AMBULATORY_CARE_PROVIDER_SITE_OTHER)
Admission: EM | Admit: 2013-06-09 | Discharge: 2013-06-09 | Disposition: A | Discharge status: Home / Self Care | Payer: Medicaid Other | Source: Home / Self Care

## 2013-06-09 ENCOUNTER — Encounter (HOSPITAL_COMMUNITY): Payer: Self-pay | Admitting: Emergency Medicine

## 2013-06-09 DIAGNOSIS — K029 Dental caries, unspecified: Secondary | ICD-10-CM

## 2013-06-09 MED ORDER — CLINDAMYCIN HCL 300 MG PO CAPS: 300.0000 mg | ORAL_CAPSULE | Freq: Four times a day (QID) | ORAL | Status: DC

## 2013-06-09 MED ORDER — OXYCODONE-ACETAMINOPHEN 5-325 MG PO TABS: ORAL_TABLET | ORAL | Status: DC

## 2013-06-09 NOTE — ED Provider Notes (Signed)
Amy Mcfarland is a 27 y.o. female who presents to Urgent Care today for dental pain. Patient has bilateral lower jaw dental pain. She's a past medical history significant for multiple dental caries. She was seen last in May for the same issue and was prescribed clindamycin and a small amount of oxycodone. She just ran out of clindamycin and notes return of pain. She is an appointment on Wednesday for dental extraction. She denies any fevers or chills trouble swallowing or breathing. She feels well otherwise.   PMH reviewed. Dental caries History  Substance Use Topics  . Smoking status: Current Some Day Smoker -- 0.25 packs/day for 7 years    Types: Cigarettes  . Smokeless tobacco: Never Used  . Alcohol Use: Yes     Comment: occasionally   ROS as above Medications reviewed. No current facility-administered medications for this encounter.   Current Outpatient Prescriptions  Medication Sig Dispense Refill  . SUMAtriptan (IMITREX) 25 MG tablet Take 2 tablets (50 mg total) by mouth every 2 (two) hours as needed for migraine (ongoing headache). Maximum daily dose 200mg   30 tablet  0  . cetirizine (ZYRTEC) 10 MG tablet Take 10 mg by mouth daily.      . clindamycin (CLEOCIN) 300 MG capsule Take 1 capsule (300 mg total) by mouth 4 (four) times daily.  40 capsule  0  . MedroxyPROGESTERone Acetate (DEPO-PROVERA IM) Inject into the muscle every 3 (three) months.      Marland Kitchen oxyCODONE-acetaminophen (PERCOCET) 5-325 MG per tablet 1 to 2 tablets every 6 hours as needed for pain.  15 tablet  0  . prochlorperazine (COMPAZINE) 10 MG tablet Take 1 tablet (10 mg total) by mouth 2 (two) times daily as needed (severe headache or nausea). Take with 25mg  benadryl (diphenhydramine)  10 tablet  0    Exam:  BP 125/69  Pulse 77  Temp(Src) 98.2 F (36.8 C) (Oral)  Resp 22  SpO2 98% Gen: Well NAD HEENT: EOMI,  MMM, multiple chronic appearing dental caries of the mandible bilaterally some exposed roots. Surrounding  erythema.  Lungs: CTABL Nl WOB Heart: RRR no MRG Abd: NABS, NT, ND Exts: Non edematous BL  LE, warm and well perfused.   No results found for this or any previous visit (from the past 24 hour(s)). No results found.  Assessment and Plan: 27 y.o. female with infected dental caries chronic appearing. Patient has an appointment Wednesday for extraction. Plan to refill Percocets which he has taken successfully in the past as well as clindamycin. Followup with dentist ASAP.  Discussed warning signs or symptoms. Please see discharge instructions. Patient expresses understanding.      Rodolph Bong, MD 06/09/13 [phone]

## 2013-06-09 NOTE — ED Notes (Signed)
Pt c/o dental pain onset 1 week... Seen previously in May for similar sxs... sxs include: pain... Denies: swelling, fever... Has appt w/dentis 7/30... Alert w/no signs of acute distress.

## 2013-06-27 ENCOUNTER — Encounter: Payer: Self-pay | Admitting: Obstetrics & Gynecology

## 2013-06-27 ENCOUNTER — Ambulatory Visit (INDEPENDENT_AMBULATORY_CARE_PROVIDER_SITE_OTHER): Payer: Medicaid Other | Admitting: Obstetrics & Gynecology

## 2013-06-27 VITALS — BP 127/76 | HR 79 | Temp 98.7°F | Resp 20 | Ht 66.0 in | Wt 270.3 lb

## 2013-06-27 DIAGNOSIS — Z3049 Encounter for surveillance of other contraceptives: Secondary | ICD-10-CM

## 2013-06-27 DIAGNOSIS — Z3042 Encounter for surveillance of injectable contraceptive: Secondary | ICD-10-CM

## 2013-06-27 LAB — POCT PREGNANCY, URINE: Preg Test, Ur: NEGATIVE

## 2013-06-27 MED ORDER — MEDROXYPROGESTERONE ACETATE 150 MG/ML IM SUSP
150.0000 mg | INTRAMUSCULAR | Status: AC
  Administered 2013-06-27: 150 mg via INTRAMUSCULAR

## 2013-06-27 NOTE — Progress Notes (Signed)
Pt here today for annual exam and to restart Depo provera for birth control

## 2013-06-27 NOTE — Patient Instructions (Signed)

## 2013-06-28 DIAGNOSIS — Z3042 Encounter for surveillance of injectable contraceptive: Secondary | ICD-10-CM | POA: Insufficient documentation

## 2013-07-21 ENCOUNTER — Encounter (HOSPITAL_COMMUNITY): Payer: Self-pay | Admitting: *Deleted

## 2013-07-21 ENCOUNTER — Emergency Department (HOSPITAL_COMMUNITY)
Admission: EM | Admit: 2013-07-21 | Discharge: 2013-07-21 | Disposition: A | Discharge status: Home / Self Care | Payer: Medicaid Other | Source: Home / Self Care | Attending: Family Medicine | Admitting: Family Medicine

## 2013-07-21 DIAGNOSIS — K047 Periapical abscess without sinus: Secondary | ICD-10-CM

## 2013-07-21 MED ORDER — CLINDAMYCIN HCL 300 MG PO CAPS: 300.0000 mg | ORAL_CAPSULE | Freq: Three times a day (TID) | ORAL | Status: DC

## 2013-07-21 MED ORDER — DICLOFENAC POTASSIUM 50 MG PO TABS: 50.0000 mg | ORAL_TABLET | Freq: Three times a day (TID) | ORAL | Status: DC

## 2013-07-21 NOTE — ED Notes (Signed)
Pt  Reports  r  Toothache          With  The  Pain  Radiating  To  r  Ear        X  3  Days    She  Is  Sitting  Upright on the  Exam table  Speaking in  Complete  sentances

## 2013-07-21 NOTE — ED Provider Notes (Signed)
CSN: [id number]     Arrival date & time 07/21/13  1126 History   First MD Initiated Contact with Patient 07/21/13 1151     Chief Complaint  Patient presents with  . Dental Pain   (Consider location/radiation/quality/duration/timing/severity/associated sxs/prior Treatment) Patient is a 27 y.o. female presenting with tooth pain. The history is provided by the patient.  Dental Pain Location:  Lower Lower teeth location:  18/LL 2nd molar Quality:  Throbbing Severity:  Moderate Onset quality:  Gradual Duration:  3 days Progression:  Worsening Chronicity:  Recurrent Context: dental caries   Context comment:  Seen 7/26 for bad tooth on right side now on left, has dental extraction scheduled for 9/12, . Previous work-up:  Root canal Associated symptoms: facial pain and gum swelling   Associated symptoms: no facial swelling and no fever     Past Medical History  Diagnosis Date  . Migraine   . Miscarriage   . Kidney infection    Past Surgical History  Procedure Laterality Date  . Cesarean section  2011  . Cesarean section    . Cesarean section     Family History  Problem Relation Age of Onset  . Asthma Maternal Uncle   . Cancer Maternal Grandmother     breast cancer  . Cancer Paternal Grandmother   . Diabetes Mother   . Hypertension Father   . Diabetes Father    History  Substance Use Topics  . Smoking status: Current Some Day Smoker -- 0.25 packs/day for 7 years    Types: Cigarettes  . Smokeless tobacco: Never Used  . Alcohol Use: Yes     Comment: occasionally   OB History   Grav Para Term Preterm Abortions TAB SAB Ect Mult Living   2 1 1  1  1   1      Review of Systems  Constitutional: Negative.  Negative for fever.  HENT: Positive for dental problem. Negative for facial swelling.     Allergies  Peach; Strawberry; and Vicodin  Home Medications   Current Outpatient Rx  Name  Route  Sig  Dispense  Refill  . cetirizine (ZYRTEC) 10 MG tablet   Oral   Take  10 mg by mouth daily.         . clindamycin (CLEOCIN) 300 MG capsule   Oral   Take 1 capsule (300 mg total) by mouth 4 (four) times daily.   40 capsule   0   . clindamycin (CLEOCIN) 300 MG capsule   Oral   Take 1 capsule (300 mg total) by mouth 3 (three) times daily.   21 capsule   0   . diclofenac (CATAFLAM) 50 MG tablet   Oral   Take 1 tablet (50 mg total) by mouth 3 (three) times daily. For dental pain   30 tablet   0   . ibuprofen (ADVIL,MOTRIN) 800 MG tablet   Oral   Take 800 mg by mouth every 8 (eight) hours as needed for pain.         Marland Kitchen oxyCODONE-acetaminophen (PERCOCET) 5-325 MG per tablet      1 to 2 tablets every 6 hours as needed for pain.   15 tablet   0   . prochlorperazine (COMPAZINE) 10 MG tablet   Oral   Take 1 tablet (10 mg total) by mouth 2 (two) times daily as needed (severe headache or nausea). Take with 25mg  benadryl (diphenhydramine)   10 tablet   0   . SUMAtriptan (IMITREX) 25  MG tablet   Oral   Take 2 tablets (50 mg total) by mouth every 2 (two) hours as needed for migraine (ongoing headache). Maximum daily dose 200mg    30 tablet   0    BP 119/82  Pulse 87  Temp(Src) 99.1 F (37.3 C) (Oral)  Resp 18  SpO2 100%  LMP 06/27/2013 Physical Exam  Nursing note and vitals reviewed. Constitutional: She appears well-developed and well-nourished.  HENT:  Head: Normocephalic.  Right Ear: External ear normal.  Left Ear: External ear normal.  Mouth/Throat: Uvula is midline and oropharynx is clear and moist. Abnormal dentition. Dental abscesses and dental caries present.      ED Course  Procedures (including critical care time) Labs Review Labs Reviewed - No data to display Imaging Review No results found.  MDM   1. Dental abscess       Linna Hoff, MD 07/21/13 [phone]

## 2013-08-25 ENCOUNTER — Emergency Department (HOSPITAL_COMMUNITY)
Admission: EM | Admit: 2013-08-25 | Discharge: 2013-08-25 | Disposition: A | Discharge status: Home / Self Care | Payer: Medicaid Other | Attending: Emergency Medicine | Admitting: Emergency Medicine

## 2013-08-25 ENCOUNTER — Encounter (HOSPITAL_COMMUNITY): Payer: Self-pay | Admitting: Emergency Medicine

## 2013-08-25 DIAGNOSIS — Z8742 Personal history of other diseases of the female genital tract: Secondary | ICD-10-CM | POA: Insufficient documentation

## 2013-08-25 DIAGNOSIS — K089 Disorder of teeth and supporting structures, unspecified: Secondary | ICD-10-CM | POA: Insufficient documentation

## 2013-08-25 DIAGNOSIS — Z79899 Other long term (current) drug therapy: Secondary | ICD-10-CM | POA: Insufficient documentation

## 2013-08-25 DIAGNOSIS — K0889 Other specified disorders of teeth and supporting structures: Secondary | ICD-10-CM

## 2013-08-25 DIAGNOSIS — Z87440 Personal history of urinary (tract) infections: Secondary | ICD-10-CM | POA: Insufficient documentation

## 2013-08-25 DIAGNOSIS — F17200 Nicotine dependence, unspecified, uncomplicated: Secondary | ICD-10-CM | POA: Insufficient documentation

## 2013-08-25 DIAGNOSIS — Z885 Allergy status to narcotic agent status: Secondary | ICD-10-CM | POA: Insufficient documentation

## 2013-08-25 DIAGNOSIS — K029 Dental caries, unspecified: Secondary | ICD-10-CM | POA: Insufficient documentation

## 2013-08-25 MED ORDER — OXYCODONE-ACETAMINOPHEN 5-325 MG PO TABS: 1.0000 | ORAL_TABLET | ORAL | Status: DC | PRN

## 2013-08-25 MED ORDER — AMOXICILLIN 500 MG PO CAPS: 500.0000 mg | ORAL_CAPSULE | Freq: Three times a day (TID) | ORAL | Status: DC
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
## 2013-08-25 NOTE — ED Provider Notes (Signed)
CSN: [id number]     Arrival date & time 08/25/13  1054 History   This chart was scribed for non-physician practitioner, Arthor Captain, working with No att. providers found by Clydene Laming, ED Scribe. This patient was seen in room TR08C/TR08C and the patient's care was started at 11:16AM.   Chief Complaint  Patient presents with  . Dental Pain    Patient is a 27 y.o. female presenting with tooth pain. The history is provided by the patient and medical records. No language interpreter was used.  Dental Pain Location:  Upper Upper teeth location:  2/RU 2nd molar Quality:  Throbbing, radiating, sharp and constant Severity:  Severe Onset quality:  Gradual Timing:  Constant Progression:  Worsening Chronicity:  Recurrent Context: dental caries   Previous work-up:  Root canal Relieved by:  Nothing Worsened by:  Cold food/drink, hot food/drink, jaw movement, touching and pressure Associated symptoms: gum swelling   Associated symptoms: no congestion, no difficulty swallowing, no drooling, no facial pain, no facial swelling, no fever, no headaches, no neck pain, no neck swelling, no oral bleeding, no oral lesions and no trismus   Risk factors: smoking    HPI Comments:    Past Medical History  Diagnosis Date  . Migraine   . Miscarriage   . Kidney infection    Past Surgical History  Procedure Laterality Date  . Cesarean section  2011  . Cesarean section    . Cesarean section     Family History  Problem Relation Age of Onset  . Asthma Maternal Uncle   . Cancer Maternal Grandmother     breast cancer  . Cancer Paternal Grandmother   . Diabetes Mother   . Hypertension Father   . Diabetes Father    History  Substance Use Topics  . Smoking status: Current Some Day Smoker -- 0.25 packs/day for 7 years    Types: Cigarettes  . Smokeless tobacco: Never Used  . Alcohol Use: Yes     Comment: occasionally   OB History   Grav Para Term Preterm Abortions TAB SAB Ect Mult Living   2  1 1  1  1   1      Review of Systems  Constitutional: Negative for fever.  HENT: Negative for congestion, drooling, facial swelling and mouth sores.   Musculoskeletal: Negative for neck pain.  Neurological: Negative for headaches.    Allergies  Peach; Strawberry; and Vicodin  Home Medications   Current Outpatient Rx  Name  Route  Sig  Dispense  Refill  . cetirizine (ZYRTEC) 10 MG tablet   Oral   Take 10 mg by mouth daily.         . clindamycin (CLEOCIN) 300 MG capsule   Oral   Take 1 capsule (300 mg total) by mouth 4 (four) times daily.   40 capsule   0   . clindamycin (CLEOCIN) 300 MG capsule   Oral   Take 1 capsule (300 mg total) by mouth 3 (three) times daily.   21 capsule   0   . diclofenac (CATAFLAM) 50 MG tablet   Oral   Take 1 tablet (50 mg total) by mouth 3 (three) times daily. For dental pain   30 tablet   0   . ibuprofen (ADVIL,MOTRIN) 800 MG tablet   Oral   Take 800 mg by mouth every 8 (eight) hours as needed for pain.         Marland Kitchen oxyCODONE-acetaminophen (PERCOCET) 5-325 MG per tablet  1 to 2 tablets every 6 hours as needed for pain.   15 tablet   0   . prochlorperazine (COMPAZINE) 10 MG tablet   Oral   Take 1 tablet (10 mg total) by mouth 2 (two) times daily as needed (severe headache or nausea). Take with 25mg  benadryl (diphenhydramine)   10 tablet   0   . SUMAtriptan (IMITREX) 25 MG tablet   Oral   Take 2 tablets (50 mg total) by mouth every 2 (two) hours as needed for migraine (ongoing headache). Maximum daily dose 200mg    30 tablet   0    Triage Vitals:BP 137/87  Pulse 82  Temp(Src) 97.2 F (36.2 C) (Oral)  Resp 18  SpO2 98% Physical Exam  Nursing note and vitals reviewed. Constitutional: She is oriented to person, place, and time. She appears well-developed and well-nourished. No distress.  HENT:  Head: Normocephalic and atraumatic.  Second molar tooth decay down to root with exposed pulp Surrounding gengival  erythema Tender to palpation   Eyes: EOM are normal.  Neck: Neck supple. No tracheal deviation present.  Cardiovascular: Normal rate.   Pulmonary/Chest: Effort normal. No respiratory distress.  Musculoskeletal: Normal range of motion.  Neurological: She is alert and oriented to person, place, and time.  Skin: Skin is warm and dry.  Psychiatric: She has a normal mood and affect. Her behavior is normal.    ED Course  Procedures (including critical care time) DIAGNOSTIC STUDIES: Oxygen Saturation is 98% on RA, normal by my interpretation.    COORDINATION OF CARE: 11:20 AM- Discussed treatment plan with pt at bedside. Pt verbalized understanding and agreement with plan.   Labs Review Labs Reviewed - No data to display Imaging Review No results found.  EKG Interpretation   None       MDM   1. Pain, dental    Patient with toothache.  No gross abscess.  Exam unconcerning for Ludwig's angina or spread of infection.  Will treat with penicillin and pain medicine.  Urged patient to follow-up with dentist.    I personally performed the services described in this documentation, which was scribed in my presence. The recorded information has been reviewed and is accurate.     Arthor Captain, PA-C 08/25/13 1146

## 2013-08-26 NOTE — ED Provider Notes (Signed)
Medical screening examination/treatment/procedure(s) were performed by non-physician practitioner and as supervising physician I was immediately available for consultation/collaboration.   Yasenia Reedy, MD 08/26/13 0933 

## 2013-09-12 ENCOUNTER — Ambulatory Visit: Payer: Medicaid Other

## 2013-09-24 ENCOUNTER — Encounter (HOSPITAL_COMMUNITY): Payer: Self-pay | Admitting: Emergency Medicine

## 2013-09-24 ENCOUNTER — Emergency Department (HOSPITAL_COMMUNITY): Payer: Medicaid Other

## 2013-09-24 ENCOUNTER — Emergency Department (HOSPITAL_COMMUNITY)
Admission: EM | Admit: 2013-09-24 | Discharge: 2013-09-24 | Disposition: A | Discharge status: Home / Self Care | Payer: Medicaid Other | Attending: Emergency Medicine | Admitting: Emergency Medicine

## 2013-09-24 DIAGNOSIS — M79609 Pain in unspecified limb: Secondary | ICD-10-CM | POA: Insufficient documentation

## 2013-09-24 DIAGNOSIS — R0602 Shortness of breath: Secondary | ICD-10-CM | POA: Insufficient documentation

## 2013-09-24 DIAGNOSIS — R0789 Other chest pain: Secondary | ICD-10-CM

## 2013-09-24 DIAGNOSIS — R071 Chest pain on breathing: Secondary | ICD-10-CM | POA: Insufficient documentation

## 2013-09-24 DIAGNOSIS — M546 Pain in thoracic spine: Secondary | ICD-10-CM | POA: Insufficient documentation

## 2013-09-24 DIAGNOSIS — Z79899 Other long term (current) drug therapy: Secondary | ICD-10-CM | POA: Insufficient documentation

## 2013-09-24 DIAGNOSIS — Z8742 Personal history of other diseases of the female genital tract: Secondary | ICD-10-CM | POA: Insufficient documentation

## 2013-09-24 DIAGNOSIS — F17200 Nicotine dependence, unspecified, uncomplicated: Secondary | ICD-10-CM | POA: Insufficient documentation

## 2013-09-24 DIAGNOSIS — Z885 Allergy status to narcotic agent status: Secondary | ICD-10-CM | POA: Insufficient documentation

## 2013-09-24 DIAGNOSIS — R609 Edema, unspecified: Secondary | ICD-10-CM | POA: Insufficient documentation

## 2013-09-24 DIAGNOSIS — Z8669 Personal history of other diseases of the nervous system and sense organs: Secondary | ICD-10-CM | POA: Insufficient documentation

## 2013-09-24 DIAGNOSIS — Z87440 Personal history of urinary (tract) infections: Secondary | ICD-10-CM | POA: Insufficient documentation

## 2013-09-24 LAB — CBC
MCHC: 34.3 g/dL (ref 30.0–36.0)
Platelets: 262 10*3/uL (ref 150–400)
RBC: 4.55 MIL/uL (ref 3.87–5.11)
WBC: 11.7 10*3/uL — ABNORMAL HIGH (ref 4.0–10.5)

## 2013-09-24 LAB — BASIC METABOLIC PANEL
BUN: 8 mg/dL (ref 6–23)
CO2: 21 mEq/L (ref 19–32)
Chloride: 105 mEq/L (ref 96–112)
Sodium: 136 mEq/L (ref 135–145)

## 2013-09-24 LAB — POCT I-STAT TROPONIN I: Troponin i, poc: 0 ng/mL (ref 0.00–0.08)

## 2013-09-24 MED ORDER — CYCLOBENZAPRINE HCL 10 MG PO TABS
5.0000 mg | ORAL_TABLET | Freq: Once | ORAL | Status: AC
  Administered 2013-09-24: 5 mg via ORAL
  Filled 2013-09-24: qty 1

## 2013-09-24 MED ORDER — OXYCODONE-ACETAMINOPHEN 5-325 MG PO TABS
1.0000 | ORAL_TABLET | Freq: Once | ORAL | Status: AC
  Administered 2013-09-24: 1 via ORAL
  Filled 2013-09-24: qty 1

## 2013-09-24 NOTE — ED Notes (Addendum)
"  feels better, ready to go", mentions some calf pain when walking, 3/10, (denies: CP, sob or other sx). Pt may move to POD C per Dr. Noreene Filbert, moved to room C24, report given to Imma, RN, pending lab draw/ result, PBT notified, lab at [facility]

## 2013-09-24 NOTE — ED Provider Notes (Signed)
CSN: [id number]     Arrival date & time 09/24/13  1614 History   First MD Initiated Contact with Patient 09/24/13 1732     Chief Complaint  Patient presents with  . Chest Pain  . Leg Pain  . Shortness of Breath   (Consider location/radiation/quality/duration/timing/severity/associated sxs/prior Treatment) The history is provided by the patient. No language interpreter was used.   Patient is a 27 year-old [place] American female with no past medical history comes emergency department today with chest pain. She describes chest pain is pleuritic in nature and worse with movement of her left upper extremity. It is located in her back on the left side. She denies any injuries to the back. The pain is nonexertional. She denies cough. She does use Depo-Provera for birth control. In addition she also reports some right lower extremity edema and pain that she noticed a couple of days ago. This pain in her chest at an 8/10. The pain in her leg she rates a 6/10. No fevers, no chills, no shortness of breath, no weakness and no numbness.  Past Medical History  Diagnosis Date  . Migraine   . Miscarriage   . Kidney infection    Past Surgical History  Procedure Laterality Date  . Cesarean section  2011  . Cesarean section    . Cesarean section     Family History  Problem Relation Age of Onset  . Asthma Maternal Uncle   . Cancer Maternal Grandmother     breast cancer  . Cancer Paternal Grandmother   . Diabetes Mother   . Hypertension Father   . Diabetes Father    History  Substance Use Topics  . Smoking status: Current Some Day Smoker -- 0.25 packs/day for 7 years    Types: Cigarettes  . Smokeless tobacco: Never Used  . Alcohol Use: Yes     Comment: occasionally   OB History   Grav Para Term Preterm Abortions TAB SAB Ect Mult Living   2 1 1  1  1   1      Review of Systems  Constitutional: Negative for fever and chills.  Respiratory: Negative for cough, chest tightness and shortness  of breath.   Cardiovascular: Positive for leg swelling. Negative for chest pain. Palpitations: right   Gastrointestinal: Negative for nausea, vomiting, diarrhea and constipation.  Genitourinary: Negative for dysuria, urgency and frequency.  Musculoskeletal: Positive for back pain (left upper thoracic).  All other systems reviewed and are negative.    Allergies  Peach; Strawberry; and Vicodin  Home Medications   Current Outpatient Rx  Name  Route  Sig  Dispense  Refill  . acetaminophen (TYLENOL) 500 MG tablet   Oral   Take 1,000 mg by mouth every 6 (six) hours as needed for pain.         Marland Kitchen ibuprofen (ADVIL,MOTRIN) 200 MG tablet   Oral   Take 200-800 mg by mouth every 6 (six) hours as needed for pain.         Marland Kitchen oxyCODONE-acetaminophen (PERCOCET) 5-325 MG per tablet   Oral   Take 1-2 tablets by mouth every 4 (four) hours as needed for pain.   20 tablet   0   . SUMAtriptan (IMITREX) 25 MG tablet   Oral   Take 2 tablets (50 mg total) by mouth every 2 (two) hours as needed for migraine (ongoing headache). Maximum daily dose 200mg    30 tablet   0    BP 116/60  Pulse 65  Temp(Src) 98.9 F (37.2 C) (Oral)  Resp 18  Ht 5\' 6"  (1.676 m)  Wt 273 lb 8 oz (124.059 kg)  BMI 44.17 kg/m2  SpO2 100% Physical Exam  Nursing note and vitals reviewed. Constitutional: She is oriented to person, place, and time. She appears well-developed and well-nourished. No distress.  HENT:  Head: Normocephalic and atraumatic.  Eyes: Pupils are equal, round, and reactive to light.  Neck: Normal range of motion.  Cardiovascular: Normal rate, regular rhythm, normal heart sounds and intact distal pulses.   No lower extremity edema.    Pulmonary/Chest: Effort normal. No respiratory distress. She has no wheezes. She exhibits no tenderness.  Abdominal: Soft. Bowel sounds are normal. She exhibits no distension. There is no tenderness. There is no rebound and no guarding.  Musculoskeletal:        Right ankle: Normal. She exhibits no swelling, no ecchymosis, no deformity and no laceration. No tenderness.       Right lower leg: She exhibits no tenderness, no bony tenderness, no swelling, no edema, no deformity and no laceration.  Left upper thoracic region tender to palpation.    Neurological: She is alert and oriented to person, place, and time. She has normal strength. No cranial nerve deficit or sensory deficit. She exhibits normal muscle tone. Coordination and gait normal.  Skin: Skin is warm and dry.    ED Course  Procedures (including critical care time) Labs Review Labs Reviewed  CBC - Abnormal; Notable for the following:    WBC 11.7 (*)    All other components within normal limits  BASIC METABOLIC PANEL - Abnormal; Notable for the following:    GFR calc non Af Amer 86 (*)    All other components within normal limits  D-DIMER, QUANTITATIVE  POCT I-STAT TROPONIN I   Imaging Review Dg Chest 2 View  09/24/2013   CLINICAL DATA:  Chest pain and leg pain. Shortness of breath.  EXAM: CHEST  2 VIEW  COMPARISON:  01/29/2012  FINDINGS: The heart is upper limits normal in size. Lungs are clear. No pulmonary edema or consolidations. Visualized osseous structures have a normal appearance.  IMPRESSION: No active cardiopulmonary disease.   Electronically Signed   By: Rosalie Gums M.D.   On: 09/24/2013 17:25    EKG Interpretation   None       MDM  The patient is a 27 year-old [place] American female with no past medical history comes emergency department today with chest pain that is worse with movement and breathing. Physical exam as above. Initial workup included a CBC, BMP, troponin, d-dimer, chest x-ray, and EKG. Chest x-ray demonstrated no cardiopulmonary abnormalities. BMP was unremarkable. CBC had a WBC of 11.7 otherwise unremarkable. Troponin was negative. D-dimer was negative. Patient was felt to be low risk for PE. With no visible edema to bilateral lower extremities patient is  not felt to require an ultrasound for DVT. Patient's history is consistent with musculoskeletal chest pain. With no exertional component to the pain doubt MI. Patient's pain was treated with Percocet and Flexeril emergency department which completely relieved the pain. Patient was felt to be stable for discharge. They already have Percocet and Flexeril home. He was felt to be stable for discharge. They're instructed to followup with her primary care physician within the next week. They're instructed to return to emergency Department developed worsening pain, fevers, chills, or shortness of breath. Labs and imaging reviewed by myself and considered and medical decision-making. Imaging was interpreted by radiology.  Care was discussed with my attending Dr. Jodi Mourning.    1. Musculoskeletal chest pain       Bethann Berkshire, MD 09/25/13 [phone]

## 2013-09-24 NOTE — ED Notes (Addendum)
Pt alert, NAD, calm, interactive, ambulatory back from b/r, friend at [facility]. Urine sample saved at [facility].

## 2013-09-24 NOTE — ED Notes (Signed)
Pt c/o left sided CP through to back x 2 days with SOB; pt right calf pain and swelling; pt noted to be labored at present; pt sts pain worse with inspiration

## 2013-09-25 NOTE — ED Provider Notes (Signed)
Medical screening examination/treatment/procedure(s) were conducted as a shared visit with non-physician practitioner(s) or resident  and myself.  I personally evaluated the patient during the encounter and agree with the findings and plan unless otherwise indicated.    I have personally reviewed any xrays and/ or EKG's with the provider and I agree with interpretation.   Low risk chest pain, low risk PE, d-dimer neg- no indication for CT at this time. Tender paraspinal thoracic, reproducable for pt pain.  Clinically MSK.  EKG no acute findings.  Fup outpt.  Back pain, msk  Enid Skeens, MD 09/25/13 [phone]

## 2013-10-03 ENCOUNTER — Encounter (HOSPITAL_COMMUNITY): Payer: Self-pay | Admitting: Emergency Medicine

## 2013-10-03 ENCOUNTER — Emergency Department (HOSPITAL_COMMUNITY)
Admission: EM | Admit: 2013-10-03 | Discharge: 2013-10-03 | Disposition: A | Discharge status: Home / Self Care | Payer: Medicaid Other | Attending: Emergency Medicine | Admitting: Emergency Medicine

## 2013-10-03 DIAGNOSIS — G43909 Migraine, unspecified, not intractable, without status migrainosus: Secondary | ICD-10-CM | POA: Insufficient documentation

## 2013-10-03 DIAGNOSIS — J020 Streptococcal pharyngitis: Secondary | ICD-10-CM | POA: Insufficient documentation

## 2013-10-03 DIAGNOSIS — F17200 Nicotine dependence, unspecified, uncomplicated: Secondary | ICD-10-CM | POA: Insufficient documentation

## 2013-10-03 DIAGNOSIS — Z87448 Personal history of other diseases of urinary system: Secondary | ICD-10-CM | POA: Insufficient documentation

## 2013-10-03 DIAGNOSIS — Z792 Long term (current) use of antibiotics: Secondary | ICD-10-CM | POA: Insufficient documentation

## 2013-10-03 MED ORDER — SUMATRIPTAN SUCCINATE 25 MG PO TABS: 50.0000 mg | ORAL_TABLET | ORAL | Status: DC | PRN

## 2013-10-03 MED ORDER — PENICILLIN G BENZATHINE 1200000 UNIT/2ML IM SUSP
1.2000 10*6.[IU] | Freq: Once | INTRAMUSCULAR | Status: AC
  Administered 2013-10-03: 1.2 10*6.[IU] via INTRAMUSCULAR
  Filled 2013-10-03: qty 2

## 2013-10-03 MED ORDER — OXYCODONE-ACETAMINOPHEN 5-325 MG PO TABS
2.0000 | ORAL_TABLET | Freq: Once | ORAL | Status: AC
  Administered 2013-10-03: 2 via ORAL
  Filled 2013-10-03: qty 2

## 2013-10-03 NOTE — ED Notes (Signed)
Ear pain left and very bad sore throat x 2 days  Hurts to swollow

## 2013-10-03 NOTE — ED Provider Notes (Signed)
CSN: [id number]     Arrival date & time 10/03/13  1312 History  This chart was scribed for Amy Beck, PA, working with Amy Mcfarland Payor, MD, by Amy Mcfarland ED Scribe. This patient was seen in room TR07C/TR07C and the patient's care was started at 1:24 PM.   Chief Complaint  Patient presents with  . Otalgia    Patient is a 27 y.o. female presenting with pharyngitis. The history is provided by the patient. No language interpreter was used.  Sore Throat This is a new problem. The current episode started 2 days ago. The problem occurs rarely. The problem has been gradually worsening. Pertinent negatives include no chest pain, no abdominal pain, no headaches and no shortness of breath. The symptoms are aggravated by swallowing. Nothing relieves the symptoms. Treatments tried: Ibuprofen, Chloraseptic spray. The treatment provided no relief.    HPI Comments: Amy Mcfarland is a 27 y.o. female who presents to the Emergency Department complaining of a gradually worsening, constant sore throat over the past 2 days. She states that her throat feels swollen, "like it is about to close up".  She reports associated constant, moderate left ear pain over the past 2 days. She states that her pain in both her throat and in her ear are worsened with swallowing. She states that she has used chloraseptic throat spray and Ibuprofen without relief. She also states that she has taken a few antibiotic pills from a previous prescription without relief. She states that she has been in contact with her son who is sick with cold symptoms. She states that she has no history of Strep, but that she does have a history of ear infections. She denies fever or any other symptoms. She states that she is not driving today.    Past Medical History  Diagnosis Date  . Migraine   . Miscarriage   . Kidney infection    Past Surgical History  Procedure Laterality Date  . Cesarean section  2011  . Cesarean section    .  Cesarean section     Family History  Problem Relation Age of Onset  . Asthma Maternal Uncle   . Cancer Maternal Grandmother     breast cancer  . Cancer Paternal Grandmother   . Diabetes Mother   . Hypertension Father   . Diabetes Father    History  Substance Use Topics  . Smoking status: Current Some Day Smoker -- 0.25 packs/day for 7 years    Types: Cigarettes  . Smokeless tobacco: Never Used  . Alcohol Use: Yes     Comment: occasionally   OB History   Grav Para Term Preterm Abortions TAB SAB Ect Mult Living   2 1 1  1  1   1      Review of Systems  Respiratory: Negative for shortness of breath.   Cardiovascular: Negative for chest pain.  Gastrointestinal: Negative for abdominal pain.  Neurological: Negative for headaches.  All other systems reviewed and are negative.   Allergies  Peach; Strawberry; and Vicodin  Home Medications   Current Outpatient Rx  Name  Route  Sig  Dispense  Refill  . acetaminophen (TYLENOL) 500 MG tablet   Oral   Take 500 mg by mouth once.         . clindamycin (CLEOCIN) 300 MG capsule   Oral   Take 300 mg by mouth daily.         Marland Kitchen ibuprofen (ADVIL,MOTRIN) 200 MG tablet   Oral  Take 800 mg by mouth once.          . SUMAtriptan (IMITREX) 25 MG tablet   Oral   Take 2 tablets (50 mg total) by mouth every 2 (two) hours as needed for migraine (ongoing headache). Maximum daily dose 200mg    30 tablet   0    Triage Vitals: BP 151/51  Pulse 96  Temp(Src) 97.8 F (36.6 C) (Oral)  Resp 20  SpO2 99%  Physical Exam  Nursing note and vitals reviewed. Constitutional: She is oriented to person, place, and time. She appears well-developed and well-nourished. No distress.  HENT:  Head: Normocephalic and atraumatic.  Mouth/Throat: Posterior oropharyngeal edema and posterior oropharyngeal erythema present.  No tonsillar exudate noted.  Eyes: EOM are normal.  Neck: Neck supple. No tracheal deviation present.  Cardiovascular: Normal  rate.   Pulmonary/Chest: Effort normal. No respiratory distress.  Musculoskeletal: Normal range of motion.  Neurological: She is alert and oriented to person, place, and time.  Skin: Skin is warm and dry.  Psychiatric: She has a normal mood and affect. Her behavior is normal.    ED Course  Procedures (including critical care time)  DIAGNOSTIC STUDIES: Oxygen Saturation is 99% on RA, normal by my interpretation.    COORDINATION OF CARE: 1:29 PM- Discussed plan to obtain a Strep test. Will order antibiotics and pain medication for pt to receive in the ED. Pt advised of plan for treatment and pt agrees.  Medications  penicillin g benzathine (BICILLIN LA) 1200000 UNIT/2ML injection 1.2 Million Units (1.2 Million Units Intramuscular Given 10/03/13 1348)  oxyCODONE-acetaminophen (PERCOCET/ROXICET) 5-325 MG per tablet 2 tablet (2 tablets Oral Given 10/03/13 1347)   Labs Review Labs Reviewed - No data to display Imaging Review No results found.  EKG Interpretation   None       MDM   1. Strep pharyngitis    Patient will be treated for strep throat with Pen G. Patient will have Vicodin here for pain. Vitals stable and patient afebrile.   I personally performed the services described in this documentation, which was scribed in my presence. The recorded information has been reviewed and is accurate.    Amy Beck, PA-C 10/03/13 1515

## 2013-10-03 NOTE — ED Notes (Signed)
Pt c/o sore throat and left ear pain x 2 days. Also points to an area on left of the hard pallet as being a source of pain.

## 2013-10-11 NOTE — ED Provider Notes (Signed)
Medical screening examination/treatment/procedure(s) were performed by non-physician practitioner and as supervising physician I was immediately available for consultation/collaboration.  EKG Interpretation   None        Juliet Rude. Rubin Payor, MD 10/11/13 1056

## 2013-10-15 ENCOUNTER — Ambulatory Visit: Payer: Medicaid Other

## 2013-10-22 ENCOUNTER — Ambulatory Visit: Payer: Medicaid Other

## 2013-11-22 ENCOUNTER — Emergency Department (INDEPENDENT_AMBULATORY_CARE_PROVIDER_SITE_OTHER)
Admission: EM | Admit: 2013-11-22 | Discharge: 2013-11-22 | Disposition: A | Discharge status: Home / Self Care | Payer: Medicaid Other | Source: Home / Self Care | Attending: Emergency Medicine | Admitting: Emergency Medicine

## 2013-11-22 ENCOUNTER — Encounter (HOSPITAL_COMMUNITY): Payer: Self-pay | Admitting: Emergency Medicine

## 2013-11-22 DIAGNOSIS — K0401 Reversible pulpitis: Secondary | ICD-10-CM

## 2013-11-22 MED ORDER — METRONIDAZOLE 500 MG PO TABS: 500.0000 mg | ORAL_TABLET | Freq: Three times a day (TID) | ORAL | Status: DC

## 2013-11-22 MED ORDER — OXYCODONE-ACETAMINOPHEN 5-325 MG PO TABS: ORAL_TABLET | ORAL | Status: DC

## 2013-11-22 MED ORDER — AMOXICILLIN 500 MG PO CAPS: 500.0000 mg | ORAL_CAPSULE | Freq: Three times a day (TID) | ORAL | Status: DC

## 2013-11-22 MED ORDER — BENZONATATE 200 MG PO CAPS: ORAL_CAPSULE | ORAL | Status: DC

## 2013-11-22 NOTE — ED Notes (Signed)
Tooth ache that started on Sunday.  Reports a dental filling fell out last week.  patient now having left ear pain and throat pain.

## 2013-11-22 NOTE — ED Provider Notes (Signed)
Chief Complaint:   Chief Complaint  Patient presents with  . Dental Pain    History of Present Illness:   Amy Mcfarland is a 28 year old female who has had a one-week history of pain in the left, lower, first molar. The filling came out a while back and to his been decayed. It hurts to chew on that side, if she has some soreness in the throat and the ear she denies any fever headache. There is no neck pain or swelling of the neck. She denies any difficulty swallowing or breathing.  Review of Systems:  Other than noted above, the patient denies any of the following symptoms: Systemic:  No fever, chills,  Or sweats. ENT:  No headache, ear ache, sore throat, nasal congestion, facial pain, or swelling. Lymphatic:  No adenopathy. Lungs:  No coughing, wheezing or shortness of breath.  PMFSH:  Past medical history, family history, social history, meds, and allergies were reviewed. She is allergic to Vicodin. She takes Depo-Provera and Imitrex. She has migraine headaches.  Physical Exam:   Vital signs:  BP 118/64  Pulse 79  Temp(Src) 98 F (36.7 C) (Oral)  Resp 16  SpO2 100% General:  Alert, oriented, in no distress. ENT:  TMs and canals normal.  Nasal mucosa normal. Mouth exam:  She is widespread dental decay. The tooth in question is the left, lower, first molar. This is decayed almost down to the gumline. It's tender to touch. There is no swelling the gingiva, no collection of pus, no swelling of the cheek or the neck. No swelling of the floor the mouth of the tongue. The pharynx is clear and widely patent. Neck:  No swelling or adenopathy. Lungs:  Breath sounds clear and equal bilaterally.  No wheezes, rales or rhonchi. Heart:  Regular rhythm.  No gallops or murmers. Skin:  Clear, warm and dry.   Course in Urgent Care Center:   Benzocaine dental gel was applied for symptomatic relief.  Assessment:  The encounter diagnosis was Pulpitis.  Plan:   1.  Meds:  The following meds were  prescribed:   New Prescriptions   AMOXICILLIN (AMOXIL) 500 MG CAPSULE    Take 1 capsule (500 mg total) by mouth 3 (three) times daily.   BENZONATATE (TESSALON) 200 MG CAPSULE    Puncture capsule and apply to affected tooth every 8 hours.   METRONIDAZOLE (FLAGYL) 500 MG TABLET    Take 1 tablet (500 mg total) by mouth 3 (three) times daily.   OXYCODONE-ACETAMINOPHEN (PERCOCET) 5-325 MG PER TABLET    1 to 2 tablets every 6 hours as needed for pain.    2.  Patient Education/Counseling:  The patient was given appropriate handouts, self care instructions, and instructed in symptomatic relief. Suggested sleeping with head of bed elevated and hot salt water mouthwash.   3.  Follow up:  The patient was told to follow up if no better in 3 to 4 days, if becoming worse in any way, and given some red flag symptoms such as difficulty swallowing or breathing which would prompt immediate return.  Follow up with a dentist as soon as posssible.     Reuben Likesavid C Breiana Stratmann, MD 11/22/13 1257

## 2013-11-22 NOTE — Discharge Instructions (Signed)
Look up the [facility] [employer] for free dental clinics. Http://www.ncdental.org/ncds/Schedule.asp ° °Get there early and be prepared to wait. [employer] and [employer] have dental hygienist schools that provide low cost routine dental care.  ° °Other resources: °[facility] °[address] °[place], [place] °[phone] ° °Patients with Medicaid: °[facility]                      Dental °[address]                                [address] °Phone:  [phone]                                                  Phone:  [phone] ° °If unable to pay or uninsured, contact:  [facility] or [facility] to become qualified for the adult dental clinic. ° °No matter what dental problem you have, it will not get better unless you get good dental care.  If the tooth is not taken care of, your symptoms will come back in time and you will be visiting us again in the Urgent Care Center with a bad toothache.  So, see your dentist as soon as possible.  If you don't have a dentist, we can give you a list of dentists.  Sometimes the most cost effective treatment is removal of the tooth.  This can be done very inexpensively through one of the low cost [facility] such as the facility on [address] in [place] ([phone]).  The downside to this is that you will have one less tooth and this can effect your ability to chew. ° °Some other things that can be done for a dental infection include the following: ° °· Rinse your mouth out with hot salt water (1/2 tsp of table salt and a pinch of baking soda in 8 oz of hot water).  You can do this every 2 or 3 hours. °· Avoid cold foods, beverages, and cold air.  This will make your symptoms worse. °· Sleep with your head elevated.  Sleeping flat will cause your gums and oral tissues to swell and make them hurt more.  You can sleep on several pillows.  Even  better is to sleep in a recliner with your head higher than your heart. °· For mild to moderate pain, you can take Tylenol, ibuprofen, or Aleve. °· External application of heat by a heating pad, hot water bottle, or hot wet towel can help with pain and speed healing.  You can do this every 2 to 3 hours. Do not fall asleep on a heating pad since this can cause a burn.  ° °Go to www.goodrx.com to look up your medications. This will give you a list of where you can find your prescriptions at the most affordable prices.  ° °RESOURCE GUIDE ° °Dental Problems ° °Look up http://www.ncdental.org/ncds/Schedule.asp for a schedule of the [place] [facility] [employer]'s free dental clinics called [facility]. They have clinics all around [place]. Get there early and be prepared to wait.  ° °[facility] °[address]  [place], [place] [address] °[phone] ° °[facility] °  [address] °[place], [place] °[phone] ° °Patients with Medicaid: °[facility]                     [facility] °[address]                                [address] °Phone:  [phone]                                                  Phone:  [phone] ° °If unable to pay or uninsured, contact:  [facility] or [facility] to become qualified for the adult dental clinic. ° °

## 2013-12-22 ENCOUNTER — Emergency Department (HOSPITAL_COMMUNITY)
Admission: EM | Admit: 2013-12-22 | Discharge: 2013-12-22 | Disposition: A | Discharge status: Home / Self Care | Payer: Medicaid Other | Attending: Emergency Medicine | Admitting: Emergency Medicine

## 2013-12-22 ENCOUNTER — Encounter (HOSPITAL_COMMUNITY): Payer: Self-pay | Admitting: Emergency Medicine

## 2013-12-22 DIAGNOSIS — Z91018 Allergy to other foods: Secondary | ICD-10-CM | POA: Insufficient documentation

## 2013-12-22 DIAGNOSIS — Z8742 Personal history of other diseases of the female genital tract: Secondary | ICD-10-CM | POA: Insufficient documentation

## 2013-12-22 DIAGNOSIS — R059 Cough, unspecified: Secondary | ICD-10-CM | POA: Insufficient documentation

## 2013-12-22 DIAGNOSIS — R040 Epistaxis: Secondary | ICD-10-CM | POA: Insufficient documentation

## 2013-12-22 DIAGNOSIS — Z885 Allergy status to narcotic agent status: Secondary | ICD-10-CM | POA: Insufficient documentation

## 2013-12-22 DIAGNOSIS — Z87448 Personal history of other diseases of urinary system: Secondary | ICD-10-CM | POA: Insufficient documentation

## 2013-12-22 DIAGNOSIS — Z79899 Other long term (current) drug therapy: Secondary | ICD-10-CM | POA: Insufficient documentation

## 2013-12-22 DIAGNOSIS — R05 Cough: Secondary | ICD-10-CM | POA: Insufficient documentation

## 2013-12-22 DIAGNOSIS — J069 Acute upper respiratory infection, unspecified: Secondary | ICD-10-CM | POA: Insufficient documentation

## 2013-12-22 MED ORDER — OXYMETAZOLINE HCL 0.05 % NA SOLN: 1.0000 | Freq: Two times a day (BID) | NASAL | Status: DC

## 2013-12-22 MED ORDER — BACITRACIN ZINC 500 UNIT/GM EX OINT: 1.0000 "application " | TOPICAL_OINTMENT | Freq: Two times a day (BID) | CUTANEOUS | Status: DC

## 2013-12-22 NOTE — ED Notes (Signed)
The pt has had a cold and since Wednesday she has had a nose bleed intermittently for the past 2 days.  Her nose bled last approx one hour ago.  None now.  She also has had beer tonight

## 2013-12-22 NOTE — Discharge Instructions (Signed)
Slight generous amounts of antibiotic cream to the inside of the nostril that is bleeding 3 times a day, avoid injuries, sneezing, coughing Or picking her nose. If he continued to have bleeding he should follow up with your nose and throat surgeon.

## 2013-12-22 NOTE — ED Provider Notes (Signed)
CSN: [id number]     Arrival date & time 12/22/13  0321 History   First MD Initiated Contact with Patient 12/22/13 [phone]     Chief Complaint  Patient presents with  . Epistaxis   (Consider location/radiation/quality/duration/timing/severity/associated sxs/prior Treatment) HPI Comments: 28 year old female who presents with a complaint of a right-sided nosebleed. She has had approximately one week of upper respiratory symptoms including sore throat, nasal congestion, runny nose, cough and sneezing. She has been taking TheraFlu and this has gradually been improving. Her symptoms have all but resolved except for a very mild cough and for 2 days she has been having intermittent light right-sided anterior epistaxis. This resolves after she holds pressure, she has been using saline drops to keep hydrated, she is unsure why her nose continues to bleed. This happened again this evening, she is here for evaluation to see if there is anything that needs to be done about the nosebleeds. She denies the use of anticoagulants and has had no trauma injuries and does not pick her nose according to her report  Patient is a 28 y.o. female presenting with nosebleeds. The history is provided by the patient.  Epistaxis Associated symptoms: cough     Past Medical History  Diagnosis Date  . Migraine   . Miscarriage   . Kidney infection    Past Surgical History  Procedure Laterality Date  . Cesarean section  2011  . Cesarean section    . Cesarean section     Family History  Problem Relation Age of Onset  . Asthma Maternal Uncle   . Cancer Maternal Grandmother     breast cancer  . Cancer Paternal Grandmother   . Diabetes Mother   . Hypertension Father   . Diabetes Father    History  Substance Use Topics  . Smoking status: Current Some Day Smoker -- 0.25 packs/day for 7 years    Types: Cigarettes  . Smokeless tobacco: Never Used  . Alcohol Use: Yes     Comment: occasionally   OB History   Grav Para  Term Preterm Abortions TAB SAB Ect Mult Living   2 1 1  1  1   1      Review of Systems  HENT: Positive for nosebleeds.   Respiratory: Positive for cough.     Allergies  Peach; Strawberry; and Vicodin  Home Medications   Current Outpatient Rx  Name  Route  Sig  Dispense  Refill  . acetaminophen (TYLENOL) 500 MG tablet   Oral   Take 500 mg by mouth once.         Marland Kitchen. amoxicillin (AMOXIL) 500 MG capsule   Oral   Take 1 capsule (500 mg total) by mouth 3 (three) times daily.   30 capsule   0     Dispense as written.   . bacitracin ointment   Topical   Apply 1 application topically 2 (two) times daily.   120 g   0   . benzonatate (TESSALON) 200 MG capsule      Puncture capsule and apply to affected tooth every 8 hours.   30 capsule   0   . clindamycin (CLEOCIN) 300 MG capsule   Oral   Take 300 mg by mouth daily.         Marland Kitchen. ibuprofen (ADVIL,MOTRIN) 200 MG tablet   Oral   Take 800 mg by mouth once.          . metroNIDAZOLE (FLAGYL) 500 MG tablet   Oral  Take 1 tablet (500 mg total) by mouth 3 (three) times daily.   30 tablet   0   . oxyCODONE-acetaminophen (PERCOCET) 5-325 MG per tablet      1 to 2 tablets every 6 hours as needed for pain.   20 tablet   0   . oxymetazoline (AFRIN NASAL SPRAY) 0.05 % nasal spray   Each Nare   Place 1 spray into both nostrils 2 (two) times daily.   30 mL   0   . SUMAtriptan (IMITREX) 25 MG tablet   Oral   Take 2 tablets (50 mg total) by mouth every 2 (two) hours as needed for migraine (ongoing headache). Maximum daily dose 200mg    30 tablet   0    BP 124/82  Pulse 90  Temp(Src) 97.5 F (36.4 C) (Oral)  Resp 18  SpO2 97% Physical Exam  Nursing note and vitals reviewed. Constitutional: She appears well-developed and well-nourished. No distress.  HENT:  Head: Normocephalic and atraumatic.  Oropharynx is clear and moist, nasal passages with swollen turbinates, no active bleeding in either the right or the left  nostril, no lymphadenopathy, supple neck, no trismus  Eyes: Conjunctivae and EOM are normal. Pupils are equal, round, and reactive to light. Right eye exhibits no discharge. Left eye exhibits no discharge.  Neck: Normal range of motion. Neck supple. No thyromegaly present.  Cardiovascular: Normal rate, regular rhythm and normal heart sounds.   Pulmonary/Chest: Effort normal and breath sounds normal.  Neurological: She is alert. Coordination normal.  Skin: She is not diaphoretic.    ED Course  Procedures (including critical care time) Labs Review Labs Reviewed - No data to display Imaging Review No results found.  EKG Interpretation   None       MDM   1. Right-sided epistaxis   2. URI (upper respiratory infection)    Well appearing, no active nosebleed, with history of intermittent bleed likely anterior as it is stopping when the patient holds anterior pressure I will have her use topical antibiotic cream in her nostrils to help prevent cracking drying and further bleeding. Will apply first dose, patient informed of how to perform this at home and expresses her understanding.   Meds given in ED:  Medications - No data to display  New Prescriptions   BACITRACIN OINTMENT    Apply 1 application topically 2 (two) times daily.   OXYMETAZOLINE (AFRIN NASAL SPRAY) 0.05 % NASAL SPRAY    Place 1 spray into both nostrils 2 (two) times daily.        Vida Roller, MD 12/22/13 [phone]

## 2014-01-03 ENCOUNTER — Encounter (HOSPITAL_COMMUNITY): Payer: Self-pay | Admitting: Emergency Medicine

## 2014-01-03 ENCOUNTER — Emergency Department (INDEPENDENT_AMBULATORY_CARE_PROVIDER_SITE_OTHER)
Admission: EM | Admit: 2014-01-03 | Discharge: 2014-01-03 | Disposition: A | Discharge status: Home / Self Care | Payer: Medicaid Other | Source: Home / Self Care

## 2014-01-03 DIAGNOSIS — H9203 Otalgia, bilateral: Secondary | ICD-10-CM

## 2014-01-03 DIAGNOSIS — H6980 Other specified disorders of Eustachian tube, unspecified ear: Secondary | ICD-10-CM

## 2014-01-03 DIAGNOSIS — H9209 Otalgia, unspecified ear: Secondary | ICD-10-CM

## 2014-01-03 DIAGNOSIS — R0982 Postnasal drip: Secondary | ICD-10-CM

## 2014-01-03 DIAGNOSIS — K029 Dental caries, unspecified: Secondary | ICD-10-CM

## 2014-01-03 MED ORDER — OXYCODONE-ACETAMINOPHEN 5-325 MG PO TABS
1.0000 | ORAL_TABLET | ORAL | Status: DC | PRN
Start: 2014-01-03 — End: 2014-02-01

## 2014-01-03 MED ORDER — AMOXICILLIN 500 MG PO CAPS: 1000.0000 mg | ORAL_CAPSULE | Freq: Two times a day (BID) | ORAL | Status: DC

## 2014-01-03 NOTE — Discharge Instructions (Signed)
VERY IMPORTRANT YOU ESTABLISH WITH A PRIMARY CARE DOCTOR IN THE OFFICE; USE THE DOCTOR LISTED ON YOUR MEDICAID CARD, FOR PROBLEMS CALL THE SOCIAL SERVICES DEPT FOR HELP. Dental Caries  Dental caries (also called tooth decay) is the most common oral disease. It can occur at any age, but is more common in children and young adults.  HOW DENTAL CARIES DEVELOPS  The process of decay begins when bacteria and foods (particularly sugars and starches) combine in your mouth to produce plaque. Plaque is a substance that sticks to the hard, outer surface of a tooth (enamel). The bacteria in plaque produce acids that attack enamel. These acids may also attack the root surface of a tooth (cementum) if it is exposed. Repeated attacks dissolve these surfaces and create holes in the tooth (cavities). If left untreated, the acids destroy the other layers of the tooth.  RISK FACTORS  Frequent sipping of sugary beverages.   Frequent snacking on sugary and starchy foods, especially those that easily get stuck in the teeth.   Poor oral hygiene.   Dry mouth.   Substance abuse such as methamphetamine abuse.   Broken or poor-fitting dental restorations.   Eating disorders.   Gastroesophageal reflux disease (GERD).   Certain radiation treatments to the head and neck. SYMPTOMS In the early stages of dental caries, symptoms are seldom present. Sometimes white, chalky areas may be seen on the enamel or other tooth layers. In later stages, symptoms may include:  Pits and holes on the enamel.  Toothache after sweet, hot, or cold foods or drinks are consumed.  Pain around the tooth.  Swelling around the tooth. DIAGNOSIS  Most of the time, dental caries is detected during a regular dental checkup. A diagnosis is made after a thorough medical and dental history is taken and the surfaces of your teeth are checked for signs of dental caries. Sometimes special instruments, such as lasers, are used to check  for dental caries. Dental X-ray exams may be taken so that areas not visible to the eye (such as between the contact areas of the teeth) can be checked for cavities.  TREATMENT  If dental caries is in its early stages, it may be reversed with a fluoride treatment or an application of a remineralizing agent at the dental office. Thorough brushing and flossing at home is needed to aid these treatments. If it is in its later stages, treatment depends on the location and extent of tooth destruction:   If a small area of the tooth has been destroyed, the destroyed area will be removed and cavities will be filled with a material such as gold, silver amalgam, or composite resin.   If a large area of the tooth has been destroyed, the destroyed area will be removed and a cap (crown) will be fitted over the remaining tooth structure.   If the center part of the tooth (pulp) is affected, a procedure called a root canal will be needed before a filling or crown can be placed.   If most of the tooth has been destroyed, the tooth may need to be pulled (extracted). HOME CARE INSTRUCTIONS You can prevent, stop, or reverse dental caries at home by practicing good oral hygiene. Good oral hygiene includes:  Thoroughly cleaning your teeth at least twice a day with a toothbrush and dental floss.   Using a fluoride toothpaste. A fluoride mouth rinse may also be used if recommended by your dentist or health care provider.   Restricting the amount  of sugary and starchy foods and sugary liquids you consume.   Avoiding frequent snacking on these foods and sipping of these liquids.   Keeping regular visits with a dentist for checkups and cleanings. PREVENTION   Practice good oral hygiene.  Consider a dental sealant. A dental sealant is a coating material that is applied by your dentist to the pits and grooves of teeth. The sealant prevents food from being trapped in them. It may protect the teeth for several  years.  Ask about fluoride supplements if you live in a community without fluorinated water or with water that has a low fluoride content. Use fluoride supplements as directed by your dentist or health care provider.  Allow fluoride varnish applications to teeth if directed by your dentist or health care provider. Document Released: 07/24/2002 Document Revised: 07/04/2013 Document Reviewed: 11/03/2012 [facility] Patient Information 2014 [place], [place].  Dental Care and Dentist Visits Dental care supports good overall health. Regular dental visits can also help you avoid dental pain, bleeding, infection, and other more serious health problems in the future. It is important to keep the mouth healthy because diseases in the teeth, gums, and other oral tissues can spread to other areas of the body. Some problems, such as diabetes, heart disease, and pre-term labor have been associated with poor oral health.  See your dentist every 6 months. If you experience emergency problems such as a toothache or broken tooth, go to the dentist right away. If you see your dentist regularly, you may catch problems early. It is easier to be treated for problems in the early stages.  WHAT TO EXPECT AT A DENTIST VISIT  Your dentist will look for many common oral health problems and recommend proper treatment. At your regular dental visit, you can expect:  Gentle cleaning of the teeth and gums. This includes scraping and polishing. This helps to remove the sticky substance around the teeth and gums (plaque). Plaque forms in the mouth shortly after eating. Over time, plaque hardens on the teeth as tartar. If tartar is not removed regularly, it can cause problems. Cleaning also helps remove stains.  Periodic X-rays. These pictures of the teeth and supporting bone will help your dentist assess the health of your teeth.  Periodic fluoride treatments. Fluoride is a natural mineral shown to help strengthen teeth. Fluoride  treatmentinvolves applying a fluoride gel or varnish to the teeth. It is most commonly done in children.  Examination of the mouth, tongue, jaws, teeth, and gums to look for any oral health problems, such as:  Cavities (dental caries). This is decay on the tooth caused by plaque, sugar, and acid in the mouth. It is best to catch a cavity when it is small.  Inflammation of the gums caused by plaque buildup (gingivitis).  Problems with the mouth or malformed or misaligned teeth.  Oral cancer or other diseases of the soft tissues or jaws. KEEP YOUR TEETH AND GUMS HEALTHY For healthy teeth and gums, follow these general guidelines as well as your dentist's specific advice:  Have your teeth professionally cleaned at the dentist every 6 months.  Brush twice daily with a fluoride toothpaste.  Floss your teeth daily.  Ask your dentist if you need fluoride supplements, treatments, or fluoride toothpaste.  Eat a healthy diet. Reduce foods and drinks with added sugar.  Avoid smoking. TREATMENT FOR ORAL HEALTH PROBLEMS If you have oral health problems, treatment varies depending on the conditions present in your teeth and gums.  Your caregiver  will most likely recommend good oral hygiene at each visit.  For cavities, gingivitis, or other oral health disease, your caregiver will perform a procedure to treat the problem. This is typically done at a separate appointment. Sometimes your caregiver will refer you to another dental specialist for specific tooth problems or for surgery. SEEK IMMEDIATE DENTAL CARE IF:  You have pain, bleeding, or soreness in the gum, tooth, jaw, or mouth area.  A permanent tooth becomes loose or separated from the gum socket.  You experience a blow or injury to the mouth or jaw area. Document Released: 07/14/2011 Document Revised: 01/24/2012 Document Reviewed: 07/14/2011 [facility] Patient Information 2014 [place], [place].  Otalgia The most common reason for  this in children is an infection of the middle ear. Pain from the middle ear is usually caused by a build-up of fluid and pressure behind the eardrum. Pain from an earache can be sharp, dull, or burning. The pain may be temporary or constant. The middle ear is connected to the nasal passages by a short narrow tube called the Eustachian tube. The Eustachian tube allows fluid to drain out of the middle ear, and helps keep the pressure in your ear equalized. CAUSES  A cold or allergy can block the Eustachian tube with inflammation and the build-up of secretions. This is especially likely in small children, because their Eustachian tube is shorter and more horizontal. When the Eustachian tube closes, the normal flow of fluid from the middle ear is stopped. Fluid can accumulate and cause stuffiness, pain, hearing loss, and an ear infection if germs start growing in this area. SYMPTOMS  The symptoms of an ear infection may include fever, ear pain, fussiness, increased crying, and irritability. Many children will have temporary and minor hearing loss during and right after an ear infection. Permanent hearing loss is rare, but the risk increases the more infections a child has. Other causes of ear pain include retained water in the outer ear canal from swimming and bathing. Ear pain in adults is less likely to be from an ear infection. Ear pain may be referred from other locations. Referred pain may be from the joint between your jaw and the skull. It may also come from a tooth problem or problems in the neck. Other causes of ear pain include:  A foreign body in the ear.  Outer ear infection.  Sinus infections.  Impacted ear wax.  Ear injury.  Arthritis of the jaw or TMJ problems.  Middle ear infection.  Tooth infections.  Sore throat with pain to the ears. DIAGNOSIS  Your caregiver can usually make the diagnosis by examining you. Sometimes other special studies, including x-rays and lab work may be  necessary. TREATMENT   If antibiotics were prescribed, use them as directed and finish them even if you or your child's symptoms seem to be improved.  Sometimes PE tubes are needed in children. These are little plastic tubes which are put into the eardrum during a simple surgical procedure. They allow fluid to drain easier and allow the pressure in the middle ear to equalize. This helps relieve the ear pain caused by pressure changes. HOME CARE INSTRUCTIONS   Only take over-the-counter or prescription medicines for pain, discomfort, or fever as directed by your caregiver. DO NOT GIVE CHILDREN ASPIRIN because of the association of Reye's Syndrome in children taking aspirin.  Use a cold pack applied to the outer ear for 15-20 minutes, 03-04 times per day or as needed may reduce pain. Do  not apply ice directly to the skin. You may cause frost bite.  Over-the-counter ear drops used as directed may be effective. Your caregiver may sometimes prescribe ear drops.  Resting in an upright position may help reduce pressure in the middle ear and relieve pain.  Ear pain caused by rapidly descending from high altitudes can be relieved by swallowing or chewing gum. Allowing infants to suck on a bottle during airplane travel can help.  Do not smoke in the house or near children. If you are unable to quit smoking, smoke outside.  Control allergies. SEEK IMMEDIATE MEDICAL CARE IF:   You or your child are becoming sicker.  Pain or fever relief is not obtained with medicine.  You or your child's symptoms (pain, fever, or irritability) do not improve within 24 to 48 hours or as instructed.  Severe pain suddenly stops hurting. This may indicate a ruptured eardrum.  You or your children develop new problems such as severe headaches, stiff neck, difficulty swallowing, or swelling of the face or around the ear. Document Released: 06/18/2004 Document Revised: 01/24/2012 Document Reviewed: 10/23/2008 [facility]  Patient Information 2014 [place], [place].

## 2014-01-03 NOTE — ED Provider Notes (Signed)
CSN: [id number]     Arrival date & time 01/03/14  1451 History   First MD Initiated Contact with Patient 01/03/14 1551     Chief Complaint  Patient presents with  . Otalgia     (Consider location/radiation/quality/duration/timing/severity/associated sxs/prior Treatment) HPI Comments: 28 year old female complaining of bilateral earaches for 4 days. She has been treating it to her badly diseased downs and rotten teeth. She states she has an appointment with the dentist next week to have some of her teeth pulled. She has numerous documented visits to the urgent care in the emergency department for tooth pain as well as many other nonemergent conditions.   Past Medical History  Diagnosis Date  . Migraine   . Miscarriage   . Kidney infection    Past Surgical History  Procedure Laterality Date  . Cesarean section  2011  . Cesarean section    . Cesarean section     Family History  Problem Relation Age of Onset  . Asthma Maternal Uncle   . Cancer Maternal Grandmother     breast cancer  . Cancer Paternal Grandmother   . Diabetes Mother   . Hypertension Father   . Diabetes Father    History  Substance Use Topics  . Smoking status: Current Some Day Smoker -- 0.25 packs/day for 7 years    Types: Cigarettes  . Smokeless tobacco: Never Used  . Alcohol Use: Yes     Comment: occasionally   OB History   Grav Para Term Preterm Abortions TAB SAB Ect Mult Living   2 1 1  1  1   1      Review of Systems  Constitutional: Negative.   HENT: Positive for dental problem and ear pain. Negative for postnasal drip, rhinorrhea and sore throat.   Respiratory: Negative.   Cardiovascular: Negative.   Genitourinary: Negative.   Neurological: Negative.       Allergies  Peach; Strawberry; and Vicodin  Home Medications   Current Outpatient Rx  Name  Route  Sig  Dispense  Refill  . SUMAtriptan (IMITREX) 25 MG tablet   Oral   Take 2 tablets (50 mg total) by mouth every 2 (two) hours as  needed for migraine (ongoing headache). Maximum daily dose 200mg    30 tablet   0   . acetaminophen (TYLENOL) 500 MG tablet   Oral   Take 500 mg by mouth once.         Marland Kitchen amoxicillin (AMOXIL) 500 MG capsule   Oral   Take 1 capsule (500 mg total) by mouth 3 (three) times daily.   30 capsule   0     Dispense as written.   Marland Kitchen amoxicillin (AMOXIL) 500 MG capsule   Oral   Take 2 capsules (1,000 mg total) by mouth 2 (two) times daily.   40 capsule   0   . bacitracin ointment   Topical   Apply 1 application topically 2 (two) times daily.   120 g   0   . benzonatate (TESSALON) 200 MG capsule      Puncture capsule and apply to affected tooth every 8 hours.   30 capsule   0   . clindamycin (CLEOCIN) 300 MG capsule   Oral   Take 300 mg by mouth daily.         Marland Kitchen ibuprofen (ADVIL,MOTRIN) 200 MG tablet   Oral   Take 800 mg by mouth once.          . metroNIDAZOLE (FLAGYL)  500 MG tablet   Oral   Take 1 tablet (500 mg total) by mouth 3 (three) times daily.   30 tablet   0   . oxyCODONE-acetaminophen (PERCOCET) 5-325 MG per tablet      1 to 2 tablets every 6 hours as needed for pain.   20 tablet   0   . oxyCODONE-acetaminophen (ROXICET) 5-325 MG per tablet   Oral   Take 1 tablet by mouth every 4 (four) hours as needed for severe pain.   12 tablet   0   . oxymetazoline (AFRIN NASAL SPRAY) 0.05 % nasal spray   Each Nare   Place 1 spray into both nostrils 2 (two) times daily.   30 mL   0    BP 115/76  Pulse 88  Temp(Src) 98.3 F (36.8 C) (Oral)  Resp 18  SpO2 99% Physical Exam  Nursing note and vitals reviewed. Constitutional: She is oriented to person, place, and time. She appears well-developed and well-nourished. No distress.  HENT:  Mouth/Throat: Oropharynx is clear and moist. No oropharyngeal exudate.  Bilateral TMs are retracted. Right TM is severely retracted with some scarring and discoloration suggestive of serous otitis. She has very poor dental  repair. There are several decayed teeth involving the pulp with fragments of enamel remaining. There is minor erythema of the gingiva associated with the involved teeth but no abscess formation is seen.  Eyes: Conjunctivae and EOM are normal.  Neck: Normal range of motion. Neck supple.  Pulmonary/Chest: Effort normal and breath sounds normal. She has no wheezes.  Lymphadenopathy:    She has no cervical adenopathy.  Neurological: She is alert and oriented to person, place, and time.  Skin: Skin is warm and dry.    ED Course  Procedures (including critical care time) Labs Review Labs Reviewed - No data to display Imaging Review No results found.    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 Ibuprofen for pain Amoxicillin 4 otitis and localized dental infection. She is instructed to followup with a PCP listed on her Medicaid card. Take Sudafed PE along with Robitussin.   Hayden Rasmussenavid Alaira Level, NP 01/03/14 [phone]

## 2014-01-03 NOTE — ED Notes (Signed)
Pt c/o bilateral ear pain onset Monday Denies cold sxs, f/v/n/d Taking ibup w/no relief Alert w/no signs of acute distress.

## 2014-01-05 NOTE — ED Provider Notes (Signed)
Medical screening examination/treatment/procedure(s) were performed by non-physician practitioner and as supervising physician I was immediately available for consultation/collaboration.  Homer Pfeifer, M.D.  Syleena Mchan C Calani Gick, MD 01/05/14 0905 

## 2014-02-01 ENCOUNTER — Emergency Department (HOSPITAL_COMMUNITY)
Admission: EM | Admit: 2014-02-01 | Discharge: 2014-02-01 | Disposition: A | Discharge status: Home / Self Care | Payer: Medicaid Other | Attending: Emergency Medicine | Admitting: Emergency Medicine

## 2014-02-01 ENCOUNTER — Encounter (HOSPITAL_COMMUNITY): Payer: Self-pay | Admitting: Emergency Medicine

## 2014-02-01 DIAGNOSIS — R519 Headache, unspecified: Secondary | ICD-10-CM

## 2014-02-01 DIAGNOSIS — G43909 Migraine, unspecified, not intractable, without status migrainosus: Secondary | ICD-10-CM | POA: Insufficient documentation

## 2014-02-01 DIAGNOSIS — K0889 Other specified disorders of teeth and supporting structures: Secondary | ICD-10-CM

## 2014-02-01 DIAGNOSIS — R51 Headache: Secondary | ICD-10-CM

## 2014-02-01 DIAGNOSIS — Z87448 Personal history of other diseases of urinary system: Secondary | ICD-10-CM | POA: Insufficient documentation

## 2014-02-01 DIAGNOSIS — K089 Disorder of teeth and supporting structures, unspecified: Secondary | ICD-10-CM | POA: Insufficient documentation

## 2014-02-01 DIAGNOSIS — G8929 Other chronic pain: Secondary | ICD-10-CM | POA: Insufficient documentation

## 2014-02-01 DIAGNOSIS — F17200 Nicotine dependence, unspecified, uncomplicated: Secondary | ICD-10-CM | POA: Insufficient documentation

## 2014-02-01 DIAGNOSIS — K029 Dental caries, unspecified: Secondary | ICD-10-CM | POA: Insufficient documentation

## 2014-02-01 DIAGNOSIS — Z792 Long term (current) use of antibiotics: Secondary | ICD-10-CM | POA: Insufficient documentation

## 2014-02-01 MED ORDER — SUMATRIPTAN SUCCINATE 25 MG PO TABS: 50.0000 mg | ORAL_TABLET | ORAL | Status: DC | PRN

## 2014-02-01 MED ORDER — AMOXICILLIN 500 MG PO CAPS: 500.0000 mg | ORAL_CAPSULE | Freq: Three times a day (TID) | ORAL | Status: DC

## 2014-02-01 MED ORDER — TRAMADOL HCL 50 MG PO TABS: 50.0000 mg | ORAL_TABLET | Freq: Four times a day (QID) | ORAL | Status: DC | PRN

## 2014-02-01 NOTE — ED Notes (Signed)
C/o right lower dental pain and left upper dental pain. States has appointment for extractions on 4/2. Requested "that nerve numbing shot".

## 2014-02-01 NOTE — ED Provider Notes (Signed)
Medical screening examination/treatment/procedure(s) were performed by non-physician practitioner and as supervising physician I was immediately available for consultation/collaboration.   EKG Interpretation None       Yeng Perz, MD 02/01/14 1649 

## 2014-02-01 NOTE — ED Provider Notes (Signed)
CSN: [id number]     Arrival date & time 02/01/14  1102 History  This chart was scribed for non-physician practitioner Dorthula Matas, PA-C working with Glynn Octave, MD by Leone Payor, ED Scribe. This patient was seen in room TR07C/TR07C and the patient's care was started at 11:14 AM.     No chief complaint on file.     The history is provided by the patient. No language interpreter was used.    HPI Comments: Amy Mcfarland is a 28 y.o. female who presents to the Emergency Department complaining of constant, gradually worsening right lower and left upper dental pain that began 3-4 days ago. She describes this pain as throbbing. She has an appointment for extractions on 02/14/14 but states the pain is becoming unbearable. She has tried Oragel without relief. She has had a dental block in the past with relief. She denies fever.    Past Medical History  Diagnosis Date  . Migraine   . Miscarriage   . Kidney infection    Past Surgical History  Procedure Laterality Date  . Cesarean section  2011  . Cesarean section    . Cesarean section     Family History  Problem Relation Age of Onset  . Asthma Maternal Uncle   . Cancer Maternal Grandmother     breast cancer  . Cancer Paternal Grandmother   . Diabetes Mother   . Hypertension Father   . Diabetes Father    History  Substance Use Topics  . Smoking status: Current Some Day Smoker -- 0.25 packs/day for 7 years    Types: Cigarettes  . Smokeless tobacco: Never Used  . Alcohol Use: Yes     Comment: occasionally   OB History   Grav Para Term Preterm Abortions TAB SAB Ect Mult Living   2 1 1  1  1   1      Review of Systems  Constitutional: Negative for fever.  HENT: Positive for dental problem.   Gastrointestinal: Negative for nausea and vomiting.      Allergies  Peach; Strawberry; and Vicodin  Home Medications   Current Outpatient Rx  Name  Route  Sig  Dispense  Refill  . bacitracin ointment   Topical   Apply 1  application topically 2 (two) times daily.   120 g   0   . ibuprofen (ADVIL,MOTRIN) 200 MG tablet   Oral   Take 800 mg by mouth every 6 (six) hours as needed for moderate pain.          . medroxyPROGESTERone (DEPO-PROVERA) 150 MG/ML injection   Intramuscular   Inject 150 mg into the muscle every 3 (three) months.         Marland Kitchen oxymetazoline (AFRIN) 0.05 % nasal spray   Each Nare   Place 1 spray into both nostrils 2 (two) times daily as needed for congestion.         . SUMAtriptan (IMITREX) 25 MG tablet   Oral   Take 2 tablets (50 mg total) by mouth every 2 (two) hours as needed for migraine (ongoing headache). Maximum daily dose 200mg    30 tablet   0   . traMADol (ULTRAM) 50 MG tablet   Oral   Take 1 tablet (50 mg total) by mouth every 6 (six) hours as needed.   15 tablet   0    BP 128/88  Pulse 76  Temp(Src) 97.4 F (36.3 C) (Oral)  SpO2 100% Physical Exam  Nursing note and vitals  reviewed. Constitutional: She is oriented to person, place, and time. She appears well-developed and well-nourished. No distress.  HENT:  Head: Normocephalic and atraumatic.  Mouth/Throat: Uvula is midline, oropharynx is clear and moist and mucous membranes are normal. Normal dentition. Dental caries (Pts tooth shows no obvious abscess but moderate to severe tenderness to palpation of marked tooth) present. No uvula swelling.    Eyes: Pupils are equal, round, and reactive to light.  Neck: Trachea normal, normal range of motion and full passive range of motion without pain. Neck supple.  Cardiovascular: Normal rate, regular rhythm, normal heart sounds and normal pulses.   Pulmonary/Chest: Effort normal and breath sounds normal. No respiratory distress. Chest wall is not dull to percussion. She exhibits no tenderness, no crepitus, no edema, no deformity and no retraction.  Abdominal: Normal appearance. She exhibits no distension.  Musculoskeletal: Normal range of motion.  Neurological: She  is alert and oriented to person, place, and time. She has normal strength.  Skin: Skin is warm, dry and intact. She is not diaphoretic.  Psychiatric: She has a normal mood and affect. Her speech is normal. Cognition and memory are normal.    ED Course  NERVE BLOCK Date/Time: 02/01/2014 11:41 AM Performed by: Dorthula MatasGREENE, Shaneeka Scarboro G Authorized by: Dorthula MatasGREENE, Sherl Yzaguirre G Consent: Verbal consent obtained. Risks and benefits: risks, benefits and alternatives were discussed Consent given by: patient Patient identity confirmed: verbally with patient Indications: pain relief Nerve block body site: rght lower back molars. Laterality: right Local anesthetic: lidocaine 1% without epinephrine Anesthetic total: 2 ml Outcome: pain improved Patient tolerance: Patient tolerated the procedure well with no immediate complications. Comments: Nerve block of bottom right lower jaw. Successful pt has complete relief of pain.   (including critical care time)  DIAGNOSTIC STUDIES: Oxygen Saturation is 100% on RA, normal by my interpretation.    COORDINATION OF CARE: 11:26 AM Will perform dental block to relieve pain. Discussed treatment plan with pt at bedside and pt agreed to plan. Patient states her pain is decreasing after the injection.    Labs Review Labs Reviewed - No data to display Imaging Review No results found.   EKG Interpretation None      MDM   Final diagnoses:  Chronic headache  Pain, dental   Patient has dental pain. No emergent s/sx's present. Patent airway. No trismus.  Will be given pain medication and antibiotics. I discussed the need to call dentist within 24/48 hours for follow-up. Dental referral given. Return to ED precautions given.  Pt voiced understanding and has agreed to follow-up.   28 y.o.Amy Mcfarland's evaluation in the Emergency Department is complete. It has been determined that no acute conditions requiring further emergency intervention are present at this  time. The patient/guardian have been advised of the diagnosis and plan. We have discussed signs and symptoms that warrant return to the ED, such as changes or worsening in symptoms.  Vital signs are stable at discharge. Filed Vitals:   02/01/14 1112  BP: 128/88  Pulse: 76  Temp: 97.4 F (36.3 C)    Patient/guardian has voiced understanding and agreed to follow-up with the PCP or specialist.  I personally performed the services described in this documentation, which was scribed in my presence. The recorded information has been reviewed and is accurate.   Dorthula Matasiffany G Kimberle Stanfill, PA-C 02/01/14 1143

## 2014-02-01 NOTE — Discharge Instructions (Signed)
Dental Pain °A tooth ache may be caused by cavities (tooth decay). Cavities expose the nerve of the tooth to air and hot or cold temperatures. It may come from an infection or abscess (also called a boil or furuncle) around your tooth. It is also often caused by dental caries (tooth decay). This causes the pain you are having. °DIAGNOSIS  °Your caregiver can diagnose this problem by exam. °TREATMENT  °· If caused by an infection, it may be treated with medications which kill germs (antibiotics) and pain medications as prescribed by your caregiver. Take medications as directed. °· Only take over-the-counter or prescription medicines for pain, discomfort, or fever as directed by your caregiver. °· Whether the tooth ache today is caused by infection or dental disease, you should see your dentist as soon as possible for further care. °SEEK MEDICAL CARE IF: °The exam and treatment you received today has been provided on an emergency basis only. This is not a substitute for complete medical or dental care. If your problem worsens or new problems (symptoms) appear, and you are unable to meet with your dentist, call or return to this location. °SEEK IMMEDIATE MEDICAL CARE IF:  °· You have a fever. °· You develop redness and swelling of your face, jaw, or neck. °· You are unable to open your mouth. °· You have severe pain uncontrolled by pain medicine. °MAKE SURE YOU:  °· Understand these instructions. °· Will watch your condition. °· Will get help right away if you are not doing well or get worse. °Document Released: 11/01/2005 Document Revised: 01/24/2012 Document Reviewed: 06/19/2008 °[employer]® Patient Information ©2014 [employer]. ° °Dental Care and Dentist Visits °Dental care supports good overall health. Regular dental visits can also help you avoid dental pain, bleeding, infection, and other more serious health problems in the future. It is important to keep the mouth healthy because diseases in the teeth, gums,  and other oral tissues can spread to other areas of the body. Some problems, such as diabetes, heart disease, and pre-term labor have been associated with poor oral health.  °See your dentist every 6 months. If you experience emergency problems such as a toothache or broken tooth, go to the dentist right away. If you see your dentist regularly, you may catch problems early. It is easier to be treated for problems in the early stages.  °WHAT TO EXPECT AT A DENTIST VISIT  °Your dentist will look for many common oral health problems and recommend proper treatment. At your regular dental visit, you can expect: °· Gentle cleaning of the teeth and gums. This includes scraping and polishing. This helps to remove the sticky substance around the teeth and gums (plaque). Plaque forms in the mouth shortly after eating. Over time, plaque hardens on the teeth as tartar. If tartar is not removed regularly, it can cause problems. Cleaning also helps remove stains. °· Periodic X-rays. These pictures of the teeth and supporting bone will help your dentist assess the health of your teeth. °· Periodic fluoride treatments. Fluoride is a natural mineral shown to help strengthen teeth. Fluoride treatment involves applying a fluoride gel or varnish to the teeth. It is most commonly done in children. °· Examination of the mouth, tongue, jaws, teeth, and gums to look for any oral health problems, such as: °· Cavities (dental caries). This is decay on the tooth caused by plaque, sugar, and acid in the mouth. It is best to catch a cavity when it is small. °· Inflammation of the gums   caused by plaque buildup (gingivitis). °· Problems with the mouth or malformed or misaligned teeth. °· Oral cancer or other diseases of the soft tissues or jaws.  °KEEP YOUR TEETH AND GUMS HEALTHY °For healthy teeth and gums, follow these general guidelines as well as your dentist's specific advice: °· Have your teeth professionally cleaned at the dentist every 6  months. °· Brush twice daily with a fluoride toothpaste. °· Floss your teeth daily.  °· Ask your dentist if you need fluoride supplements, treatments, or fluoride toothpaste. °· Eat a healthy diet. Reduce foods and drinks with added sugar. °· Avoid smoking. °TREATMENT FOR ORAL HEALTH PROBLEMS °If you have oral health problems, treatment varies depending on the conditions present in your teeth and gums. °· Your caregiver will most likely recommend good oral hygiene at each visit. °· For cavities, gingivitis, or other oral health disease, your caregiver will perform a procedure to treat the problem. This is typically done at a separate appointment. Sometimes your caregiver will refer you to another dental specialist for specific tooth problems or for surgery. °SEEK IMMEDIATE DENTAL CARE IF: °· You have pain, bleeding, or soreness in the gum, tooth, jaw, or mouth area. °· A permanent tooth becomes loose or separated from the gum socket. °· You experience a blow or injury to the mouth or jaw area. °Document Released: 07/14/2011 Document Revised: 01/24/2012 Document Reviewed: 07/14/2011 °ExitCare® Patient Information ©2014 [employer]. ° °

## 2014-02-19 ENCOUNTER — Encounter (HOSPITAL_COMMUNITY): Payer: Self-pay | Admitting: Emergency Medicine

## 2014-02-19 ENCOUNTER — Emergency Department (INDEPENDENT_AMBULATORY_CARE_PROVIDER_SITE_OTHER)
Admission: EM | Admit: 2014-02-19 | Discharge: 2014-02-19 | Disposition: A | Discharge status: Home / Self Care | Payer: Medicaid Other | Source: Home / Self Care | Attending: Family Medicine | Admitting: Family Medicine

## 2014-02-19 DIAGNOSIS — J302 Other seasonal allergic rhinitis: Secondary | ICD-10-CM

## 2014-02-19 DIAGNOSIS — J309 Allergic rhinitis, unspecified: Secondary | ICD-10-CM

## 2014-02-19 MED ORDER — METHYLPREDNISOLONE ACETATE 40 MG/ML IJ SUSP: 80.0000 mg | Freq: Once | INTRAMUSCULAR | Status: DC

## 2014-02-19 MED ORDER — DIPHENHYDRAMINE HCL 50 MG/ML IJ SOLN: 50.0000 mg | Freq: Once | INTRAMUSCULAR | Status: DC

## 2014-02-19 MED ORDER — ONDANSETRON 4 MG PO TBDP: 8.0000 mg | ORAL_TABLET | Freq: Once | ORAL | Status: DC

## 2014-02-19 MED ORDER — DICLOFENAC POTASSIUM 50 MG PO TABS: 50.0000 mg | ORAL_TABLET | Freq: Three times a day (TID) | ORAL | Status: DC

## 2014-02-19 MED ORDER — FEXOFENADINE HCL 180 MG PO TABS: 180.0000 mg | ORAL_TABLET | Freq: Every day | ORAL | Status: DC

## 2014-02-19 MED ORDER — FLUTICASONE PROPIONATE 50 MCG/ACT NA SUSP: 1.0000 | Freq: Two times a day (BID) | NASAL | Status: DC

## 2014-02-19 MED ORDER — AMOXICILLIN 500 MG PO CAPS: 500.0000 mg | ORAL_CAPSULE | Freq: Three times a day (TID) | ORAL | Status: DC

## 2014-02-19 NOTE — ED Notes (Signed)
Pt c/o cold sxs onset Thursday Sxs include: HA, congestion, cough, sneezing Also c/o dental pain... Seen on 3/20 for similar sxs Alert w/no signs of acute distress.

## 2014-02-19 NOTE — ED Provider Notes (Signed)
CSN: [id number]     Arrival date & time 02/19/14  1142 History   First MD Initiated Contact with Patient 02/19/14 1237     Chief Complaint  Patient presents with  . URI   (Consider location/radiation/quality/duration/timing/severity/associated sxs/prior Treatment) Patient is a 28 y.o. female presenting with URI. The history is provided by the patient and the spouse.  URI Presenting symptoms: congestion, cough and rhinorrhea   Presenting symptoms: no fever   Severity:  Moderate Chronicity:  New Associated symptoms: headaches and sneezing   Associated symptoms: no wheezing     Past Medical History  Diagnosis Date  . Migraine   . Miscarriage   . Kidney infection    Past Surgical History  Procedure Laterality Date  . Cesarean section  2011  . Cesarean section    . Cesarean section     Family History  Problem Relation Age of Onset  . Asthma Maternal Uncle   . Cancer Maternal Grandmother     breast cancer  . Cancer Paternal Grandmother   . Diabetes Mother   . Hypertension Father   . Diabetes Father    History  Substance Use Topics  . Smoking status: Current Some Day Smoker -- 0.25 packs/day for 7 years    Types: Cigarettes  . Smokeless tobacco: Never Used  . Alcohol Use: Yes     Comment: occasionally   OB History   Grav Para Term Preterm Abortions TAB SAB Ect Mult Living   2 1 1  1  1   1      Review of Systems  Constitutional: Negative.  Negative for fever.  HENT: Positive for congestion, postnasal drip, rhinorrhea and sneezing.   Respiratory: Positive for cough. Negative for wheezing.   Gastrointestinal: Negative.   Neurological: Positive for headaches.    Allergies  Peach; Strawberry; and Vicodin  Home Medications   Current Outpatient Rx  Name  Route  Sig  Dispense  Refill  . amoxicillin (AMOXIL) 500 MG capsule   Oral   Take 1 capsule (500 mg total) by mouth 3 (three) times daily.   21 capsule   0   . traMADol (ULTRAM) 50 MG tablet   Oral   Take  1 tablet (50 mg total) by mouth every 6 (six) hours as needed.   15 tablet   0   . amoxicillin (AMOXIL) 500 MG capsule   Oral   Take 1 capsule (500 mg total) by mouth 3 (three) times daily.   30 capsule   0   . bacitracin ointment   Topical   Apply 1 application topically 2 (two) times daily.   120 g   0   . diclofenac (CATAFLAM) 50 MG tablet   Oral   Take 1 tablet (50 mg total) by mouth 3 (three) times daily. For tooth pain   30 tablet   0   . fexofenadine (ALLEGRA) 180 MG tablet   Oral   Take 1 tablet (180 mg total) by mouth daily.   30 tablet   1   . fluticasone (FLONASE) 50 MCG/ACT nasal spray   Each Nare   Place 1 spray into both nostrils 2 (two) times daily.   1 g   2   . ibuprofen (ADVIL,MOTRIN) 200 MG tablet   Oral   Take 800 mg by mouth every 6 (six) hours as needed for moderate pain.          . medroxyPROGESTERone (DEPO-PROVERA) 150 MG/ML injection   Intramuscular  Inject 150 mg into the muscle every 3 (three) months.         Marland Kitchen. oxymetazoline (AFRIN) 0.05 % nasal spray   Each Nare   Place 1 spray into both nostrils 2 (two) times daily as needed for congestion.         . SUMAtriptan (IMITREX) 25 MG tablet   Oral   Take 2 tablets (50 mg total) by mouth every 2 (two) hours as needed for migraine (ongoing headache). Maximum daily dose 200mg    30 tablet   0    BP 123/78  Pulse 86  Temp(Src) 97.9 F (36.6 C) (Oral)  Resp 18  SpO2 98% Physical Exam  Nursing note and vitals reviewed. Constitutional: She is oriented to person, place, and time. She appears well-developed and well-nourished.  HENT:  Head: Normocephalic.  Right Ear: External ear normal.  Left Ear: External ear normal.  Mouth/Throat: Oropharynx is clear and moist.  Eyes: Conjunctivae are normal. Pupils are equal, round, and reactive to light.  Neck: Normal range of motion. Neck supple.  Cardiovascular: Normal heart sounds.   Pulmonary/Chest: Breath sounds normal.   Neurological: She is alert and oriented to person, place, and time.  Skin: Skin is warm and dry.    ED Course  Procedures (including critical care time) Labs Review Labs Reviewed - No data to display Imaging Review No results found.   MDM   1. Seasonal allergies        Linna HoffJames D Emerson Barretto, MD 02/19/14 1407

## 2014-02-19 NOTE — Discharge Instructions (Signed)
Take medicine as prescribed, see your dentist as soon as possible °

## 2014-02-25 ENCOUNTER — Encounter (HOSPITAL_COMMUNITY): Payer: Self-pay | Admitting: Emergency Medicine

## 2014-02-25 ENCOUNTER — Emergency Department (HOSPITAL_COMMUNITY): Payer: Medicaid Other

## 2014-02-25 ENCOUNTER — Emergency Department (HOSPITAL_COMMUNITY)
Admission: EM | Admit: 2014-02-25 | Discharge: 2014-02-25 | Disposition: A | Discharge status: Home / Self Care | Payer: Medicaid Other | Attending: Emergency Medicine | Admitting: Emergency Medicine

## 2014-02-25 DIAGNOSIS — Z87448 Personal history of other diseases of urinary system: Secondary | ICD-10-CM | POA: Insufficient documentation

## 2014-02-25 DIAGNOSIS — X500XXA Overexertion from strenuous movement or load, initial encounter: Secondary | ICD-10-CM | POA: Insufficient documentation

## 2014-02-25 DIAGNOSIS — M779 Enthesopathy, unspecified: Secondary | ICD-10-CM

## 2014-02-25 DIAGNOSIS — M65849 Other synovitis and tenosynovitis, unspecified hand: Principal | ICD-10-CM

## 2014-02-25 DIAGNOSIS — Y9389 Activity, other specified: Secondary | ICD-10-CM | POA: Insufficient documentation

## 2014-02-25 DIAGNOSIS — Z888 Allergy status to other drugs, medicaments and biological substances status: Secondary | ICD-10-CM | POA: Insufficient documentation

## 2014-02-25 DIAGNOSIS — X503XXA Overexertion from repetitive movements, initial encounter: Secondary | ICD-10-CM | POA: Insufficient documentation

## 2014-02-25 DIAGNOSIS — Z8669 Personal history of other diseases of the nervous system and sense organs: Secondary | ICD-10-CM | POA: Insufficient documentation

## 2014-02-25 DIAGNOSIS — Z91018 Allergy to other foods: Secondary | ICD-10-CM | POA: Insufficient documentation

## 2014-02-25 DIAGNOSIS — F17200 Nicotine dependence, unspecified, uncomplicated: Secondary | ICD-10-CM | POA: Insufficient documentation

## 2014-02-25 DIAGNOSIS — M65839 Other synovitis and tenosynovitis, unspecified forearm: Secondary | ICD-10-CM | POA: Insufficient documentation

## 2014-02-25 DIAGNOSIS — Y929 Unspecified place or not applicable: Secondary | ICD-10-CM | POA: Insufficient documentation

## 2014-02-25 MED ORDER — MELOXICAM 7.5 MG PO TABS: 7.5000 mg | ORAL_TABLET | Freq: Every day | ORAL | Status: DC

## 2014-02-25 NOTE — ED Provider Notes (Signed)
CSN: [id number]     Arrival date & time 02/25/14  1308 History  This chart was scribed for non-physician practitioner, Trixie DredgeEmily Mindy Behnken, PA-C working with Gerhard Munchobert Lockwood, MD by Greggory StallionKayla Andersen, ED scribe. This patient was seen in room TR08C/TR08C and the patient's care was started at 2:19 PM.   Chief Complaint  Patient presents with  . Finger Injury   The history is provided by the patient. No language interpreter was used.   HPI Comments: Amy Mcfarland is a 28 y.o. female who presents to the Emergency Department complaining of gradual onset, constant right thumb pain that started 2 days ago. 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 Movement worsen the pain. She has taken 800 mg ibuprofen and 440 mg naproxen (not together) with no relief. Pt is right hand dominant.   Past Medical History  Diagnosis Date  . Migraine   . Miscarriage   . Kidney infection    Past Surgical History  Procedure Laterality Date  . Cesarean section  2011  . Cesarean section    . Cesarean section     Family History  Problem Relation Age of Onset  . Asthma Maternal Uncle   . Cancer Maternal Grandmother     breast cancer  . Cancer Paternal Grandmother   . Diabetes Mother   . Hypertension Father   . Diabetes Father    History  Substance Use Topics  . Smoking status: Current Some Day Smoker -- 0.25 packs/day for 7 years    Types: Cigarettes  . Smokeless tobacco: Never Used  . Alcohol Use: Yes     Comment: occasionally   OB History   Grav Para Term Preterm Abortions TAB SAB Ect Mult Living   2 1 1  1  1   1      Review of Systems  Musculoskeletal: Positive for arthralgias.  All other systems reviewed and are negative.  Allergies  Peach; Strawberry; and Vicodin  Home Medications   Current Outpatient Rx  Name  Route  Sig  Dispense  Refill  . amoxicillin (AMOXIL) 500 MG capsule   Oral   Take 1 capsule (500 mg total) by mouth 3 (three) times daily.   21 capsule   0   . amoxicillin (AMOXIL) 500 MG capsule   Oral   Take 1 capsule (500 mg total) by mouth 3 (three) times daily.   30 capsule   0   . bacitracin ointment   Topical   Apply 1 application topically 2 (two) times daily.   120 g   0   . diclofenac (CATAFLAM) 50 MG tablet   Oral   Take 1 tablet (50 mg total) by mouth 3 (three) times daily. For tooth pain   30 tablet   0   . fexofenadine (ALLEGRA) 180 MG tablet   Oral   Take 1 tablet (180 mg total) by mouth daily.   30 tablet   1   . fluticasone (FLONASE) 50 MCG/ACT nasal spray   Each Nare   Place 1 spray into both nostrils 2 (two) times daily.   1 g   2   . ibuprofen (ADVIL,MOTRIN) 200 MG tablet   Oral   Take 800 mg by mouth every 6 (six) hours as needed for moderate pain.          . medroxyPROGESTERone (DEPO-PROVERA) 150 MG/ML injection   Intramuscular   Inject 150 mg into the muscle every 3 (three) months.         .Marland Kitchen  oxymetazoline (AFRIN) 0.05 % nasal spray   Each Nare   Place 1 spray into both nostrils 2 (two) times daily as needed for congestion.         . SUMAtriptan (IMITREX) 25 MG tablet   Oral   Take 2 tablets (50 mg total) by mouth every 2 (two) hours as needed for migraine (ongoing headache). Maximum daily dose 200mg    30 tablet   0   . traMADol (ULTRAM) 50 MG tablet   Oral   Take 1 tablet (50 mg total) by mouth every 6 (six) hours as needed.   15 tablet   0    BP 126/71  Pulse 84  Temp(Src) 98.6 F (37 C) (Oral)  Resp 18  Ht 5\' 6"  (1.676 m)  Wt 240 lb (108.863 kg)  BMI 38.76 kg/m2  SpO2 100%  Physical Exam  Nursing note and vitals reviewed. Constitutional: She appears well-developed and well-nourished. No distress.  HENT:  Head: Normocephalic and atraumatic.  Neck: Neck supple.  Pulmonary/Chest: Effort normal.  Musculoskeletal:  Amy Mcfarland test positive. Capillary refill less than 2 seconds. Sensation intact. Full active ROM and strength 5/5. Tenderness over thenar eminence and  diffusely over the base of the thumb. Radial pulses intact. No bony tenderness of the wrist. No erythema, edema or warmth.   Neurological: She is alert.  Skin: She is not diaphoretic.    ED Course  Procedures (including critical care time)  DIAGNOSTIC STUDIES: Oxygen Saturation is 100% on RA, normal by my interpretation.    COORDINATION OF CARE: 2:21 PM-Discussed treatment plan which includes Velcro splint, an anti-inflammatory and ice with pt at bedside and pt agreed to plan. Will give pt orthopedic referral and advised her to follow up if symptoms do not resolve.   Labs Review Labs Reviewed - No data to display Imaging Review Dg Finger Thumb Right  02/25/2014   CLINICAL DATA:  Thumb injury and pain.  EXAM: RIGHT THUMB 2+V  COMPARISON:  10/05/2010  FINDINGS: There is no evidence of fracture or dislocation. There is no evidence of arthropathy or other focal bone abnormality. Soft tissues are unremarkable  IMPRESSION: Negative.   Electronically Signed   By: Myles RosenthalJohn  Stahl M.D.   On: 02/25/2014 13:45     EKG Interpretation None      MDM   Final diagnoses:  Tendonitis    Pt with tendonitis vs tenosynovitis of the thumb.  Placed in velcro splint, RICE treatment, NSAIDs, hand follow up.  Discussed result, findings, treatment, and follow up  with patient.  Pt given return precautions.  Pt verbalizes understanding and agrees with plan.      I personally performed the services described in this documentation, which was scribed in my presence. The recorded information has been reviewed and is accurate.  AustellEmily Vennesa Bastedo, PA-C 02/25/14 [phone]

## 2014-02-25 NOTE — ED Notes (Signed)
Per pt sts 2 days of right thumb pain. denies injury. sts hurts more when she moves her thumb.

## 2014-02-25 NOTE — Progress Notes (Signed)
Orthopedic Tech Progress Note Patient Details:  Amy Mcfarland 14-Mar-1986 [id number] [employer] Type of Ortho Device: Thumb velcro splint Ortho Device/Splint Location: RLE Ortho Device/Splint Interventions: Application   [name] 02/25/2014, 2:55 PM

## 2014-02-25 NOTE — Discharge Instructions (Signed)
Read the information below.  Use the prescribed medication as directed.  Please discuss all new medications with your pharmacist.  You may return to the Emergency Department at any time for worsening condition or any new symptoms that concern you.  If there is any possibility that you might be pregnant, please let your health care provider know and discuss this with the pharmacist to ensure medication safety. If you develop fevers, uncontrolled pain, weakness or numbness of the extremity, severe discoloration of the skin, or you are unable to use your hand/thumb, return to the ER for a recheck.      Tendinitis Tendinitis is swelling and inflammation of the tendons. Tendons are band-like tissues that connect muscle to bone. Tendinitis commonly occurs in the:   Shoulders (rotator cuff).  Heels (Achilles tendon).  Elbows (triceps tendon). CAUSES Tendinitis is usually caused by overusing the tendon, muscles, and joints involved. When the tissue surrounding a tendon (synovium) becomes inflamed, it is called tenosynovitis. Tendinitis commonly develops in people whose jobs require repetitive motions. SYMPTOMS  Pain.  Tenderness.  Mild swelling. DIAGNOSIS Tendinitis is usually diagnosed by physical exam. Your caregiver may also order X-rays or other imaging tests. TREATMENT Your caregiver may recommend certain medicines or exercises for your treatment. HOME CARE INSTRUCTIONS   Use a sling or splint for as long as directed by your caregiver until the pain decreases.  Put ice on the injured area.  Put ice in a plastic bag.  Place a towel between your skin and the bag.  Leave the ice on for 15-20 minutes, 03-04 times a day.  Avoid using the limb while the tendon is painful. Perform gentle range of motion exercises only as directed by your caregiver. Stop exercises if pain or discomfort increase, unless directed otherwise by your caregiver.  Only take over-the-counter or prescription  medicines for pain, discomfort, or fever as directed by your caregiver. SEEK MEDICAL CARE IF:   Your pain and swelling increase.  You develop new, unexplained symptoms, especially increased numbness in the hands. MAKE SURE YOU:   Understand these instructions.  Will watch your condition.  Will get help right away if you are not doing well or get worse. Document Released: 10/29/2000 Document Revised: 01/24/2012 Document Reviewed: 01/18/2011 [facility] FloridaExitCare Patient Information 2014 [place]ExitCare, MarylandLL[place].  Tendinitis and Tenosynovitis  Tendinitis is inflammation of the tendon. Tenosynovitis is inflammation of the lining around the tendon (tendon sheath). These painful conditions often occur at once. Tendons attach muscle to bone. To move a limb, force from the muscle moves through the tendon, to the bone. These conditions often cause increased pain when moving. Tendinitis may be caused by a small or partial tear in the tendon.  SYMPTOMS   Pain, tenderness, redness, bruising, or swelling at the injury.  Loss of normal joint movement.  Pain that gets worse with use of the muscle and joint attached to the tendon.  Weakness in the tendon, caused by calcium build up that may occur with tendinitis.  Commonly affected tendons:  Achilles tendon (calf of leg).  Rotator cuff (shoulder joint).  Patellar tendon (kneecap to shin).  Peroneal tendon (ankle).  Posterior tibial tendon (inner ankle).  Biceps tendon (in front of shoulder). CAUSES   Sudden strain on a flexed muscle, muscle overuse, sudden increase or change in activity, vigorous activity.  Result of a direct hit (less common).  Poor muscle action (biomechanics). RISK INCREASES WITH:  Injury (trauma).  Too much exercise.  Sudden change in athletic activity.  Incorrect exercise form or technique.  Poor strength and flexibility.  Not warming-up properly before activity.  Returning to activity before healing is  complete. PREVENTION   Warm-up and stretch properly before activity.  Maintain physical fitness:  Joint flexibility.  Muscle strength and endurance.  Fitness that increases heart rate.  Learn and use proper exercise techniques.  Use rehabilitation exercises to strengthen weak muscles and tendons.  Ice the tendon after activity, to reduce recurring inflammation.  Wear proper fitting protective equipment for specific tendons, when indicated. PROGNOSIS  When treated properly, can be cured in 6 to 8 weeks. Recovery may take longer, depending on degree of injury.  RELATED COMPLICATIONS   Re-injury or recurring symptoms.  Permanent weakness or joint stiffness, if injury is severe and recovery is not completed.  Delayed healing, if sports are started before healing is complete.  Tearing apart (rupture) of the inflamed tendon. Tendinitis means the tendon is injured and must recover. TREATMENT  Treatment first involves ice, medicine, and rest from aggravating activities. This reduces pain and inflammation. Modifying your activity may be considered to prevent recurring injury. A brace, elastic bandage wrap, splint, cast, or sling may be prescribed to protect the joint for a short period. After that period, strengthening and stretching exercise may help to regain strength and full range of motion. If the condition persists, despite non-surgical treatment, surgery may be recommended to remove the inflamed tendon lining. Corticosteroid injections may be given to reduce inflammation. However, these injections may weaken the tendon and increase your risk for tendon rupture. MEDICATION   If pain medicine is needed, nonsteroidal anti-inflammatory medicines (aspirin and ibuprofen), or other minor pain relievers (acetaminophen), are often recommended.  Do not take pain medicine for 7 days before surgery.  Prescription pain relievers are usually prescribed only after surgery. Use only as directed  and only as much as you need.  Ointments applied to the skin may be helpful.  Corticosteroid injections may be given to reduce inflammation. However, this may increase your risk of a tendon rupture. HEAT AND COLD  Cold treatment (icing) relieves pain and reduces inflammation. Cold treatment should be applied for 10 to 15 minutes every 2 to 3 hours, and immediately after activity that aggravates your symptoms. Use ice packs or an ice massage.  Heat treatment may be used before performing stretching and strengthening activities prescribed by your caregiver, physical therapist, or athletic trainer. Use a heat pack or a warm water soak. SEEK MEDICAL CARE IF:   Symptoms get worse or do not improve, despite treatment.  Pain becomes too much to tolerate.  You develop numbness or tingling.  Toes become cold, or toenails become blue, gray, or dark colored.  New, unexplained symptoms develop. (Drugs used in treatment may produce side effects.) Document Released: 11/01/2005 Document Revised: 01/24/2012 Document Reviewed: 02/13/2009 [facility] CenterExitCare Patient Information 2014 [place]ExitCare, MarylandLLC.

## 2014-02-25 NOTE — ED Notes (Signed)
Patient transported to X-ray 

## 2014-02-28 NOTE — ED Provider Notes (Signed)
Medical screening examination/treatment/procedure(s) were performed by non-physician practitioner and as supervising physician I was immediately available for consultation/collaboration.  Gerhard Munchobert Dalicia Kisner, MD 02/28/14 [phone]

## 2014-04-05 ENCOUNTER — Encounter (HOSPITAL_COMMUNITY): Payer: Self-pay | Admitting: Emergency Medicine

## 2014-04-05 ENCOUNTER — Emergency Department (HOSPITAL_COMMUNITY)
Admission: EM | Admit: 2014-04-05 | Discharge: 2014-04-05 | Disposition: A | Discharge status: Home / Self Care | Payer: Medicaid Other | Source: Home / Self Care | Attending: Family Medicine | Admitting: Family Medicine

## 2014-04-05 DIAGNOSIS — K029 Dental caries, unspecified: Secondary | ICD-10-CM

## 2014-04-05 DIAGNOSIS — K0889 Other specified disorders of teeth and supporting structures: Secondary | ICD-10-CM

## 2014-04-05 DIAGNOSIS — K089 Disorder of teeth and supporting structures, unspecified: Secondary | ICD-10-CM

## 2014-04-05 MED ORDER — AMOXICILLIN 875 MG PO TABS: 875.0000 mg | ORAL_TABLET | Freq: Two times a day (BID) | ORAL | Status: DC

## 2014-04-05 MED ORDER — BENZONATATE 200 MG PO CAPS: ORAL_CAPSULE | ORAL | Status: DC

## 2014-04-05 MED ORDER — OXYCODONE-ACETAMINOPHEN 5-325 MG PO TABS: 2.0000 | ORAL_TABLET | Freq: Four times a day (QID) | ORAL | Status: DC | PRN

## 2014-04-05 NOTE — Discharge Instructions (Signed)
Dental Pain °A tooth ache may be caused by cavities (tooth decay). Cavities expose the nerve of the tooth to air and hot or cold temperatures. It may come from an infection or abscess (also called a boil or furuncle) around your tooth. It is also often caused by dental caries (tooth decay). This causes the pain you are having. °DIAGNOSIS  °Your caregiver can diagnose this problem by exam. °TREATMENT  °· If caused by an infection, it may be treated with medications which kill germs (antibiotics) and pain medications as prescribed by your caregiver. Take medications as directed. °· Only take over-the-counter or prescription medicines for pain, discomfort, or fever as directed by your caregiver. °· Whether the tooth ache today is caused by infection or dental disease, you should see your dentist as soon as possible for further care. °SEEK MEDICAL CARE IF: °The exam and treatment you received today has been provided on an emergency basis only. This is not a substitute for complete medical or dental care. If your problem worsens or new problems (symptoms) appear, and you are unable to meet with your dentist, call or return to this location. °SEEK IMMEDIATE MEDICAL CARE IF:  °· You have a fever. °· You develop redness and swelling of your face, jaw, or neck. °· You are unable to open your mouth. °· You have severe pain uncontrolled by pain medicine. °MAKE SURE YOU:  °· Understand these instructions. °· Will watch your condition. °· Will get help right away if you are not doing well or get worse. °Document Released: 11/01/2005 Document Revised: 01/24/2012 Document Reviewed: 06/19/2008 °ExitCare® Patient Information ©2014 [employer]. ° °Dental Caries  °Dental caries (also called tooth decay) is the most common oral disease. It can occur at any age, but is more common in children and young adults.  °HOW DENTAL CARIES DEVELOPS  °The process of decay begins when bacteria and foods (particularly sugars and starches) combine  in your mouth to produce plaque. Plaque is a substance that sticks to the hard, outer surface of a tooth (enamel). The bacteria in plaque produce acids that attack enamel. These acids may also attack the root surface of a tooth (cementum) if it is exposed. Repeated attacks dissolve these surfaces and create holes in the tooth (cavities). If left untreated, the acids destroy the other layers of the tooth.  °RISK FACTORS °· Frequent sipping of sugary beverages.   °· Frequent snacking on sugary and starchy foods, especially those that easily get stuck in the teeth.   °· Poor oral hygiene.   °· Dry mouth.   °· Substance abuse such as methamphetamine abuse.   °· Broken or poor-fitting dental restorations.   °· Eating disorders.   °· Gastroesophageal reflux disease (GERD).   °· Certain radiation treatments to the head and neck. °SYMPTOMS °In the early stages of dental caries, symptoms are seldom present. Sometimes white, chalky areas may be seen on the enamel or other tooth layers. In later stages, symptoms may include: °· Pits and holes on the enamel. °· Toothache after sweet, hot, or cold foods or drinks are consumed. °· Pain around the tooth. °· Swelling around the tooth. °DIAGNOSIS  °Most of the time, dental caries is detected during a regular dental checkup. A diagnosis is made after a thorough medical and dental history is taken and the surfaces of your teeth are checked for signs of dental caries. Sometimes special instruments, such as lasers, are used to check for dental caries. Dental X-ray exams may be taken so that areas not visible to the   eye (such as between the contact areas of the teeth) can be checked for cavities.  °TREATMENT  °If dental caries is in its early stages, it may be reversed with a fluoride treatment or an application of a remineralizing agent at the dental office. Thorough brushing and flossing at home is needed to aid these treatments. If it is in its later stages, treatment depends on the  location and extent of tooth destruction:  °· If a small area of the tooth has been destroyed, the destroyed area will be removed and cavities will be filled with a material such as gold, silver amalgam, or composite resin.   °· If a large area of the tooth has been destroyed, the destroyed area will be removed and a cap (crown) will be fitted over the remaining tooth structure.   °· If the center part of the tooth (pulp) is affected, a procedure called a root canal will be needed before a filling or crown can be placed.   °· If most of the tooth has been destroyed, the tooth may need to be pulled (extracted). °HOME CARE INSTRUCTIONS °You can prevent, stop, or reverse dental caries at home by practicing good oral hygiene. Good oral hygiene includes: °· Thoroughly cleaning your teeth at least twice a day with a toothbrush and dental floss.   °· Using a fluoride toothpaste. A fluoride mouth rinse may also be used if recommended by your dentist or health care provider.   °· Restricting the amount of sugary and starchy foods and sugary liquids you consume.   °· Avoiding frequent snacking on these foods and sipping of these liquids.   °· Keeping regular visits with a dentist for checkups and cleanings. °PREVENTION  °· Practice good oral hygiene. °· Consider a dental sealant. A dental sealant is a coating material that is applied by your dentist to the pits and grooves of teeth. The sealant prevents food from being trapped in them. It may protect the teeth for several years. °· Ask about fluoride supplements if you live in a community without fluorinated water or with water that has a low fluoride content. Use fluoride supplements as directed by your dentist or health care provider. °· Allow fluoride varnish applications to teeth if directed by your dentist or health care provider. °Document Released: 07/24/2002 Document Revised: 07/04/2013 Document Reviewed: 11/03/2012 °ExitCare® Patient Information ©2014 [employer],  LLC. ° °

## 2014-04-05 NOTE — ED Provider Notes (Signed)
CSN: [id number]     Arrival date & time 04/05/14  0919 History   First MD Initiated Contact with Patient 04/05/14 [phone]     Chief Complaint  Patient presents with  . Dental Pain   (Consider location/radiation/quality/duration/timing/severity/associated sxs/prior Treatment) HPI Comments: 28 year old female presents complaining of toothache. She has constant pain for the past week, gradually worsening. This is in the lower left jaw. She has a Education officer, community, she says she was supposed to get the tooth pulled the day before yesterday but her dentist had to cancel the surgery, she would be getting this done next Wednesday now. she denies any fever or trismus. This is the same tooth that it always gives her trouble. She has taken naproxen, some left over amoxicillin, and she 1-2 tablets of Percocet. She is also taking Aleve. She says that of everything, the thing that has helped with the most is when she was given Jerilynn Som and told to squeeze liquid onto her tooth.   Past Medical History  Diagnosis Date  . Migraine   . Miscarriage   . Kidney infection    Past Surgical History  Procedure Laterality Date  . Cesarean section  2011  . Cesarean section    . Cesarean section     Family History  Problem Relation Age of Onset  . Asthma Maternal Uncle   . Cancer Maternal Grandmother     breast cancer  . Cancer Paternal Grandmother   . Diabetes Mother   . Hypertension Father   . Diabetes Father    History  Substance Use Topics  . Smoking status: Current Some Day Smoker -- 0.25 packs/day for 7 years    Types: Cigarettes  . Smokeless tobacco: Never Used  . Alcohol Use: Yes     Comment: occasionally   OB History   Grav Para Term Preterm Abortions TAB SAB Ect Mult Living   2 1 1  1  1   1      Review of Systems  HENT: Positive for dental problem.   All other systems reviewed and are negative.   Allergies  Kiwi extract; Peach; Strawberry; and Vicodin  Home Medications   Prior to  Admission medications   Medication Sig Start Date End Date Taking? Authorizing Provider  amoxicillin (AMOXIL) 500 MG capsule Take 500 mg by mouth daily.    Historical Provider, MD  fexofenadine (ALLEGRA) 180 MG tablet Take 1 tablet (180 mg total) by mouth daily. 02/19/14   Linna Hoff, MD  fluticasone (FLONASE) 50 MCG/ACT nasal spray Place 1 spray into both nostrils 2 (two) times daily. 02/19/14   Linna Hoff, MD  Hyprom-Naphaz-Polysorb-Zn Sulf (CLEAR EYES COMPLETE OP) Apply 1 drop to eye 4 (four) times daily as needed (for allergies).    Historical Provider, MD  meloxicam (MOBIC) 7.5 MG tablet Take 1 tablet (7.5 mg total) by mouth daily. 02/25/14   Trixie Dredge, PA-C  SUMAtriptan (IMITREX) 25 MG tablet Take 2 tablets (50 mg total) by mouth every 2 (two) hours as needed for migraine (ongoing headache). Maximum daily dose 200mg  02/01/14   Tiffany G Greene, PA-C   BP 140/78  Pulse 86  Temp(Src) 98.3 F (36.8 C) (Oral)  Resp 18  SpO2 99% Physical Exam  Nursing note and vitals reviewed. Constitutional: She is oriented to person, place, and time. Vital signs are normal. She appears well-developed and well-nourished. No distress.  HENT:  Head: Normocephalic and atraumatic.  Mouth/Throat: Dental caries present. No dental abscesses.  Pulmonary/Chest: Effort normal. No respiratory distress.  Neurological: She is alert and oriented to person, place, and time. She has normal strength. Coordination normal.  Skin: Skin is warm and dry. No rash noted. She is not diaphoretic.  Psychiatric: She has a normal mood and affect. Judgment normal.    ED Course  Procedures (including critical care time) Labs Review Labs Reviewed - No data to display  Imaging Review No results found.   MDM   1. Toothache   2. Dental caries    Treat with amoxicillin, a few tablets of Percocet, I will also prescribe the Tessalon Perles to use like she did last time as a topical numbing agent. Followup with  dentist.  Meds ordered this encounter  Medications  . amoxicillin (AMOXIL) 875 MG tablet    Sig: Take 1 tablet (875 mg total) by mouth 2 (two) times daily.    Dispense:  20 tablet    Refill:  0    Order Specific Question:  Supervising Provider    Answer:  Linna HoffKINDL, JAMES D [phone][[id number]]  . oxyCODONE-acetaminophen (PERCOCET/ROXICET) 5-325 MG per tablet    Sig: Take 2 tablets by mouth every 6 (six) hours as needed for severe pain.    Dispense:  10 tablet    Refill:  0    Order Specific Question:  Supervising Provider    Answer:  Linna HoffKINDL, JAMES D [phone][[id number]]  . benzonatate (TESSALON) 200 MG capsule    Sig: Puncture capsule and apply 1 drop to the tooth as needed for pain, not more than 3 capsules daily    Dispense:  30 capsule    Refill:  0    Order Specific Question:  Supervising Provider    Answer:  Bradd CanaryKINDL, JAMES D [[id number]]       Graylon GoodZachary H Dollie Bressi, PA-C 04/05/14 1127  Graylon GoodZachary H Rissa Turley, PA-C 04/05/14 1128

## 2014-04-05 NOTE — ED Notes (Signed)
Toothache, left bottom tooth.  History of the same.  Patient reports consultation with local dentist on 5/4.  Was due for surgery this past Wednesday 5/20-reports physician was called away for an emergency and now patient is waiting until this Wednesday 527 to have tooth surgery.

## 2014-04-06 NOTE — ED Provider Notes (Signed)
Medical screening examination/treatment/procedure(s) were performed by resident physician or non-physician practitioner and as supervising physician I was immediately available for consultation/collaboration.   Leata Dominy DOUGLAS MD.   Montrice Gracey D Khara Renaud, MD 04/06/14 1117 

## 2014-04-26 ENCOUNTER — Emergency Department (HOSPITAL_COMMUNITY)
Admission: EM | Admit: 2014-04-26 | Discharge: 2014-04-26 | Disposition: A | Discharge status: Home / Self Care | Payer: Medicaid Other | Attending: Emergency Medicine | Admitting: Emergency Medicine

## 2014-04-26 ENCOUNTER — Emergency Department (HOSPITAL_COMMUNITY): Payer: Medicaid Other

## 2014-04-26 ENCOUNTER — Encounter (HOSPITAL_COMMUNITY): Payer: Self-pay | Admitting: Emergency Medicine

## 2014-04-26 DIAGNOSIS — W19XXXA Unspecified fall, initial encounter: Secondary | ICD-10-CM

## 2014-04-26 DIAGNOSIS — Y9289 Other specified places as the place of occurrence of the external cause: Secondary | ICD-10-CM | POA: Insufficient documentation

## 2014-04-26 DIAGNOSIS — R209 Unspecified disturbances of skin sensation: Secondary | ICD-10-CM | POA: Insufficient documentation

## 2014-04-26 DIAGNOSIS — G43909 Migraine, unspecified, not intractable, without status migrainosus: Secondary | ICD-10-CM | POA: Insufficient documentation

## 2014-04-26 DIAGNOSIS — Y9301 Activity, walking, marching and hiking: Secondary | ICD-10-CM | POA: Insufficient documentation

## 2014-04-26 DIAGNOSIS — IMO0002 Reserved for concepts with insufficient information to code with codable children: Secondary | ICD-10-CM | POA: Insufficient documentation

## 2014-04-26 DIAGNOSIS — F17200 Nicotine dependence, unspecified, uncomplicated: Secondary | ICD-10-CM | POA: Insufficient documentation

## 2014-04-26 DIAGNOSIS — W010XXA Fall on same level from slipping, tripping and stumbling without subsequent striking against object, initial encounter: Secondary | ICD-10-CM | POA: Insufficient documentation

## 2014-04-26 DIAGNOSIS — M549 Dorsalgia, unspecified: Secondary | ICD-10-CM

## 2014-04-26 DIAGNOSIS — Z87448 Personal history of other diseases of urinary system: Secondary | ICD-10-CM | POA: Insufficient documentation

## 2014-04-26 MED ORDER — CYCLOBENZAPRINE HCL 10 MG PO TABS: 10.0000 mg | ORAL_TABLET | Freq: Two times a day (BID) | ORAL | Status: DC | PRN

## 2014-04-26 MED ORDER — IBUPROFEN 400 MG PO TABS
800.0000 mg | ORAL_TABLET | Freq: Once | ORAL | Status: AC
Start: 2014-04-26 — End: 2014-04-26
  Administered 2014-04-26: 800 mg via ORAL
  Filled 2014-04-26: qty 2

## 2014-04-26 MED ORDER — MELOXICAM 15 MG PO TABS: 15.0000 mg | ORAL_TABLET | Freq: Every day | ORAL | Status: DC

## 2014-04-26 NOTE — ED Notes (Signed)
Pt was walking and slipped and fell straight on her back yesterday at pool.  No LOC.  Pt has pain from neck down.  Ambulatory.  Tingling in fingers

## 2014-04-26 NOTE — ED Provider Notes (Signed)
CSN: [id number]     Arrival date & time 04/26/14  1303 History  This chart was scribed for non-physician practitioner Emilia BeckKaitlyn Akosua Constantine, PA-C working with Layla MawKristen N Ward, DO by Leone PayorSonum Patel, ED Scribe. This patient was seen in room TR08C/TR08C and the patient's care was started at 2:26 PM.    Chief Complaint  Patient presents with  . Fall      The history is provided by the patient. No language interpreter was used.    HPI Comments: Amy Mcfarland is a 28 y.o. female who presents to the Emergency Department complaining of a fall that occurred yesterday. Patient states she slipped and fell onto her back at the pool. She denies head injury or LOC. She complains of constant, gradually worsening back pain that is worse with movement. She also reports associated, intermittent tingling sensations to the fingers on bilateral hands.   Past Medical History  Diagnosis Date  . Migraine   . Miscarriage   . Kidney infection    Past Surgical History  Procedure Laterality Date  . Cesarean section  2011  . Cesarean section    . Cesarean section     Family History  Problem Relation Age of Onset  . Asthma Maternal Uncle   . Cancer Maternal Grandmother     breast cancer  . Cancer Paternal Grandmother   . Diabetes Mother   . Hypertension Father   . Diabetes Father    History  Substance Use Topics  . Smoking status: Current Some Day Smoker -- 0.25 packs/day for 7 years    Types: Cigarettes  . Smokeless tobacco: Never Used  . Alcohol Use: Yes     Comment: occasionally   OB History   Grav Para Term Preterm Abortions TAB SAB Ect Mult Living   2 1 1  1  1   1      Review of Systems  Musculoskeletal: Positive for back pain. Negative for arthralgias.  Skin: Negative for wound.  Neurological: Negative for syncope.       +paresthesia  All other systems reviewed and are negative.     Allergies  Kiwi extract; Peach; Strawberry; and Vicodin  Home Medications   Prior to Admission  medications   Medication Sig Start Date End Date Taking? Authorizing Provider  acetaminophen (TYLENOL) 500 MG tablet Take 1,000 mg by mouth every 8 (eight) hours as needed for moderate pain.   Yes Historical Provider, MD  fexofenadine (ALLEGRA) 180 MG tablet Take 180 mg by mouth daily as needed for allergies or rhinitis.   Yes Historical Provider, MD  fluticasone (FLONASE) 50 MCG/ACT nasal spray Place 1 spray into both nostrils 2 (two) times daily as needed for allergies or rhinitis.   Yes Historical Provider, MD  Hyprom-Naphaz-Polysorb-Zn Sulf (CLEAR EYES COMPLETE OP) Apply 1 drop to eye 4 (four) times daily as needed (for allergies).   Yes Historical Provider, MD  ibuprofen (ADVIL,MOTRIN) 200 MG tablet Take 800 mg by mouth 2 (two) times daily as needed for moderate pain.    Yes Historical Provider, MD  SUMAtriptan (IMITREX) 25 MG tablet Take 2 tablets (50 mg total) by mouth every 2 (two) hours as needed for migraine (ongoing headache). Maximum daily dose 200mg  02/01/14  Yes Tiffany G Greene, PA-C   BP 110/66  Pulse 88  Temp(Src) 98.7 F (37.1 C) (Oral)  Resp 22  SpO2 99% Physical Exam  Nursing note and vitals reviewed. Constitutional: She is oriented to person, place, and time. She appears well-developed and well-nourished.  Cervical collar in place.  HENT:  Head: Normocephalic and atraumatic.  Neck: Neck supple. No tracheal deviation present.  Bilateral cervical spine tenderness to palpation. No midline cervical spine tenderness.    Cardiovascular: Normal rate.   Pulmonary/Chest: Effort normal.  Abdominal: She exhibits no distension.  Musculoskeletal:  No midline spine tenderness to palpation.   Neurological: She is alert and oriented to person, place, and time.  Skin: Skin is warm and dry.  Psychiatric: She has a normal mood and affect.    ED Course  Procedures (including critical care time)  DIAGNOSTIC STUDIES: Oxygen Saturation is 99% on RA, normal by my interpretation.     COORDINATION OF CARE: 2:28 PM Discussed treatment plan with pt at bedside and pt agreed to plan.   Labs Review Labs Reviewed - No data to display  Imaging Review Dg Cervical Spine Complete  04/26/2014   CLINICAL DATA:  Fall.  EXAM: CERVICAL SPINE  4+ VIEWS  COMPARISON:  None.  FINDINGS: There is no evidence of cervical spine fracture or prevertebral soft tissue swelling. Alignment is normal. No other significant bone abnormalities are identified.  IMPRESSION: Negative cervical spine radiographs.   Electronically Signed   By: Maisie Fushomas  Register   On: 04/26/2014 14:01   Dg Thoracic Spine 2 View  04/26/2014   CLINICAL DATA:  Larey SeatFell by pool, struck head, posterior cervical and upper thoracic pain  EXAM: THORACIC SPINE - 2 VIEW  COMPARISON:  Chest radiographs 09/24/2013  FINDINGS: Twelve pairs of ribs.  Osseous mineralization grossly normal for technique.  Vertebral body and disc space heights maintained.  No acute fracture, subluxation or bone destruction.  IMPRESSION: No acute osseous abnormalities.   Electronically Signed   By: Ulyses SouthwardMark  Boles M.D.   On: 04/26/2014 14:00     EKG Interpretation None      MDM   Final diagnoses:  Fall  Back pain    Patient's xrays unremarkable for acute changes. Patient has no neuro deficits. No head trauma or LOC. Patient will be discharged with mobic and flexeril. No further evaluation needed at this time.   I personally performed the services described in this documentation, which was scribed in my presence. The recorded information has been reviewed and is accurate.    Emilia BeckKaitlyn Landry Kamath, PA-C 04/26/14 1620

## 2014-04-26 NOTE — Discharge Instructions (Signed)
Take Mobic as needed for pain. Take Flexeril as needed for muscle spasm. You may take these medications together. Follow up with a primary care provider from the list below. Refer to attached documents for more information.    Emergency Department Resource Guide 1) Find a Doctor and Pay Out of Pocket Although you won't have to find out who is covered by your insurance plan, it is a good idea to ask around and get recommendations. You will then need to call the office and see if the doctor you have chosen will accept you as a new patient and what types of options they offer for patients who are self-pay. Some doctors offer discounts or will set up payment plans for their patients who do not have insurance, but you will need to ask so you aren't surprised when you get to your appointment.  2) Contact Your Local Health Department Not all health departments have doctors that can see patients for sick visits, but many do, so it is worth a call to see if yours does. If you don't know where your local health department is, you can check in your phone book. The CDC also has a tool to help you locate your state's health department, and many state websites also have listings of all of their local health departments.  3) Find a Walk-in Clinic If your illness is not likely to be very severe or complicated, you may want to try a walk in clinic. These are popping up all over the country in pharmacies, drugstores, and shopping centers. They're usually staffed by nurse practitioners or physician assistants that have been trained to treat common illnesses and complaints. They're usually fairly quick and inexpensive. However, if you have serious medical issues or chronic medical problems, these are probably not your best option.  No Primary Care Doctor: - Call Health Connect at  [phone] - they can help you locate a primary care doctor that  accepts your insurance, provides certain services, etc. - Physician Referral  Service- [phone]  Chronic Pain Problems: Organization         Address  Phone   Notes  [facility]  [phone] Patients need to be referred by their primary care doctor.   Medication Assistance: Organization         Address  Phone   Notes  [facility] Medication [facility] [address] [place], [place] [address] [phone] --Must be a resident of [place] [facility] [place] -- Must have NO insurance coverage whatsoever (no Medicaid/ Medicare, etc.) -- The pt. MUST have a primary care doctor that directs their care regularly and follows them in the community   MedAssist  [phone]) [phone]   [employer]  [phone]) [phone]    Agencies that provide inexpensive medical care: Organization         Address  Phone   Notes  [facility]  [phone]   [facility]    [phone]   [facility] [address] [place], [place] [address] [phone]   [facility] [address], [place] [phone]) [phone]   [facility]anned [facility]    [phone]   [facility]    [phone]   [facility]  [address], [place] Phone:  [phone], Fax:  [phone] Hours of Operation:  9 am - 6 pm, M-F.  Also accepts Medicaid/Medicare and self-pay.  [facility] for [place] [address], [address], [place] Phone: [phone], Fax: [phone]. Hours of Operation:  8:30 am - 5:30 pm, M-F.  Also accepts Medicaid and self-pay.  [facility] [place] [address] [place] Phone: [phone]   [place], [place], [place] [phone], Ext. [phone] Mondays & Thursdays: 7-9 AM.  First 15 patients are seen on a first come, first serve basis.    [place] Providers:  Organization         Address  Phone   Notes  [facility] [address],  [phone] Also accepts self-pay patients.  [facility] [address], [place]  [phone]   [address] [place], [address], [place] [phone]   [facility] [address] [place] [phone]   Lucianne Lei [address], [place]   [phone] Only accepts [place] Access [place] patients after they have their name applied to their card.   Self-Pay (no insurance) in [facility]:  Organization         Address  Phone   Notes  Sickle Cell Patients, [facility] [place] [phone]   [facility] [place] [phone]   [facility] [place]  [place], [place], [place] [phone]   [facility]/Dr. Osei-Bonsu  [address], [place] or [address], [place] [phone] Phone number for both [place] and [place] locations is the same.  Urgent Medical and [facility] [address] [place] [phone]   [facility] [address], [place] or [address] [phone]   [facility] [address], [place] [phone], phone; [phone], fax Sees patients 1st and 3rd Saturday of every month.  Must not qualify for public or private insurance (i.e. Medicaid, Medicare, [place] Health Choice, Veterans' Benefits)  Household income should be no more than 200% of the poverty level The clinic cannot treat you if you are pregnant or think you are pregnant  Sexually transmitted diseases are not treated at the clinic.    Dental Care: Organization         Address  Phone  Notes  [facility] [place] [phone] Accepts children up to age 97 who are enrolled in  [place] or [place]; pregnant women with a Medicaid card; and children who have applied for Medicaid or [place] Health Choice, but were declined, whose parents can pay a reduced fee at time of service.  [facility]  [address], [place] [phone] Accepts children up to age 19 who are enrolled in [place] or [place]; pregnant women with a Medicaid card; and children who have applied for Medicaid or [place] Health Choice, but were declined, whose parents can pay a reduced fee at time of service.  [facility] Access PROGRAM  [place] [phone] Patients are seen by appointment only. Walk-ins are not accepted. [place] will see patients 69 years of age and older. Monday - Tuesday (8am-5pm) Most Wednesdays (8:30-5pm) $30 per  visit, cash only  [facility] Adult [employer] PROGRAM  [address], [address] [facility] [phone] Patients are seen by appointment only. Walk-ins are not accepted. [place] will see patients 48 years of age and older. One Wednesday Evening (Monthly: Volunteer Based).  $30 per visit, cash only  [place]  [phone] for adults; Children under age 62, call Graduate Pediatric Dentistry at [phone]. Children aged 41-14, please call [phone] to request a pediatric application.  Dental services are provided in all areas of dental care including fillings, crowns and bridges, complete and partial dentures, implants, gum treatment, root canals, and extractions. Preventive care is also provided. Treatment is provided to both adults and children. Patients are selected via a lottery and there is often a waiting list.   [facility] [address] [place]  [phone] [email or web address]   [facility] [address] [place], [place] [phone], Ext. [phone] Second and Fourth Thursday of each month, opens at 6:30  AM; Clinic ends at 9 AM.  Patients are seen on a first-come first-served basis, and a limited number are seen during each clinic.   [facility]  [address] [place], [place] [phone]   Eligibility Requirements You must have lived in [place], [place], or [place] counties for at least the last three months.   You cannot be eligible for state or federal sponsored [employer], including [employer], [place], or [employer].   You generally cannot be eligible for healthcare insurance through your employer.    How to apply: Eligibility screenings are held every Tuesday and Wednesday afternoon from 1:00 pm until 4:00 pm. You do not need an appointment for the interview!  [facility] [address], [place], [place]   [place]  [place] Department  [place]  [phone]    Behavioral Health Resources in the Community: Intensive Outpatient Programs Organization         Address  Phone  Notes  [facility] [place]. [address] [place], [place] [phone]   [facility] Outpatient [address] [place], [place]   ADS: Alcohol & Drug Svcs [address] [place], [place]   [place] [address]  [place], [place] or [phone]   Substance Abuse Resources Organization         Address  Phone  Notes  Alcohol and Drug Services  [phone]   [place]  [phone]   [facility] [place]   Chinita Pester  [phone]   Residential & Outpatient Substance Abuse Program  [phone]   Psychological Services Organization         Address  Phone  Notes  [facility] [place]  [place]  [phone]   [place] [address] [place] or  [phone]    Mobile Crisis Teams Organization         Address  Phone  Notes  [facility], Mobile Crisis Care Unit  [phone]   [facility]  [address] [place], [place]   [place] Levels [address], [place] [phone]    Self-Help/Support Groups Organization         Address  Phone  Notes  [facility] of [place] - variety of support groups  [place] Call for more information  Narcotics Anonymous (NA), [facility] [address], [employer] [place]  2 meetings at this location   Special educational needs teacher         Address  Phone  Notes  ASAP Residential Treatment [place],    [place]  [phone]   [facility]  [address] [place] [id number], [place], [place]   [place], [place] [phone] Admissions: 8am-3pm M-F  Incentives Substance [place] [address]    [place], [place] [phone]   [facility] [address] [place], [place], [place]   [facility] [address],  [place], [place]   [facility] - Intensive Outpatient [address], [place], [place]   [facility] ([place] [address].,  [place], [place] [phone] or [phone]   [facility]idential Treatment Services (RTS) [address] [place], [place] Accepts Medicaid  [facility] [place] [address],  [place] [phone] Substance Abuse/Addiction Treatment   [facility] Organization         Address  Phone  Notes  [facility]  [phone]   Domenic Schwab, PhD [address] Arlis Port[place], [place]   [phone] or [phone]   [place] and [place], [place] [phone]   [facility] [address],  [place], [place] [phone] Insurance/Medicaid/sponsorship through [facility] and Families [address], [address] [place]                                    [place], [place] [phone] [place] [address] [place], [place] [phone]    Dr. Adele Schilder  [phone]   [facility] Dept. 1) [address], [place] 2) [place] 3)  [place] [address], [place] [phone]  [phone]   [place] [phone] or [phone] (After Hours)

## 2014-04-29 NOTE — ED Provider Notes (Signed)
Medical screening examination/treatment/procedure(s) were performed by non-physician practitioner and as supervising physician I was immediately available for consultation/collaboration.   EKG Interpretation None        Layla MawKristen N Ward, DO 04/29/14 1513

## 2014-09-16 ENCOUNTER — Encounter (HOSPITAL_COMMUNITY): Payer: Self-pay | Admitting: Emergency Medicine

## 2014-10-18 ENCOUNTER — Ambulatory Visit: Payer: Medicaid Other | Admitting: Obstetrics & Gynecology

## 2014-10-18 ENCOUNTER — Telehealth: Payer: Self-pay | Admitting: General Practice

## 2014-10-18 ENCOUNTER — Encounter: Payer: Self-pay | Admitting: General Practice

## 2014-10-18 NOTE — Telephone Encounter (Signed)
Patient no showed for appt today. Called patient at all listed numbers and each one was invalid. Will send letter

## 2014-10-25 ENCOUNTER — Ambulatory Visit: Payer: Medicaid Other | Admitting: Advanced Practice Midwife

## 2014-11-20 ENCOUNTER — Encounter: Payer: Self-pay | Admitting: Obstetrics & Gynecology

## 2014-11-20 ENCOUNTER — Ambulatory Visit (INDEPENDENT_AMBULATORY_CARE_PROVIDER_SITE_OTHER): Payer: Medicaid Other | Admitting: Obstetrics & Gynecology

## 2014-11-20 VITALS — BP 126/80 | HR 80 | Temp 98.2°F | Ht 66.0 in | Wt 280.7 lb

## 2014-11-20 DIAGNOSIS — Z01812 Encounter for preprocedural laboratory examination: Secondary | ICD-10-CM

## 2014-11-20 DIAGNOSIS — Z3042 Encounter for surveillance of injectable contraceptive: Secondary | ICD-10-CM

## 2014-11-20 LAB — POCT PREGNANCY, URINE: PREG TEST UR: NEGATIVE

## 2014-11-20 MED ORDER — MEDROXYPROGESTERONE ACETATE 104 MG/0.65ML ~~LOC~~ SUSP
104.0000 mg | Freq: Once | SUBCUTANEOUS | Status: AC
  Administered 2014-11-20: 104 mg via SUBCUTANEOUS

## 2014-11-20 NOTE — Addendum Note (Signed)
Addended by: Sherre LainASH, Joven Mom A on: 11/20/2014 01:04 PM   Modules accepted: Orders

## 2014-11-20 NOTE — Progress Notes (Signed)
Patient would like to start depo provera. Denies having any sexual activity for the last 4 months, during which time she broke up with her boyfriend. UPT obtained.

## 2014-11-20 NOTE — Progress Notes (Signed)
   Subjective:    Patient ID: David StallJasmine L Middendorf, female    DOB: 12-17-1985, 29 y.o.   MRN: [id number]  HPI  29 yo S AA P1 (29 yo son) is here because she would like to restart depo provera. She has been using depo off and on for about 11 years and likes the amenorrhea associated with it. She has been off depo for about a year and started having periods again. This has prompted her to restart the depo provera. She declines LARC. She is aware of weight gain associated with depo.  Review of Systems She agrees to get a pap smear at her next visit. She declines a flu vaccine today.    Objective:   Physical Exam abd- morbidly obese       Assessment & Plan:  Contraception- depo provera today RTC 12 weeks for next depo and annual exam

## 2014-12-09 ENCOUNTER — Emergency Department (HOSPITAL_COMMUNITY)
Admission: EM | Admit: 2014-12-09 | Discharge: 2014-12-09 | Disposition: A | Discharge status: Home / Self Care | Payer: Medicaid Other | Attending: Emergency Medicine | Admitting: Emergency Medicine

## 2014-12-09 ENCOUNTER — Encounter (HOSPITAL_COMMUNITY): Payer: Self-pay | Admitting: *Deleted

## 2014-12-09 DIAGNOSIS — Z79899 Other long term (current) drug therapy: Secondary | ICD-10-CM | POA: Diagnosis not present

## 2014-12-09 DIAGNOSIS — Z7952 Long term (current) use of systemic steroids: Secondary | ICD-10-CM | POA: Insufficient documentation

## 2014-12-09 DIAGNOSIS — Z87440 Personal history of urinary (tract) infections: Secondary | ICD-10-CM | POA: Insufficient documentation

## 2014-12-09 DIAGNOSIS — R197 Diarrhea, unspecified: Secondary | ICD-10-CM | POA: Insufficient documentation

## 2014-12-09 DIAGNOSIS — R05 Cough: Secondary | ICD-10-CM | POA: Insufficient documentation

## 2014-12-09 DIAGNOSIS — R1110 Vomiting, unspecified: Secondary | ICD-10-CM | POA: Insufficient documentation

## 2014-12-09 DIAGNOSIS — Z3202 Encounter for pregnancy test, result negative: Secondary | ICD-10-CM | POA: Diagnosis not present

## 2014-12-09 DIAGNOSIS — R52 Pain, unspecified: Secondary | ICD-10-CM | POA: Diagnosis present

## 2014-12-09 DIAGNOSIS — R6889 Other general symptoms and signs: Secondary | ICD-10-CM

## 2014-12-09 DIAGNOSIS — Z720 Tobacco use: Secondary | ICD-10-CM | POA: Diagnosis not present

## 2014-12-09 DIAGNOSIS — G43909 Migraine, unspecified, not intractable, without status migrainosus: Secondary | ICD-10-CM | POA: Diagnosis not present

## 2014-12-09 LAB — POC URINE PREG, ED: Preg Test, Ur: NEGATIVE

## 2014-12-09 MED ORDER — IBUPROFEN 200 MG PO TABS
600.0000 mg | ORAL_TABLET | Freq: Once | ORAL | Status: AC
  Administered 2014-12-09: 600 mg via ORAL
  Filled 2014-12-09: qty 3

## 2014-12-09 MED ORDER — SUMATRIPTAN SUCCINATE 25 MG PO TABS
25.0000 mg | ORAL_TABLET | ORAL | Status: DC | PRN
Start: 2014-12-09 — End: 2015-02-21

## 2014-12-09 NOTE — ED Notes (Signed)
Pt reports that she started feeling bad yesterday. Having cough with green sputum, generalized bodyaches, sore throat and diarrhea x 2.

## 2014-12-09 NOTE — Discharge Instructions (Signed)
If you were given medicines take as directed.  If you are on coumadin or contraceptives realize their levels and effectiveness is altered by many different medicines.  If you have any reaction (rash, tongues swelling, other) to the medicines stop taking and see a physician.   Please follow up as directed and return to the ER or see a physician for new or worsening symptoms.  Thank you. Filed Vitals:   12/09/14 0739 12/09/14 0742  BP: 115/72   Pulse: 90   Temp: 97.9 F (36.6 C)   TempSrc: Oral   Resp: 20   Height:  5\' 6"  (1.676 m)  Weight:  280 lb (127.007 kg)  SpO2: 100%

## 2014-12-09 NOTE — ED Provider Notes (Signed)
CSN: [id number]     Arrival date & time 12/09/14  0732 History   First MD Initiated Contact with Patient 12/09/14 [phone]     Chief Complaint  Patient presents with  . Generalized Body Aches  . Cough  . Diarrhea     (Consider location/radiation/quality/duration/timing/severity/associated sxs/prior Treatment) HPI Comments: 29 year old female with history of pregnancy presents with bodyaches, cough, diarrhea, single to vomiting, mild generalized headache for the past 2 days. Contacts are similar. Patient denied to the flu shot this year. No urinary symptoms or immunosuppression history. No recent travel. Symptoms fairly constant.  Patient is a 29 y.o. female presenting with cough and diarrhea. The history is provided by the patient.  Cough Associated symptoms: headaches   Associated symptoms: no chest pain, no chills, no fever, no rash and no shortness of breath   Diarrhea Associated symptoms: arthralgias, headaches and vomiting   Associated symptoms: no abdominal pain, no chills and no fever     Past Medical History  Diagnosis Date  . Migraine   . Miscarriage   . Kidney infection    Past Surgical History  Procedure Laterality Date  . Cesarean section  2011  . Cesarean section    . Cesarean section     Family History  Problem Relation Age of Onset  . Asthma Maternal Uncle   . Cancer Maternal Grandmother     breast cancer  . Cancer Paternal Grandmother   . Diabetes Mother   . Hypertension Father   . Diabetes Father    History  Substance Use Topics  . Smoking status: Current Some Day Smoker -- 0.25 packs/day for 7 years    Types: Cigarettes  . Smokeless tobacco: Never Used  . Alcohol Use: Yes     Comment: occasionally   OB History    Gravida Para Term Preterm AB TAB SAB Ectopic Multiple Living   2 1 1  1  1   1      Review of Systems  Constitutional: Positive for appetite change. Negative for fever and chills.  HENT: Positive for congestion.   Eyes: Negative for  visual disturbance.  Respiratory: Positive for cough. Negative for shortness of breath.   Cardiovascular: Negative for chest pain.  Gastrointestinal: Positive for vomiting and diarrhea. Negative for abdominal pain.  Genitourinary: Negative for dysuria and flank pain.  Musculoskeletal: Positive for arthralgias. Negative for back pain, neck pain and neck stiffness.  Skin: Negative for rash.  Neurological: Positive for headaches. Negative for light-headedness.      Allergies  Kiwi extract; Peach; Strawberry; and Vicodin  Home Medications   Prior to Admission medications   Medication Sig Start Date End Date Taking? Authorizing Provider  acetaminophen (TYLENOL) 500 MG tablet Take 1,000 mg by mouth every 8 (eight) hours as needed for moderate pain.    Historical Provider, MD  cyclobenzaprine (FLEXERIL) 10 MG tablet Take 1 tablet (10 mg total) by mouth 2 (two) times daily as needed for muscle spasms. Patient not taking: Reported on 11/20/2014 04/26/14   Emilia BeckKaitlyn Szekalski, PA-C  fexofenadine (ALLEGRA) 180 MG tablet Take 180 mg by mouth daily as needed for allergies or rhinitis.    Historical Provider, MD  fluticasone (FLONASE) 50 MCG/ACT nasal spray Place 1 spray into both nostrils 2 (two) times daily as needed for allergies or rhinitis.    Historical Provider, MD  Hyprom-Naphaz-Polysorb-Zn Sulf (CLEAR EYES COMPLETE OP) Apply 1 drop to eye 4 (four) times daily as needed (for allergies).    Historical Provider, MD  ibuprofen (ADVIL,MOTRIN) 200 MG tablet Take 800 mg by mouth 2 (two) times daily as needed for moderate pain.     Historical Provider, MD  meloxicam (MOBIC) 15 MG tablet Take 1 tablet (15 mg total) by mouth daily. Patient not taking: Reported on 11/20/2014 04/26/14   [name], PA-C  SUMAtriptan (IMITREX) 25 MG tablet Take 2 tablets (50 mg total) by mouth every 2 (two) hours as needed for migraine (ongoing headache). Maximum daily dose  02/01/14   Tiffany G Greene, PA-C   BP  115/72 mmHg  Pulse 90  Temp(Src) 97.9 F (36.6 C) (Oral)  Resp 20  Ht  (1.676 m)  Wt 280 lb (127.007 kg)  BMI 45.21 kg/m2  SpO2 100%  LMP 10/29/2014 Physical Exam  Constitutional: She is oriented to person, place, and time. She appears well-developed and well-nourished.  HENT:  Head: Normocephalic and atraumatic.  Eyes: Conjunctivae are normal. Right eye exhibits no discharge. Left eye exhibits no discharge.  Neck: Normal range of motion. Neck supple. No tracheal deviation present.  Cardiovascular: Normal rate and regular rhythm.   Pulmonary/Chest: Effort normal and breath sounds normal.  Abdominal: Soft. She exhibits no distension. There is no tenderness. There is no guarding.  Musculoskeletal: She exhibits no edema.  Lymphadenopathy:    Cervical adenopathy: no meningismus.  Neurological: She is alert and oriented to person, place, and time.  Skin: Skin is warm. No rash noted.  Psychiatric: She has a normal mood and affect.  Nursing note and vitals reviewed.   ED Course  Procedures (including critical care time) Labs Review Labs Reviewed  POC URINE PREG, ED    Imaging Review No results found.   EKG Interpretation None      MDM   Final diagnoses:  Flu-like symptoms   Well-appearing healthy patient with flulike illness. No focal findings on exam. Urine pranks negative ibuprofen and supportive care discussed.  Results and differential diagnosis were discussed with the patient/parent/guardian. Close follow up outpatient was discussed, comfortable with the plan.   Medications  ibuprofen (ADVIL,MOTRIN) tablet 600 mg (600 mg Oral Given 12/09/14 0801)    Filed Vitals:   12/09/14 0739 12/09/14 0742  BP: 115/72   Pulse: 90   Temp: 97.9 F (36.6 C)   TempSrc: Oral   Resp: 20   Height:   (1.676 m)  Weight:  280 lb (127.007 kg)  SpO2: 100%     Final diagnoses:  Flu-like symptoms        Enid Skeens, MD 12/09/14 [phone]

## 2014-12-27 ENCOUNTER — Telehealth: Payer: Self-pay | Admitting: General Practice

## 2014-12-27 NOTE — Telephone Encounter (Signed)
Patient called and left message stating she got depo 1/6 and has been bleeding since last Tuesday and cramping, not sure what to do. Called patient back and discussed that when reinitiating birth control it is common to have irregular bleeding and cramping and that can happen with any birth control not just depo. Patient verbalized understanding and states that it's not even a period. It is spotting when she wipes and then it will stop for a day then it will come back again. Told patient that is what can happen and that after the 2nd or 3rd dose after her body is adjusted the irregular bleeding and cramps does improve. Patient verbalized understanding and had no other questions

## 2015-01-17 ENCOUNTER — Emergency Department (INDEPENDENT_AMBULATORY_CARE_PROVIDER_SITE_OTHER)
Admission: EM | Admit: 2015-01-17 | Discharge: 2015-01-17 | Disposition: A | Discharge status: Home / Self Care | Payer: Medicaid Other | Source: Home / Self Care | Attending: Family Medicine | Admitting: Family Medicine

## 2015-01-17 ENCOUNTER — Encounter (HOSPITAL_COMMUNITY): Payer: Self-pay | Admitting: *Deleted

## 2015-01-17 DIAGNOSIS — K0889 Other specified disorders of teeth and supporting structures: Secondary | ICD-10-CM

## 2015-01-17 DIAGNOSIS — K088 Other specified disorders of teeth and supporting structures: Secondary | ICD-10-CM

## 2015-01-17 DIAGNOSIS — K029 Dental caries, unspecified: Secondary | ICD-10-CM

## 2015-01-17 HISTORY — DX: Migraine, unspecified, not intractable, without status migrainosus: G43.909

## 2015-01-17 MED ORDER — OXYCODONE-ACETAMINOPHEN 5-325 MG PO TABS: 1.0000 | ORAL_TABLET | Freq: Four times a day (QID) | ORAL | Status: DC | PRN

## 2015-01-17 MED ORDER — AMOXICILLIN 875 MG PO TABS: 875.0000 mg | ORAL_TABLET | Freq: Two times a day (BID) | ORAL | Status: DC

## 2015-01-17 MED ORDER — CHLORHEXIDINE GLUCONATE 0.12 % MT SOLN: OROMUCOSAL | Status: DC

## 2015-01-17 MED ORDER — IBUPROFEN 800 MG PO TABS: 800.0000 mg | ORAL_TABLET | Freq: Three times a day (TID) | ORAL | Status: DC

## 2015-01-17 NOTE — ED Provider Notes (Signed)
CSN: [id number]     Arrival date & time 01/17/15  [id number] History   First MD Initiated Contact with Patient 01/17/15 [phone]     Chief Complaint  Patient presents with  . Dental Pain   (Consider location/radiation/quality/duration/timing/severity/associated sxs/prior Treatment) HPI        29 year old female presents complaining of dental pain. She has had pain in her left upper jaw for approximately one week. She has a history of having a root canal and a filling in this tooth but the filling has fallen out. She has called her dentist but she says they referred her to oral surgery and the oral surgeon cannot see her for another month. She denies any fever, chills or other systemic symptoms. Denies trismus or sublingual swelling   Past Medical History  Diagnosis Date  . Migraine   . Miscarriage   . Kidney infection   . Migraines   . Kidney infection    Past Surgical History  Procedure Laterality Date  . Cesarean section  2011  . Cesarean section    . Cesarean section     Family History  Problem Relation Age of Onset  . Asthma Maternal Uncle   . Cancer Maternal Grandmother     breast cancer  . Cancer Paternal Grandmother   . Diabetes Mother   . Hypertension Father   . Diabetes Father    History  Substance Use Topics  . Smoking status: Current Some Day Smoker -- 0.25 packs/day for 7 years    Types: Cigarettes  . Smokeless tobacco: Never Used  . Alcohol Use: Yes     Comment: occasionally   OB History    Gravida Para Term Preterm AB TAB SAB Ectopic Multiple Living   2 1 1  1  1   1       Obstetric Comments   LMP 12/25/14     Review of Systems  Constitutional: Negative for fever.  HENT: Positive for dental problem. Negative for facial swelling.   All other systems reviewed and are negative.   Allergies  Kiwi extract; Peach; Strawberry; and Vicodin  Home Medications   Prior to Admission medications   Medication Sig Start Date End Date Taking? Authorizing Provider   acetaminophen (TYLENOL) 500 MG tablet Take 1,000 mg by mouth every 8 (eight) hours as needed for moderate pain.    Historical Provider, MD  amoxicillin (AMOXIL) 875 MG tablet Take 1 tablet (875 mg total) by mouth 2 (two) times daily. 01/17/15   Graylon GoodZachary H Bernice Mullin, PA-C  chlorhexidine (PERIDEX) 0.12 % solution 15 mL swish, gargle, and spit out twice daily 01/17/15   Graylon GoodZachary H Lashay Osborne, PA-C  cyclobenzaprine (FLEXERIL) 10 MG tablet Take 1 tablet (10 mg total) by mouth 2 (two) times daily as needed for muscle spasms. Patient not taking: Reported on 11/20/2014 04/26/14   Emilia BeckKaitlyn Szekalski, PA-C  fexofenadine (ALLEGRA) 180 MG tablet Take 180 mg by mouth daily as needed for allergies or rhinitis.    Historical Provider, MD  fluticasone (FLONASE) 50 MCG/ACT nasal spray Place 1 spray into both nostrils 2 (two) times daily as needed for allergies or rhinitis.    Historical Provider, MD  Hyprom-Naphaz-Polysorb-Zn Sulf (CLEAR EYES COMPLETE OP) Apply 1 drop to eye 4 (four) times daily as needed (for allergies).    Historical Provider, MD  ibuprofen (ADVIL,MOTRIN) 200 MG tablet Take 800 mg by mouth 2 (two) times daily as needed for moderate pain.     Historical Provider, MD  ibuprofen (ADVIL,MOTRIN) 800 MG  tablet Take 1 tablet (800 mg total) by mouth 3 (three) times daily. 01/17/15   Graylon Good, PA-C  meloxicam (MOBIC) 15 MG tablet Take 1 tablet (15 mg total) by mouth daily. Patient not taking: Reported on 11/20/2014 04/26/14   [name], PA-C  oxyCODONE-acetaminophen (PERCOCET/ROXICET) 5-325 MG per tablet Take 1 tablet by mouth every 6 (six) hours as needed for severe pain. 01/17/15   Graylon Good, PA-C  SUMAtriptan (IMITREX) 25 MG tablet Take 2 tablets (50 mg total) by mouth every 2 (two) hours as needed for migraine (ongoing headache). Maximum daily dose  02/01/14   Dorthula Matas, PA-C  SUMAtriptan (IMITREX) 25 MG tablet Take 1 tablet (25 mg total) by mouth every 2 (two) hours as needed for migraine or  headache. May repeat in 2 hours if headache persists or recurs. 12/09/14   Enid Skeens, MD   BP 113/73 mmHg  Pulse 67  Temp(Src) 98.3 F (36.8 C) (Oral)  Resp 14  SpO2 99%  LMP 12/25/2014 Physical Exam  Constitutional: She is oriented to person, place, and time. Vital signs are normal. She appears well-developed and well-nourished. No distress.  HENT:  Head: Normocephalic and atraumatic.  Right Ear: External ear normal.  Left Ear: External ear normal.  Nose: Nose normal.  Mouth/Throat: Oropharynx is clear and moist. Abnormal dentition. Dental caries (multiple dental caries, the tooth that is causing her pain is extremely tender. There is no swelling or abscess surrounding) present. No dental abscesses. No oropharyngeal exudate.  Neck: Normal range of motion. Neck supple.  Pulmonary/Chest: Effort normal. No respiratory distress.  Neurological: She is alert and oriented to person, place, and time. She has normal strength. Coordination normal.  Skin: Skin is warm and dry. No rash noted. She is not diaphoretic.  Psychiatric: She has a normal mood and affect. Judgment normal.  Nursing note and vitals reviewed.   ED Course  Procedures (including critical care time) Labs Review Labs Reviewed - No data to display  Imaging Review No results found.   MDM   1. Dental caries   2. Toothache    Treat with amoxicillin and Peridex, ibuprofen, and a few tablets of Percocet for her immediate pain. She will keep her follow-up with her oral surgeon as scheduled. Return precautions discussed   Meds ordered this encounter  Medications  . ibuprofen (ADVIL,MOTRIN) 800 MG tablet    Sig: Take 1 tablet (800 mg total) by mouth 3 (three) times daily.    Dispense:  60 tablet    Refill:  2  . oxyCODONE-acetaminophen (PERCOCET/ROXICET) 5-325 MG per tablet    Sig: Take 1 tablet by mouth every 6 (six) hours as needed for severe pain.    Dispense:  10 tablet    Refill:  0  . amoxicillin (AMOXIL)  875 MG tablet    Sig: Take 1 tablet (875 mg total) by mouth 2 (two) times daily.    Dispense:  30 tablet    Refill:  0  . chlorhexidine (PERIDEX) 0.12 % solution    Sig: 15 mL swish, gargle, and spit out twice daily    Dispense:  473 mL    Refill:  0       Graylon Good, PA-C 01/17/15 [phone]

## 2015-01-17 NOTE — Discharge Instructions (Signed)
Dental Pain °A tooth ache may be caused by cavities (tooth decay). Cavities expose the nerve of the tooth to air and hot or cold temperatures. It may come from an infection or abscess (also called a boil or furuncle) around your tooth. It is also often caused by dental caries (tooth decay). This causes the pain you are having. °DIAGNOSIS  °Your caregiver can diagnose this problem by exam. °TREATMENT  °· If caused by an infection, it may be treated with medications which kill germs (antibiotics) and pain medications as prescribed by your caregiver. Take medications as directed. °· Only take over-the-counter or prescription medicines for pain, discomfort, or fever as directed by your caregiver. °· Whether the tooth ache today is caused by infection or dental disease, you should see your dentist as soon as possible for further care. °SEEK MEDICAL CARE IF: °The exam and treatment you received today has been provided on an emergency basis only. This is not a substitute for complete medical or dental care. If your problem worsens or new problems (symptoms) appear, and you are unable to meet with your dentist, call or return to this location. °SEEK IMMEDIATE MEDICAL CARE IF:  °· You have a fever. °· You develop redness and swelling of your face, jaw, or neck. °· You are unable to open your mouth. °· You have severe pain uncontrolled by pain medicine. °MAKE SURE YOU:  °· Understand these instructions. °· Will watch your condition. °· Will get help right away if you are not doing well or get worse. °Document Released: 11/01/2005 Document Revised: 01/24/2012 Document Reviewed: 06/19/2008 °ExitCare® Patient Information ©2015 [employer]. This information is not intended to replace advice given to you by your health care provider. Make sure you discuss any questions you have with your health care provider. ° °Dental Caries °Dental caries (also called tooth decay) is the most common oral disease. It can occur at any age but is  more common in children and young adults.  °HOW DENTAL CARIES DEVELOPS  °The process of decay begins when bacteria and foods (particularly sugars and starches) combine in your mouth to produce plaque. Plaque is a substance that sticks to the hard, outer surface of a tooth (enamel). The bacteria in plaque produce acids that attack enamel. These acids may also attack the root surface of a tooth (cementum) if it is exposed. Repeated attacks dissolve these surfaces and create holes in the tooth (cavities). If left untreated, the acids destroy the other layers of the tooth.  °RISK FACTORS °· Frequent sipping of sugary beverages.   °· Frequent snacking on sugary and starchy foods, especially those that easily get stuck in the teeth.   °· Poor oral hygiene.   °· Dry mouth.   °· Substance abuse such as methamphetamine abuse.   °· Broken or poor-fitting dental restorations.   °· Eating disorders.   °· Gastroesophageal reflux disease (GERD).   °· Certain radiation treatments to the head and neck. °SYMPTOMS °In the early stages of dental caries, symptoms are seldom present. Sometimes white, chalky areas may be seen on the enamel or other tooth layers. In later stages, symptoms may include: °· Pits and holes on the enamel. °· Toothache after sweet, hot, or cold foods or drinks are consumed. °· Pain around the tooth. °· Swelling around the tooth. °DIAGNOSIS  °Most of the time, dental caries is detected during a regular dental checkup. A diagnosis is made after a thorough medical and dental history is taken and the surfaces of your teeth are checked for signs of   dental caries. Sometimes special instruments, such as lasers, are used to check for dental caries. Dental X-ray exams may be taken so that areas not visible to the eye (such as between the contact areas of the teeth) can be checked for cavities.  TREATMENT  If dental caries is in its early stages, it may be reversed with a fluoride treatment or an application of a  remineralizing agent at the dental office. Thorough brushing and flossing at home is needed to aid these treatments. If it is in its later stages, treatment depends on the location and extent of tooth destruction:   If a small area of the tooth has been destroyed, the destroyed area will be removed and cavities will be filled with a material such as gold, silver amalgam, or composite resin.   If a large area of the tooth has been destroyed, the destroyed area will be removed and a cap (crown) will be fitted over the remaining tooth structure.   If the center part of the tooth (pulp) is affected, a procedure called a root canal will be needed before a filling or crown can be placed.   If most of the tooth has been destroyed, the tooth may need to be pulled (extracted). HOME CARE INSTRUCTIONS You can prevent, stop, or reverse dental caries at home by practicing good oral hygiene. Good oral hygiene includes:  Thoroughly cleaning your teeth at least twice a day with a toothbrush and dental floss.   Using a fluoride toothpaste. A fluoride mouth rinse may also be used if recommended by your dentist or health care provider.   Restricting the amount of sugary and starchy foods and sugary liquids you consume.   Avoiding frequent snacking on these foods and sipping of these liquids.   Keeping regular visits with a dentist for checkups and cleanings. PREVENTION   Practice good oral hygiene.  Consider a dental sealant. A dental sealant is a coating material that is applied by your dentist to the pits and grooves of teeth. The sealant prevents food from being trapped in them. It may protect the teeth for several years.  Ask about fluoride supplements if you live in a community without fluorinated water or with water that has a low fluoride content. Use fluoride supplements as directed by your dentist or health care provider.  Allow fluoride varnish applications to teeth if directed by your  dentist or health care provider. Document Released: 07/24/2002 Document Revised: 03/18/2014 Document Reviewed: 11/03/2012 [facility] Patient Information 2015 [place], [place]. This information is not intended to replace advice given to you by your health care provider. Make sure you discuss any questions you have with your health care provider.  Dental Care and Dentist Visits Dental care supports good overall health. Regular dental visits can also help you avoid dental pain, bleeding, infection, and other more serious health problems in the future. It is important to keep the mouth healthy because diseases in the teeth, gums, and other oral tissues can spread to other areas of the body. Some problems, such as diabetes, heart disease, and pre-term labor have been associated with poor oral health.  See your dentist every 6 months. If you experience emergency problems such as a toothache or broken tooth, go to the dentist right away. If you see your dentist regularly, you may catch problems early. It is easier to be treated for problems in the early stages.  WHAT TO EXPECT AT A DENTIST VISIT  Your dentist will look for many common  oral health problems and recommend proper treatment. At your regular dental visit, you can expect:  Gentle cleaning of the teeth and gums. This includes scraping and polishing. This helps to remove the sticky substance around the teeth and gums (plaque). Plaque forms in the mouth shortly after eating. Over time, plaque hardens on the teeth as tartar. If tartar is not removed regularly, it can cause problems. Cleaning also helps remove stains.  Periodic X-rays. These pictures of the teeth and supporting bone will help your dentist assess the health of your teeth.  Periodic fluoride treatments. Fluoride is a natural mineral shown to help strengthen teeth. Fluoride treatmentinvolves applying a fluoride gel or varnish to the teeth. It is most commonly done in children.  Examination  of the mouth, tongue, jaws, teeth, and gums to look for any oral health problems, such as:  Cavities (dental caries). This is decay on the tooth caused by plaque, sugar, and acid in the mouth. It is best to catch a cavity when it is small.  Inflammation of the gums caused by plaque buildup (gingivitis).  Problems with the mouth or malformed or misaligned teeth.  Oral cancer or other diseases of the soft tissues or jaws. KEEP YOUR TEETH AND GUMS HEALTHY For healthy teeth and gums, follow these general guidelines as well as your dentist's specific advice:  Have your teeth professionally cleaned at the dentist every 6 months.  Brush twice daily with a fluoride toothpaste.  Floss your teeth daily.  Ask your dentist if you need fluoride supplements, treatments, or fluoride toothpaste.  Eat a healthy diet. Reduce foods and drinks with added sugar.  Avoid smoking. TREATMENT FOR ORAL HEALTH PROBLEMS If you have oral health problems, treatment varies depending on the conditions present in your teeth and gums.  Your caregiver will most likely recommend good oral hygiene at each visit.  For cavities, gingivitis, or other oral health disease, your caregiver will perform a procedure to treat the problem. This is typically done at a separate appointment. Sometimes your caregiver will refer you to another dental specialist for specific tooth problems or for surgery. SEEK IMMEDIATE DENTAL CARE IF:  You have pain, bleeding, or soreness in the gum, tooth, jaw, or mouth area.  A permanent tooth becomes loose or separated from the gum socket.  You experience a blow or injury to the mouth or jaw area. Document Released: 07/14/2011 Document Revised: 01/24/2012 Document Reviewed: 07/14/2011 [facility]Exit[facility] Patient Information 2015 [place]ExitCare, [place]LLC. This information is not intended to replace advice given to you by your health care provider. Make sure you discuss any questions you have with your health care  provider.

## 2015-01-17 NOTE — ED Notes (Signed)
Pt complains of 1 week history of left upper jaw dental pain.

## 2015-02-21 ENCOUNTER — Inpatient Hospital Stay (HOSPITAL_COMMUNITY)
Admission: AD | Admit: 2015-02-21 | Discharge: 2015-02-21 | Disposition: A | Discharge status: Home / Self Care | Payer: Medicaid Other | Source: Ambulatory Visit | Attending: Family Medicine | Admitting: Family Medicine

## 2015-02-21 DIAGNOSIS — Z3200 Encounter for pregnancy test, result unknown: Secondary | ICD-10-CM | POA: Diagnosis present

## 2015-02-21 DIAGNOSIS — N912 Amenorrhea, unspecified: Secondary | ICD-10-CM | POA: Diagnosis not present

## 2015-02-21 DIAGNOSIS — Z3202 Encounter for pregnancy test, result negative: Secondary | ICD-10-CM | POA: Insufficient documentation

## 2015-02-21 LAB — URINALYSIS, ROUTINE W REFLEX MICROSCOPIC
Bilirubin Urine: NEGATIVE
Glucose, UA: NEGATIVE mg/dL
Hgb urine dipstick: NEGATIVE
Ketones, ur: NEGATIVE mg/dL
Nitrite: NEGATIVE
Protein, ur: NEGATIVE mg/dL
Specific Gravity, Urine: 1.025 (ref 1.005–1.030)
Urobilinogen, UA: 4 mg/dL — ABNORMAL HIGH (ref 0.0–1.0)
pH: 5.5 (ref 5.0–8.0)

## 2015-02-21 LAB — HCG, SERUM, QUALITATIVE: PREG SERUM: NEGATIVE

## 2015-02-21 LAB — POCT PREGNANCY, URINE: PREG TEST UR: NEGATIVE

## 2015-02-21 LAB — URINE MICROSCOPIC-ADD ON

## 2015-02-21 NOTE — MAU Note (Addendum)
Did not find out was pregnant until 27wks with last pregnancy. Feels like something is "jumping" in my stomach like a heartbeat. Has not done upt. LMP 11/22/14. Had Depo 11/20/14. Just wants upt and if negative, possibly blood test for pregnancy but does not want pelvic exam or anything

## 2015-02-21 NOTE — Discharge Instructions (Signed)

## 2015-02-21 NOTE — MAU Provider Note (Signed)
28 y.o.G2P1011presents to the MAU stating she wants to know if she is pregnant. She received Depo Provera in January.   BP 148/80 mmHg  Pulse 92  Temp(Src) 98.2 F (36.8 C)  Resp 20  Ht 5\' 6"  (1.676 m)  Wt 117.119 kg (258 lb 3.2 oz)  BMI 41.69 kg/m2  LMP 11/22/2014  Results for orders placed or performed during the hospital encounter of 02/21/15 (from the past 24 hour(s))  Urinalysis, Routine w reflex microscopic     Status: Abnormal   Collection Time: 02/21/15  8:20 PM  Result Value Ref Range   Color, Urine YELLOW YELLOW   APPearance CLEAR CLEAR   Specific Gravity, Urine 1.025 1.005 - 1.030   pH 5.5 5.0 - 8.0   Glucose, UA NEGATIVE NEGATIVE mg/dL   Hgb urine dipstick NEGATIVE NEGATIVE   Bilirubin Urine NEGATIVE NEGATIVE   Ketones, ur NEGATIVE NEGATIVE mg/dL   Protein, ur NEGATIVE NEGATIVE mg/dL   Urobilinogen, UA 4.0 (H) 0.0 - 1.0 mg/dL   Nitrite NEGATIVE NEGATIVE   Leukocytes, UA SMALL (A) NEGATIVE  Urine microscopic-add on     Status: Abnormal   Collection Time: 02/21/15  8:20 PM  Result Value Ref Range   Squamous Epithelial / LPF FEW (A) RARE   WBC, UA 0-2 <3 WBC/hpf   RBC / HPF 0-2 <3 RBC/hpf   Bacteria, UA RARE RARE   Urine-Other MUCOUS PRESENT   Pregnancy, urine POC     Status: None   Collection Time: 02/21/15  8:24 PM  Result Value Ref Range   Preg Test, Ur NEGATIVE NEGATIVE  hCG, serum, qualitative     Status: None   Collection Time: 02/21/15  8:25 PM  Result Value Ref Range   Preg, Serum NEGATIVE NEGATIVE    A: Absence of menses P: Discharge to home

## 2015-02-21 NOTE — MAU Note (Signed)
Pt to Triage and Illene BolusLori Clemmons CNM in to discuss test results and d/c plan. Pt d/c home from Triage.

## 2015-02-26 ENCOUNTER — Encounter (HOSPITAL_COMMUNITY): Payer: Self-pay

## 2015-02-26 ENCOUNTER — Emergency Department (INDEPENDENT_AMBULATORY_CARE_PROVIDER_SITE_OTHER)
Admission: EM | Admit: 2015-02-26 | Discharge: 2015-02-26 | Disposition: A | Discharge status: Home / Self Care | Payer: Medicaid Other | Source: Home / Self Care | Attending: Emergency Medicine | Admitting: Emergency Medicine

## 2015-02-26 DIAGNOSIS — K088 Other specified disorders of teeth and supporting structures: Secondary | ICD-10-CM | POA: Diagnosis not present

## 2015-02-26 DIAGNOSIS — K0889 Other specified disorders of teeth and supporting structures: Secondary | ICD-10-CM

## 2015-02-26 MED ORDER — IBUPROFEN 800 MG PO TABS: 800.0000 mg | ORAL_TABLET | Freq: Three times a day (TID) | ORAL | Status: DC

## 2015-02-26 MED ORDER — AMOXICILLIN 500 MG PO CAPS: 1000.0000 mg | ORAL_CAPSULE | Freq: Two times a day (BID) | ORAL | Status: DC

## 2015-02-26 MED ORDER — OXYCODONE-ACETAMINOPHEN 5-325 MG PO TABS: 1.0000 | ORAL_TABLET | Freq: Four times a day (QID) | ORAL | Status: DC | PRN

## 2015-02-26 NOTE — ED Notes (Signed)
Reports toothache x 2 days. Has an appointment to see provider for extraction on 4-21. Advised to be sure to keep her appointment

## 2015-02-26 NOTE — Discharge Instructions (Signed)
Dental Pain °A tooth ache may be caused by cavities (tooth decay). Cavities expose the nerve of the tooth to air and hot or cold temperatures. It may come from an infection or abscess (also called a boil or furuncle) around your tooth. It is also often caused by dental caries (tooth decay). This causes the pain you are having. °DIAGNOSIS  °Your caregiver can diagnose this problem by exam. °TREATMENT  °· If caused by an infection, it may be treated with medications which kill germs (antibiotics) and pain medications as prescribed by your caregiver. Take medications as directed. °· Only take over-the-counter or prescription medicines for pain, discomfort, or fever as directed by your caregiver. °· Whether the tooth ache today is caused by infection or dental disease, you should see your dentist as soon as possible for further care. °SEEK MEDICAL CARE IF: °The exam and treatment you received today has been provided on an emergency basis only. This is not a substitute for complete medical or dental care. If your problem worsens or new problems (symptoms) appear, and you are unable to meet with your dentist, call or return to this location. °SEEK IMMEDIATE MEDICAL CARE IF:  °· You have a fever. °· You develop redness and swelling of your face, jaw, or neck. °· You are unable to open your mouth. °· You have severe pain uncontrolled by pain medicine. °MAKE SURE YOU:  °· Understand these instructions. °· Will watch your condition. °· Will get help right away if you are not doing well or get worse. °Document Released: 11/01/2005 Document Revised: 01/24/2012 Document Reviewed: 06/19/2008 °ExitCare® Patient Information ©2015 [employer]. This information is not intended to replace advice given to you by your health care provider. Make sure you discuss any questions you have with your health care provider. ° °Dental Care and Dentist Visits °Dental care supports good overall health. Regular dental visits can also help you  avoid dental pain, bleeding, infection, and other more serious health problems in the future. It is important to keep the mouth healthy because diseases in the teeth, gums, and other oral tissues can spread to other areas of the body. Some problems, such as diabetes, heart disease, and pre-term labor have been associated with poor oral health.  °See your dentist every 6 months. If you experience emergency problems such as a toothache or broken tooth, go to the dentist right away. If you see your dentist regularly, you may catch problems early. It is easier to be treated for problems in the early stages.  °WHAT TO EXPECT AT A DENTIST VISIT  °Your dentist will look for many common oral health problems and recommend proper treatment. At your regular dental visit, you can expect: °· Gentle cleaning of the teeth and gums. This includes scraping and polishing. This helps to remove the sticky substance around the teeth and gums (plaque). Plaque forms in the mouth shortly after eating. Over time, plaque hardens on the teeth as tartar. If tartar is not removed regularly, it can cause problems. Cleaning also helps remove stains. °· Periodic X-rays. These pictures of the teeth and supporting bone will help your dentist assess the health of your teeth. °· Periodic fluoride treatments. Fluoride is a natural mineral shown to help strengthen teeth. Fluoride treatment involves applying a fluoride gel or varnish to the teeth. It is most commonly done in children. °· Examination of the mouth, tongue, jaws, teeth, and gums to look for any oral health problems, such as: °¨ Cavities (dental caries). This is   decay on the tooth caused by plaque, sugar, and acid in the mouth. It is best to catch a cavity when it is small. °¨ Inflammation of the gums caused by plaque buildup (gingivitis). °¨ Problems with the mouth or malformed or misaligned teeth. °¨ Oral cancer or other diseases of the soft tissues or jaws.  °KEEP YOUR TEETH AND GUMS  HEALTHY °For healthy teeth and gums, follow these general guidelines as well as your dentist's specific advice: °· Have your teeth professionally cleaned at the dentist every 6 months. °· Brush twice daily with a fluoride toothpaste. °· Floss your teeth daily.  °· Ask your dentist if you need fluoride supplements, treatments, or fluoride toothpaste. °· Eat a healthy diet. Reduce foods and drinks with added sugar. °· Avoid smoking. °TREATMENT FOR ORAL HEALTH PROBLEMS °If you have oral health problems, treatment varies depending on the conditions present in your teeth and gums. °· Your caregiver will most likely recommend good oral hygiene at each visit. °· For cavities, gingivitis, or other oral health disease, your caregiver will perform a procedure to treat the problem. This is typically done at a separate appointment. Sometimes your caregiver will refer you to another dental specialist for specific tooth problems or for surgery. °SEEK IMMEDIATE DENTAL CARE IF: °· You have pain, bleeding, or soreness in the gum, tooth, jaw, or mouth area. °· A permanent tooth becomes loose or separated from the gum socket. °· You experience a blow or injury to the mouth or jaw area. °Document Released: 07/14/2011 Document Revised: 01/24/2012 Document Reviewed: 07/14/2011 °ExitCare® Patient Information ©2015 [employer]. This information is not intended to replace advice given to you by your health care provider. Make sure you discuss any questions you have with your health care provider. ° °

## 2015-02-26 NOTE — ED Provider Notes (Signed)
CSN: [id number]     Arrival date & time 02/26/15  1232 History   First MD Initiated Contact with Patient 02/26/15 1315     Chief Complaint  Patient presents with  . Dental Pain   (Consider location/radiation/quality/duration/timing/severity/associated sxs/prior Treatment) HPI Comments: Recurrance of chronic dental pain upper right teeth   Past Medical History  Diagnosis Date  . Migraine   . Miscarriage   . Kidney infection   . Migraines   . Kidney infection    Past Surgical History  Procedure Laterality Date  . Cesarean section  2011  . Cesarean section    . Cesarean section     Family History  Problem Relation Age of Onset  . Asthma Maternal Uncle   . Cancer Maternal Grandmother     breast cancer  . Cancer Paternal Grandmother   . Diabetes Mother   . Hypertension Father   . Diabetes Father    History  Substance Use Topics  . Smoking status: Current Some Day Smoker -- 0.25 packs/day for 7 years    Types: Cigarettes  . Smokeless tobacco: Never Used  . Alcohol Use: Yes     Comment: occasionally   OB History    Gravida Para Term Preterm AB TAB SAB Ectopic Multiple Living   2 1 1  1  1   1       Obstetric Comments   LMP 12/25/14     Review of Systems  Constitutional: Negative.   HENT: Positive for dental problem.   All other systems reviewed and are negative.   Allergies  Kiwi extract; Peach; Strawberry; and Vicodin  Home Medications   Prior to Admission medications   Medication Sig Start Date End Date Taking? Authorizing Provider  amoxicillin (AMOXIL) 500 MG capsule Take 2 capsules (1,000 mg total) by mouth 2 (two) times daily. 02/26/15   Hayden Rasmussenavid Daryan Cagley, NP  ibuprofen (ADVIL,MOTRIN) 800 MG tablet Take 1 tablet (800 mg total) by mouth 3 (three) times daily. 02/26/15   Hayden Rasmussenavid Deshia Vanderhoof, NP  oxyCODONE-acetaminophen (PERCOCET/ROXICET) 5-325 MG per tablet Take 1-2 tablets by mouth every 6 (six) hours as needed for severe pain. 02/26/15   Hayden Rasmussenavid Alyda Megna, NP   BP 114/68 mmHg   Pulse 75  Temp(Src) 99 F (37.2 C) (Oral)  Resp 18  SpO2 100%  LMP 11/22/2014 Physical Exam  Constitutional: She is oriented to person, place, and time. She appears well-developed and well-nourished. No distress.  HENT:  Mouth/Throat: No oropharyngeal exudate.  Right upper molars denuded, gingival hypertrophy and inflammation, tender. No abscess seen.  Neck: Normal range of motion. Neck supple.  Lymphadenopathy:    She has no cervical adenopathy.  Neurological: She is alert and oriented to person, place, and time.  Skin: Skin is warm and dry.  Nursing note and vitals reviewed.   ED Course  Procedures (including critical care time) Labs Review Labs Reviewed - No data to display  Imaging Review No results found.   MDM   1. Pain, dental    Percocet 5 mg #10 Motrin 800mg   Amoxicillin Keep appt next week with dentist   Hayden Rasmussenavid Antoino Westhoff, NP 02/26/15 1352

## 2015-03-04 ENCOUNTER — Encounter (HOSPITAL_COMMUNITY): Payer: Self-pay | Admitting: Neurology

## 2015-03-04 ENCOUNTER — Emergency Department (HOSPITAL_COMMUNITY)
Admission: EM | Admit: 2015-03-04 | Discharge: 2015-03-04 | Disposition: A | Discharge status: Home / Self Care | Payer: Medicaid Other | Attending: Emergency Medicine | Admitting: Emergency Medicine

## 2015-03-04 DIAGNOSIS — Z793 Long term (current) use of hormonal contraceptives: Secondary | ICD-10-CM | POA: Diagnosis not present

## 2015-03-04 DIAGNOSIS — Z792 Long term (current) use of antibiotics: Secondary | ICD-10-CM | POA: Diagnosis not present

## 2015-03-04 DIAGNOSIS — Z87448 Personal history of other diseases of urinary system: Secondary | ICD-10-CM | POA: Insufficient documentation

## 2015-03-04 DIAGNOSIS — Z720 Tobacco use: Secondary | ICD-10-CM | POA: Insufficient documentation

## 2015-03-04 DIAGNOSIS — Z3202 Encounter for pregnancy test, result negative: Secondary | ICD-10-CM | POA: Insufficient documentation

## 2015-03-04 DIAGNOSIS — Z8679 Personal history of other diseases of the circulatory system: Secondary | ICD-10-CM | POA: Diagnosis not present

## 2015-03-04 DIAGNOSIS — R238 Other skin changes: Secondary | ICD-10-CM | POA: Diagnosis not present

## 2015-03-04 DIAGNOSIS — Z791 Long term (current) use of non-steroidal anti-inflammatories (NSAID): Secondary | ICD-10-CM | POA: Diagnosis not present

## 2015-03-04 DIAGNOSIS — Z3200 Encounter for pregnancy test, result unknown: Secondary | ICD-10-CM | POA: Diagnosis present

## 2015-03-04 LAB — POC URINE PREG, ED: Preg Test, Ur: NEGATIVE

## 2015-03-04 NOTE — Discharge Instructions (Signed)
Acne  Acne is a skin problem that causes pimples. Acne occurs when the pores in your skin get blocked. Your pores may become red, sore, and swollen (inflamed), or infected with a common skin bacterium (Propionibacterium acnes). Acne is a common skin problem. Up to 80% of people get acne at some time. Acne is especially common from the ages of 12 to 24. Acne usually goes away over time with proper treatment.  CAUSES   Your pores each contain an oil gland. The oil glands make an oily substance called sebum. Acne happens when these glands get plugged with sebum, dead skin cells, and dirt. The P. acnes bacteria that are normally found in the oil glands then multiply, causing inflammation. Acne is commonly triggered by changes in your hormones. These hormonal changes can cause the oil glands to get bigger and to make more sebum. Factors that can make acne worse include:   Hormone changes during adolescence.   Hormone changes during women's menstrual cycles.   Hormone changes during pregnancy.   Oil-based cosmetics and hair products.   Harshly scrubbing the skin.   Strong soaps.   Stress.   Hormone problems due to certain diseases.   Long or oily hair rubbing against the skin.   Certain medicines.   Pressure from headbands, backpacks, or shoulder pads.   Exposure to certain oils and chemicals.  SYMPTOMS   Acne often occurs on the face, neck, chest, and upper back. Symptoms include:   Small, red bumps (pimples or papules).   Whiteheads (closed comedones).   Blackheads (open comedones).   Small, pus-filled pimples (pustules).   Big, red pimples or pustules that feel tender.  More severe acne can cause:   An infected area that contains a collection of pus (abscess).   Hard, painful, fluid-filled sacs (cysts).   Scars.  DIAGNOSIS   Your caregiver can usually tell what the problem is by doing a physical exam.  TREATMENT   There are many good treatments for acne. Some are available over the counter and some  are available with a prescription. The treatment that is best for you depends on the type of acne you have and how severe it is. It may take 2 months of treatment before your acne gets better. Common treatments include:   Creams and lotions that prevent oil glands from clogging.   Creams and lotions that treat or prevent infections and inflammation.   Antibiotics applied to the skin or taken as a pill.   Pills that decrease sebum production.   Birth control pills.   Light or laser treatments.   Minor surgery.   Injections of medicine into the affected areas.   Chemicals that cause peeling of the skin.  HOME CARE INSTRUCTIONS   Good skin care is the most important part of treatment.   Wash your skin gently at least twice a day and after exercise. Always wash your skin before bed.   Use mild soap.   After each wash, apply a water-based skin moisturizer.   Keep your hair clean and off of your face. Shampoo your hair daily.   Only take medicines as directed by your caregiver.   Use a sunscreen or sunblock with SPF 30 or greater. This is especially important when you are using acne medicines.   Choose cosmetics that are noncomedogenic. This means they do not plug the oil glands.   Avoid leaning your chin or forehead on your hands.   Avoid wearing tight headbands or hats.     Avoid picking or squeezing your pimples. This can make your acne worse and cause scarring.  SEEK MEDICAL CARE IF:    Your acne is not better after 8 weeks.   Your acne gets worse.   You have a large area of skin that is red or tender.  Document Released: 10/29/2000 Document Revised: 03/18/2014 Document Reviewed: 08/20/2011  Exit[employer] Patient Information 2015 [employer]. This information is not intended to replace advice given to you by your health care provider. Make sure you discuss any questions you have with your health care provider.

## 2015-03-04 NOTE — ED Provider Notes (Signed)
CSN: [id number]     Arrival date & time 03/04/15  0808 History   First MD Initiated Contact with Patient 03/04/15 [phone]     Chief Complaint  Patient presents with  . Possible Pregnancy     (Consider location/radiation/quality/duration/timing/severity/associated sxs/prior Treatment) HPI   PCP: No PCP Per Patient Blood pressure 120/46, pulse 76, temperature 98 F (36.7 C), temperature source Oral, resp. rate 18, height  (1.676 m), weight 230 lb (104.327 kg), last menstrual period 11/22/2014, SpO2 100 %.  Amy Mcfarland is a 29 y.o.female with a significant PMH of migraine, miscarriage, kidney infection,  presents to the ER with complaints of needing a pregnancy test and a bump on her nose. She reports feeling like she might be pregnant, she is unable to describe specific symptoms or reasons why. She was seen at [facility] for the same thing a few days ago and had a negative pregnancy test. She has not had any nausea, vomiting, diarrhea, vaginal discharge, vaginal bleeding, abdominal pain, fevers, or weakness. She also reports a bump on her nose that is painful and started this morning.   Past Medical History  Diagnosis Date  . Migraine   . Miscarriage   . Kidney infection   . Migraines   . Kidney infection    Past Surgical History  Procedure Laterality Date  . Cesarean section  2011  . Cesarean section    . Cesarean section     Family History  Problem Relation Age of Onset  . Asthma Maternal Uncle   . Cancer Maternal Grandmother     breast cancer  . Cancer Paternal Grandmother   . Diabetes Mother   . Hypertension Father   . Diabetes Father    History  Substance Use Topics  . Smoking status: Current Some Day Smoker -- 0.25 packs/day for 7 years    Types: Cigarettes  . Smokeless tobacco: Never Used  . Alcohol Use: Yes     Comment: occasionally   OB History    Gravida Para Term Preterm AB TAB SAB Ectopic Multiple Living   Obstetric  Comments   LMP 12/25/14     Review of Systems  10 Systems reviewed and are negative for acute change except as noted in the HPI.   Allergies  Kiwi extract; Peach; Strawberry; and Vicodin  Home Medications   Prior to Admission medications   Medication Sig Start Date End Date Taking? Authorizing Provider  amoxicillin (AMOXIL) 500 MG capsule Take 2 capsules (1,000 mg total) by mouth 2 (two) times daily. 02/26/15  Yes Hayden Rasmussen, NP  fluticasone (FLONASE) 50 MCG/ACT nasal spray Place 1 spray into both nostrils daily as needed for allergies or rhinitis.   Yes Historical Provider, MD  ibuprofen (ADVIL,MOTRIN) 800 MG tablet Take 1 tablet (800 mg total) by mouth 3 (three) times daily. Patient taking differently: Take 800 mg by mouth every 8 (eight) hours as needed for mild pain or moderate pain.  02/26/15  Yes Hayden Rasmussen, NP  medroxyPROGESTERone (DEPO-PROVERA) 150 MG/ML injection Inject 150 mg into the muscle every 3 (three) months.   Yes Historical Provider, MD  oxyCODONE-acetaminophen (PERCOCET/ROXICET) 5-325 MG per tablet Take 1-2 tablets by mouth every 6 (six) hours as needed for severe pain. Patient not taking: Reported on 03/04/2015 02/26/15   Hayden Rasmussen, NP   BP 120/46 mmHg  Pulse 76  Temp(Src) 98 F (36.7 C) (Oral)  Resp 18  Ht 5\' 6"  (1.676 m)  Wt 230 lb (104.327 kg)  BMI 37.14 kg/m2  SpO2 100%  LMP 11/22/2014 Physical Exam  Constitutional: She appears well-developed and well-nourished. No distress.  HENT:  Head: Normocephalic and atraumatic.  Nose:    Eyes: Pupils are equal, round, and reactive to light.  Neck: Normal range of motion. Neck supple.  Cardiovascular: Normal rate and regular rhythm.   Pulmonary/Chest: Effort normal.  Abdominal: Soft. Bowel sounds are normal. There is no tenderness. There is no rigidity, no rebound, no guarding and no CVA tenderness.  Neurological: She is alert.  Skin: Skin is warm and dry.  Nursing note and vitals reviewed.   ED Course    Procedures (including critical care time) Labs Review Labs Reviewed  POC URINE PREG, ED    Imaging Review No results found.   EKG Interpretation None      MDM   Final diagnoses:  Pimples    Patient has a negative urine pregnancy test. - On depo and has chronically abnormal menstrual cycles. Pt has a small pimple to her right nare. I recommend hot compress. She  Does not want to "pop" it because she feels she will scar. Discussed return to ED precautions.   28 y.o.Amy Mcfarland's evaluation in the Emergency Department is complete. It has been determined that no acute conditions requiring further emergency intervention are present at this time. The patient/guardian have been advised of the diagnosis and plan. We have discussed signs and symptoms that warrant return to the ED, such as changes or worsening in symptoms.  Vital signs are stable at discharge. Filed Vitals:   03/04/15 0946  BP: 120/46  Pulse: 76  Temp:   Resp:     Patient/guardian has voiced understanding and agreed to follow-up with the PCP or specialist.     Marlon Peliffany Jhayla Podgorski, PA-C 03/04/15 1031  Gerhard Munchobert Lockwood, MD 03/07/15 [phone]

## 2015-03-04 NOTE — ED Notes (Signed)
Pt reports she feels like she is pregnant; has negative preg test. Reports in past was pregnant with neg tests. Also bump to inside of right nose. Irregular periods.

## 2015-03-09 ENCOUNTER — Emergency Department (INDEPENDENT_AMBULATORY_CARE_PROVIDER_SITE_OTHER)
Admission: EM | Admit: 2015-03-09 | Discharge: 2015-03-09 | Disposition: A | Discharge status: Home / Self Care | Payer: Medicaid Other | Source: Home / Self Care | Attending: Emergency Medicine | Admitting: Emergency Medicine

## 2015-03-09 ENCOUNTER — Encounter (HOSPITAL_COMMUNITY): Payer: Self-pay | Admitting: Emergency Medicine

## 2015-03-09 DIAGNOSIS — K047 Periapical abscess without sinus: Secondary | ICD-10-CM

## 2015-03-09 MED ORDER — OXYCODONE-ACETAMINOPHEN 5-325 MG PO TABS: 1.0000 | ORAL_TABLET | Freq: Four times a day (QID) | ORAL | Status: DC | PRN

## 2015-03-09 MED ORDER — IBUPROFEN 800 MG PO TABS
800.0000 mg | ORAL_TABLET | Freq: Three times a day (TID) | ORAL | Status: DC | PRN
Start: 2015-03-09 — End: 2015-03-21

## 2015-03-09 MED ORDER — AMOXICILLIN 500 MG PO CAPS: 1000.0000 mg | ORAL_CAPSULE | Freq: Two times a day (BID) | ORAL | Status: DC

## 2015-03-09 NOTE — ED Provider Notes (Signed)
CSN: [id number]     Arrival date & time 03/09/15  1058 History   First MD Initiated Contact with Patient 03/09/15 1353     Chief Complaint  Patient presents with  . Dental Pain   (Consider location/radiation/quality/duration/timing/severity/associated sxs/prior Treatment) HPI  She is a 29 year old woman here for evaluation of dental infection. She was seen here earlier this month for the same issue. She saw the dentist and was scheduled to have the teeth pulled last week, but she was unable to make her appointment due to court. She has rescheduled her dental appointment for May 5. Over the last several days she has developed pain, redness, swelling around the left lower tooth. She denies any fevers or jaw swelling. She's been using Orajel without improvement.  Past Medical History  Diagnosis Date  . Migraine   . Miscarriage   . Kidney infection   . Migraines   . Kidney infection    Past Surgical History  Procedure Laterality Date  . Cesarean section  2011  . Cesarean section    . Cesarean section     Family History  Problem Relation Age of Onset  . Asthma Maternal Uncle   . Cancer Maternal Grandmother     breast cancer  . Cancer Paternal Grandmother   . Diabetes Mother   . Hypertension Father   . Diabetes Father    History  Substance Use Topics  . Smoking status: Current Some Day Smoker -- 0.25 packs/day for 7 years    Types: Cigarettes  . Smokeless tobacco: Never Used  . Alcohol Use: Yes     Comment: occasionally   OB History    Gravida Para Term Preterm AB TAB SAB Ectopic Multiple Living   2 1 1  1  1   1       Obstetric Comments   LMP 12/25/14     Review of Systems As in history of present illness Allergies  Kiwi extract; Peach; Strawberry; and Vicodin  Home Medications   Prior to Admission medications   Medication Sig Start Date End Date Taking? Authorizing Provider  amoxicillin (AMOXIL) 500 MG capsule Take 2 capsules (1,000 mg total) by mouth 2 (two)  times daily. 03/09/15   Charm RingsErin J Alesa Echevarria, MD  fluticasone (FLONASE) 50 MCG/ACT nasal spray Place 1 spray into both nostrils daily as needed for allergies or rhinitis.    Historical Provider, MD  ibuprofen (ADVIL,MOTRIN) 800 MG tablet Take 1 tablet (800 mg total) by mouth every 8 (eight) hours as needed. 03/09/15   Charm RingsErin J Yunuen Mordan, MD  medroxyPROGESTERone (DEPO-PROVERA) 150 MG/ML injection Inject 150 mg into the muscle every 3 (three) months.    Historical Provider, MD  oxyCODONE-acetaminophen (PERCOCET/ROXICET) 5-325 MG per tablet Take 1-2 tablets by mouth every 6 (six) hours as needed for severe pain. 03/09/15   Charm RingsErin J Cianni Manny, MD   BP 114/76 mmHg  Pulse 76  Temp(Src) 98.6 F (37 C) (Oral)  Resp 18  SpO2 99%  LMP 11/22/2014 Physical Exam  Constitutional: She is oriented to person, place, and time. She appears well-developed and well-nourished. No distress.  HENT:  Mouth/Throat: Abnormal dentition.    Cardiovascular: Normal rate.   Pulmonary/Chest: Effort normal.  Neurological: She is alert and oriented to person, place, and time.    ED Course  Procedures (including critical care time) Labs Review Labs Reviewed - No data to display  Imaging Review No results found.   MDM   1. Dental abscess    We'll treat with  amoxicillin, ibuprofen, Percocet. Recommended saltwater gargles and ice. She will follow-up with her dentist as scheduled on May 5.    Charm Rings, MD 03/09/15 1420

## 2015-03-09 NOTE — Discharge Instructions (Signed)
You have a dental abscess. Take the amoxicillin as prescribed. Use ibuprofen during the day for pain. Use Percocet at night as needed for pain. Do saltwater gargles at least 3 times a day. Apply ice to the area several times a day. Follow-up with the dentist as scheduled May 5.   Abscessed Tooth An abscessed tooth is an infection around your tooth. It may be caused by holes or damage to the tooth (cavity) or a dental disease. An abscessed tooth causes mild to very bad pain in and around the tooth. See your dentist right away if you have tooth or gum pain. HOME CARE  Take your medicine as told. Finish it even if you start to feel better.  Do not drive after taking pain medicine.  Rinse your mouth (gargle) often with salt water ( teaspoon salt in 8 ounces of warm water).  Do not apply heat to the outside of your face. GET HELP RIGHT AWAY IF:   You have a temperature by mouth above 102 F (38.9 C), not controlled by medicine.  You have chills and a very bad headache.  You have problems breathing or swallowing.  Your mouth will not open.  You develop puffiness (swelling) on the neck or around the eye.  Your pain is not helped by medicine.  Your pain is getting worse instead of better. MAKE SURE YOU:   Understand these instructions.  Will watch your condition.  Will get help right away if you are not doing well or get worse. Document Released: 04/19/2008 Document Revised: 01/24/2012 Document Reviewed: 02/09/2011 [facility]Exit[facility] Patient Information 2015 [employer], [place]. This information is not intended to replace advice given to you by your health care provider. Make sure you discuss any questions you have with your health care provider.

## 2015-03-09 NOTE — ED Notes (Signed)
C/o  Bottom left tooth pain since the 19th of last month.  Pt states that she was not able to get tooth pulled.  No relief with otc meds.

## 2015-03-14 ENCOUNTER — Encounter: Payer: Self-pay | Admitting: Obstetrics & Gynecology

## 2015-03-14 ENCOUNTER — Ambulatory Visit (INDEPENDENT_AMBULATORY_CARE_PROVIDER_SITE_OTHER): Payer: Medicaid Other | Admitting: Obstetrics & Gynecology

## 2015-03-14 VITALS — BP 115/63 | HR 63 | Temp 98.3°F | Wt 256.6 lb

## 2015-03-14 DIAGNOSIS — R519 Headache, unspecified: Secondary | ICD-10-CM

## 2015-03-14 DIAGNOSIS — R51 Headache: Secondary | ICD-10-CM

## 2015-03-14 DIAGNOSIS — Z3202 Encounter for pregnancy test, result negative: Secondary | ICD-10-CM | POA: Diagnosis not present

## 2015-03-14 DIAGNOSIS — Z01419 Encounter for gynecological examination (general) (routine) without abnormal findings: Secondary | ICD-10-CM

## 2015-03-14 LAB — POCT PREGNANCY, URINE: Preg Test, Ur: NEGATIVE

## 2015-03-14 NOTE — Patient Instructions (Signed)
Preventive Care for Adults A healthy lifestyle and preventive care can promote health and wellness. Preventive health guidelines for women include the following key practices.  A routine yearly physical is a good way to check with your health care provider about your health and preventive screening. It is a chance to share any concerns and updates on your health and to receive a thorough exam.  Visit your dentist for a routine exam and preventive care every 6 months. Brush your teeth twice a day and floss once a day. Good oral hygiene prevents tooth decay and gum disease.  The frequency of eye exams is based on your age, health, family medical history, use of contact lenses, and other factors. Follow your health care provider's recommendations for frequency of eye exams.  Eat a healthy diet. Foods like vegetables, fruits, whole grains, low-fat dairy products, and lean protein foods contain the nutrients you need without too many calories. Decrease your intake of foods high in solid fats, added sugars, and salt. Eat the right amount of calories for you.Get information about a proper diet from your health care provider, if necessary.  Regular physical exercise is one of the most important things you can do for your health. Most adults should get at least 150 minutes of moderate-intensity exercise (any activity that increases your heart rate and causes you to sweat) each week. In addition, most adults need muscle-strengthening exercises on 2 or more days a week.  Maintain a healthy weight. The body mass index (BMI) is a screening tool to identify possible weight problems. It provides an estimate of body fat based on height and weight. Your health care provider can find your BMI and can help you achieve or maintain a healthy weight.For adults 20 years and older:  A BMI below 18.5 is considered underweight.  A BMI of 18.5 to 24.9 is normal.  A BMI of 25 to 29.9 is considered overweight.  A BMI of  30 and above is considered obese.  Maintain normal blood lipids and cholesterol levels by exercising and minimizing your intake of saturated fat. Eat a balanced diet with plenty of fruit and vegetables. Blood tests for lipids and cholesterol should begin at age 76 and be repeated every 5 years. If your lipid or cholesterol levels are high, you are over 50, or you are at high risk for heart disease, you may need your cholesterol levels checked more frequently.Ongoing high lipid and cholesterol levels should be treated with medicines if diet and exercise are not working.  If you smoke, find out from your health care provider how to quit. If you do not use tobacco, do not start.  Lung cancer screening is recommended for adults aged 22-80 years who are at high risk for developing lung cancer because of a history of smoking. A yearly low-dose CT scan of the lungs is recommended for people who have at least a 30-pack-year history of smoking and are a current smoker or have quit within the past 15 years. A pack year of smoking is smoking an average of 1 pack of cigarettes a day for 1 year (for example: 1 pack a day for 30 years or 2 packs a day for 15 years). Yearly screening should continue until the smoker has stopped smoking for at least 15 years. Yearly screening should be stopped for people who develop a health problem that would prevent them from having lung cancer treatment.  If you are pregnant, do not drink alcohol. If you are breastfeeding,  be very cautious about drinking alcohol. If you are not pregnant and choose to drink alcohol, do not have more than 1 drink per day. One drink is considered to be 12 ounces (355 mL) of beer, 5 ounces (148 mL) of wine, or 1.5 ounces (44 mL) of liquor.  Avoid use of street drugs. Do not share needles with anyone. Ask for help if you need support or instructions about stopping the use of drugs.  High blood pressure causes heart disease and increases the risk of  stroke. Your blood pressure should be checked at least every 1 to 2 years. Ongoing high blood pressure should be treated with medicines if weight loss and exercise do not work.  If you are 75-52 years old, ask your health care provider if you should take aspirin to prevent strokes.  Diabetes screening involves taking a blood sample to check your fasting blood sugar level. This should be done once every 3 years, after age 15, if you are within normal weight and without risk factors for diabetes. Testing should be considered at a younger age or be carried out more frequently if you are overweight and have at least 1 risk factor for diabetes.  Breast cancer screening is essential preventive care for women. You should practice "breast self-awareness." This means understanding the normal appearance and feel of your breasts and may include breast self-examination. Any changes detected, no matter how small, should be reported to a health care provider. Women in their 58s and 30s should have a clinical breast exam (CBE) by a health care provider as part of a regular health exam every 1 to 3 years. After age 16, women should have a CBE every year. Starting at age 53, women should consider having a mammogram (breast X-ray test) every year. Women who have a family history of breast cancer should talk to their health care provider about genetic screening. Women at a high risk of breast cancer should talk to their health care providers about having an MRI and a mammogram every year.  Breast cancer gene (BRCA)-related cancer risk assessment is recommended for women who have family members with BRCA-related cancers. BRCA-related cancers include breast, ovarian, tubal, and peritoneal cancers. Having family members with these cancers may be associated with an increased risk for harmful changes (mutations) in the breast cancer genes BRCA1 and BRCA2. Results of the assessment will determine the need for genetic counseling and  BRCA1 and BRCA2 testing.  Routine pelvic exams to screen for cancer are no longer recommended for nonpregnant women who are considered low risk for cancer of the pelvic organs (ovaries, uterus, and vagina) and who do not have symptoms. Ask your health care provider if a screening pelvic exam is right for you.  If you have had past treatment for cervical cancer or a condition that could lead to cancer, you need Pap tests and screening for cancer for at least 20 years after your treatment. If Pap tests have been discontinued, your risk factors (such as having a new sexual partner) need to be reassessed to determine if screening should be resumed. Some women have medical problems that increase the chance of getting cervical cancer. In these cases, your health care provider may recommend more frequent screening and Pap tests.  The HPV test is an additional test that may be used for cervical cancer screening. The HPV test looks for the virus that can cause the cell changes on the cervix. The cells collected during the Pap test can be  tested for HPV. The HPV test could be used to screen women aged 30 years and older, and should be used in women of any age who have unclear Pap test results. After the age of 30, women should have HPV testing at the same frequency as a Pap test.  Colorectal cancer can be detected and often prevented. Most routine colorectal cancer screening begins at the age of 50 years and continues through age 75 years. However, your health care provider may recommend screening at an earlier age if you have risk factors for colon cancer. On a yearly basis, your health care provider may provide home test kits to check for hidden blood in the stool. Use of a small camera at the end of a tube, to directly examine the colon (sigmoidoscopy or colonoscopy), can detect the earliest forms of colorectal cancer. Talk to your health care provider about this at age 50, when routine screening begins. Direct  exam of the colon should be repeated every 5-10 years through age 75 years, unless early forms of pre-cancerous polyps or small growths are found.  People who are at an increased risk for hepatitis B should be screened for this virus. You are considered at high risk for hepatitis B if:  You were born in a country where hepatitis B occurs often. Talk with your health care provider about which countries are considered high risk.  Your parents were born in a high-risk country and you have not received a shot to protect against hepatitis B (hepatitis B vaccine).  You have HIV or AIDS.  You use needles to inject street drugs.  You live with, or have sex with, someone who has hepatitis B.  You get hemodialysis treatment.  You take certain medicines for conditions like cancer, organ transplantation, and autoimmune conditions.  Hepatitis C blood testing is recommended for all people born from 1945 through 1965 and any individual with known risks for hepatitis C.  Practice safe sex. Use condoms and avoid high-risk sexual practices to reduce the spread of sexually transmitted infections (STIs). STIs include gonorrhea, chlamydia, syphilis, trichomonas, herpes, HPV, and human immunodeficiency virus (HIV). Herpes, HIV, and HPV are viral illnesses that have no cure. They can result in disability, cancer, and death.  You should be screened for sexually transmitted illnesses (STIs) including gonorrhea and chlamydia if:  You are sexually active and are younger than 24 years.  You are older than 24 years and your health care provider tells you that you are at risk for this type of infection.  Your sexual activity has changed since you were last screened and you are at an increased risk for chlamydia or gonorrhea. Ask your health care provider if you are at risk.  If you are at risk of being infected with HIV, it is recommended that you take a prescription medicine daily to prevent HIV infection. This is  called preexposure prophylaxis (PrEP). You are considered at risk if:  You are a heterosexual woman, are sexually active, and are at increased risk for HIV infection.  You take drugs by injection.  You are sexually active with a partner who has HIV.  Talk with your health care provider about whether you are at high risk of being infected with HIV. If you choose to begin PrEP, you should first be tested for HIV. You should then be tested every 3 months for as long as you are taking PrEP.  Osteoporosis is a disease in which the bones lose minerals and strength   with aging. This can result in serious bone fractures or breaks. The risk of osteoporosis can be identified using a bone density scan. Women ages 65 years and over and women at risk for fractures or osteoporosis should discuss screening with their health care providers. Ask your health care provider whether you should take a calcium supplement or vitamin D to reduce the rate of osteoporosis.  Menopause can be associated with physical symptoms and risks. Hormone replacement therapy is available to decrease symptoms and risks. You should talk to your health care provider about whether hormone replacement therapy is right for you.  Use sunscreen. Apply sunscreen liberally and repeatedly throughout the day. You should seek shade when your shadow is shorter than you. Protect yourself by wearing long sleeves, pants, a wide-brimmed hat, and sunglasses year round, whenever you are outdoors.  Once a month, do a whole body skin exam, using a mirror to look at the skin on your back. Tell your health care provider of new moles, moles that have irregular borders, moles that are larger than a pencil eraser, or moles that have changed in shape or color.  Stay current with required vaccines (immunizations).  Influenza vaccine. All adults should be immunized every year.  Tetanus, diphtheria, and acellular pertussis (Td, Tdap) vaccine. Pregnant women should  receive 1 dose of Tdap vaccine during each pregnancy. The dose should be obtained regardless of the length of time since the last dose. Immunization is preferred during the 27th-36th week of gestation. An adult who has not previously received Tdap or who does not know her vaccine status should receive 1 dose of Tdap. This initial dose should be followed by tetanus and diphtheria toxoids (Td) booster doses every 10 years. Adults with an unknown or incomplete history of completing a 3-dose immunization series with Td-containing vaccines should begin or complete a primary immunization series including a Tdap dose. Adults should receive a Td booster every 10 years.  Varicella vaccine. An adult without evidence of immunity to varicella should receive 2 doses or a second dose if she has previously received 1 dose. Pregnant females who do not have evidence of immunity should receive the first dose after pregnancy. This first dose should be obtained before leaving the health care facility. The second dose should be obtained 4-8 weeks after the first dose.  Human papillomavirus (HPV) vaccine. Females aged 13-26 years who have not received the vaccine previously should obtain the 3-dose series. The vaccine is not recommended for use in pregnant females. However, pregnancy testing is not needed before receiving a dose. If a female is found to be pregnant after receiving a dose, no treatment is needed. In that case, the remaining doses should be delayed until after the pregnancy. Immunization is recommended for any person with an immunocompromised condition through the age of 26 years if she did not get any or all doses earlier. During the 3-dose series, the second dose should be obtained 4-8 weeks after the first dose. The third dose should be obtained 24 weeks after the first dose and 16 weeks after the second dose.  Zoster vaccine. One dose is recommended for adults aged 60 years or older unless certain conditions are  present.  Measles, mumps, and rubella (MMR) vaccine. Adults born before 1957 generally are considered immune to measles and mumps. Adults born in 1957 or later should have 1 or more doses of MMR vaccine unless there is a contraindication to the vaccine or there is laboratory evidence of immunity to   each of the three diseases. A routine second dose of MMR vaccine should be obtained at least 28 days after the first dose for students attending postsecondary schools, health care workers, or international travelers. People who received inactivated measles vaccine or an unknown type of measles vaccine during 1963-1967 should receive 2 doses of MMR vaccine. People who received inactivated mumps vaccine or an unknown type of mumps vaccine before 1979 and are at high risk for mumps infection should consider immunization with 2 doses of MMR vaccine. For females of childbearing age, rubella immunity should be determined. If there is no evidence of immunity, females who are not pregnant should be vaccinated. If there is no evidence of immunity, females who are pregnant should delay immunization until after pregnancy. Unvaccinated health care workers born before 1957 who lack laboratory evidence of measles, mumps, or rubella immunity or laboratory confirmation of disease should consider measles and mumps immunization with 2 doses of MMR vaccine or rubella immunization with 1 dose of MMR vaccine.  Pneumococcal 13-valent conjugate (PCV13) vaccine. When indicated, a person who is uncertain of her immunization history and has no record of immunization should receive the PCV13 vaccine. An adult aged 19 years or older who has certain medical conditions and has not been previously immunized should receive 1 dose of PCV13 vaccine. This PCV13 should be followed with a dose of pneumococcal polysaccharide (PPSV23) vaccine. The PPSV23 vaccine dose should be obtained at least 8 weeks after the dose of PCV13 vaccine. An adult aged 19  years or older who has certain medical conditions and previously received 1 or more doses of PPSV23 vaccine should receive 1 dose of PCV13. The PCV13 vaccine dose should be obtained 1 or more years after the last PPSV23 vaccine dose.  Pneumococcal polysaccharide (PPSV23) vaccine. When PCV13 is also indicated, PCV13 should be obtained first. All adults aged 65 years and older should be immunized. An adult younger than age 65 years who has certain medical conditions should be immunized. Any person who resides in a nursing home or long-term care facility should be immunized. An adult smoker should be immunized. People with an immunocompromised condition and certain other conditions should receive both PCV13 and PPSV23 vaccines. People with human immunodeficiency virus (HIV) infection should be immunized as soon as possible after diagnosis. Immunization during chemotherapy or radiation therapy should be avoided. Routine use of PPSV23 vaccine is not recommended for American Indians, [place] Natives, or people younger than 65 years unless there are medical conditions that require PPSV23 vaccine. When indicated, people who have unknown immunization and have no record of immunization should receive PPSV23 vaccine. One-time revaccination 5 years after the first dose of PPSV23 is recommended for people aged 19-64 years who have chronic kidney failure, nephrotic syndrome, asplenia, or immunocompromised conditions. People who received 1-2 doses of PPSV23 before age 65 years should receive another dose of PPSV23 vaccine at age 65 years or later if at least 5 years have passed since the previous dose. Doses of PPSV23 are not needed for people immunized with PPSV23 at or after age 65 years.  Meningococcal vaccine. Adults with asplenia or persistent complement component deficiencies should receive 2 doses of quadrivalent meningococcal conjugate (MenACWY-D) vaccine. The doses should be obtained at least 2 months apart.  Microbiologists working with certain meningococcal bacteria, military recruits, people at risk during an outbreak, and people who travel to or live in countries with a high rate of meningitis should be immunized. A first-year college student up through age   21 years who is living in a residence hall should receive a dose if she did not receive a dose on or after her 16th birthday. Adults who have certain high-risk conditions should receive one or more doses of vaccine.  Hepatitis A vaccine. Adults who wish to be protected from this disease, have certain high-risk conditions, work with hepatitis A-infected animals, work in hepatitis A research labs, or travel to or work in countries with a high rate of hepatitis A should be immunized. Adults who were previously unvaccinated and who anticipate close contact with an international adoptee during the first 60 days after arrival in the [place] from a country with a high rate of hepatitis A should be immunized.  Hepatitis B vaccine. Adults who wish to be protected from this disease, have certain high-risk conditions, may be exposed to blood or other infectious body fluids, are household contacts or sex partners of hepatitis B positive people, are clients or workers in certain care facilities, or travel to or work in countries with a high rate of hepatitis B should be immunized.  Haemophilus influenzae type b (Hib) vaccine. A previously unvaccinated person with asplenia or sickle cell disease or having a scheduled splenectomy should receive 1 dose of Hib vaccine. Regardless of previous immunization, a recipient of a hematopoietic stem cell transplant should receive a 3-dose series 6-12 months after her successful transplant. Hib vaccine is not recommended for adults with HIV infection. Preventive Services / Frequency Ages 64 to 68 years  Blood pressure check.** / Every 1 to 2 years.  Lipid and cholesterol check.** / Every 5 years beginning at age  22.  Clinical breast exam.** / Every 3 years for women in their 88s and 53s.  BRCA-related cancer risk assessment.** / For women who have family members with a BRCA-related cancer (breast, ovarian, tubal, or peritoneal cancers).  Pap test.** / Every 2 years from ages 90 through 51. Every 3 years starting at age 21 through age 56 or 3 with a history of 3 consecutive normal Pap tests.  HPV screening.** / Every 3 years from ages 24 through ages 1 to 46 with a history of 3 consecutive normal Pap tests.  Hepatitis C blood test.** / For any individual with known risks for hepatitis C.  Skin self-exam. / Monthly.  Influenza vaccine. / Every year.  Tetanus, diphtheria, and acellular pertussis (Tdap, Td) vaccine.** / Consult your health care provider. Pregnant women should receive 1 dose of Tdap vaccine during each pregnancy. 1 dose of Td every 10 years.  Varicella vaccine.** / Consult your health care provider. Pregnant females who do not have evidence of immunity should receive the first dose after pregnancy.  HPV vaccine. / 3 doses over 6 months, if 72 and younger. The vaccine is not recommended for use in pregnant females. However, pregnancy testing is not needed before receiving a dose.  Measles, mumps, rubella (MMR) vaccine.** / You need at least 1 dose of MMR if you were born in 1957 or later. You may also need a 2nd dose. For females of childbearing age, rubella immunity should be determined. If there is no evidence of immunity, females who are not pregnant should be vaccinated. If there is no evidence of immunity, females who are pregnant should delay immunization until after pregnancy.  Pneumococcal 13-valent conjugate (PCV13) vaccine.** / Consult your health care provider.  Pneumococcal polysaccharide (PPSV23) vaccine.** / 1 to 2 doses if you smoke cigarettes or if you have certain conditions.  Meningococcal vaccine.** /  1 dose if you are age 19 to 21 years and a first-year college  student living in a residence hall, or have one of several medical conditions, you need to get vaccinated against meningococcal disease. You may also need additional booster doses.  Hepatitis A vaccine.** / Consult your health care provider.  Hepatitis B vaccine.** / Consult your health care provider.  Haemophilus influenzae type b (Hib) vaccine.** / Consult your health care provider. Ages 40 to 64 years  Blood pressure check.** / Every 1 to 2 years.  Lipid and cholesterol check.** / Every 5 years beginning at age 20 years.  Lung cancer screening. / Every year if you are aged 55-80 years and have a 30-pack-year history of smoking and currently smoke or have quit within the past 15 years. Yearly screening is stopped once you have quit smoking for at least 15 years or develop a health problem that would prevent you from having lung cancer treatment.  Clinical breast exam.** / Every year after age 40 years.  BRCA-related cancer risk assessment.** / For women who have family members with a BRCA-related cancer (breast, ovarian, tubal, or peritoneal cancers).  Mammogram.** / Every year beginning at age 40 years and continuing for as long as you are in good health. Consult with your health care provider.  Pap test.** / Every 3 years starting at age 30 years through age 65 or 70 years with a history of 3 consecutive normal Pap tests.  HPV screening.** / Every 3 years from ages 30 years through ages 65 to 70 years with a history of 3 consecutive normal Pap tests.  Fecal occult blood test (FOBT) of stool. / Every year beginning at age 50 years and continuing until age 75 years. You may not need to do this test if you get a colonoscopy every 10 years.  Flexible sigmoidoscopy or colonoscopy.** / Every 5 years for a flexible sigmoidoscopy or every 10 years for a colonoscopy beginning at age 50 years and continuing until age 75 years.  Hepatitis C blood test.** / For all people born from 1945 through  1965 and any individual with known risks for hepatitis C.  Skin self-exam. / Monthly.  Influenza vaccine. / Every year.  Tetanus, diphtheria, and acellular pertussis (Tdap/Td) vaccine.** / Consult your health care provider. Pregnant women should receive 1 dose of Tdap vaccine during each pregnancy. 1 dose of Td every 10 years.  Varicella vaccine.** / Consult your health care provider. Pregnant females who do not have evidence of immunity should receive the first dose after pregnancy.  Zoster vaccine.** / 1 dose for adults aged 60 years or older.  Measles, mumps, rubella (MMR) vaccine.** / You need at least 1 dose of MMR if you were born in 1957 or later. You may also need a 2nd dose. For females of childbearing age, rubella immunity should be determined. If there is no evidence of immunity, females who are not pregnant should be vaccinated. If there is no evidence of immunity, females who are pregnant should delay immunization until after pregnancy.  Pneumococcal 13-valent conjugate (PCV13) vaccine.** / Consult your health care provider.  Pneumococcal polysaccharide (PPSV23) vaccine.** / 1 to 2 doses if you smoke cigarettes or if you have certain conditions.  Meningococcal vaccine.** / Consult your health care provider.  Hepatitis A vaccine.** / Consult your health care provider.  Hepatitis B vaccine.** / Consult your health care provider.  Haemophilus influenzae type b (Hib) vaccine.** / Consult your health care provider. Ages 65   years and over  Blood pressure check.** / Every 1 to 2 years.  Lipid and cholesterol check.** / Every 5 years beginning at age 22 years.  Lung cancer screening. / Every year if you are aged 73-80 years and have a 30-pack-year history of smoking and currently smoke or have quit within the past 15 years. Yearly screening is stopped once you have quit smoking for at least 15 years or develop a health problem that would prevent you from having lung cancer  treatment.  Clinical breast exam.** / Every year after age 4 years.  BRCA-related cancer risk assessment.** / For women who have family members with a BRCA-related cancer (breast, ovarian, tubal, or peritoneal cancers).  Mammogram.** / Every year beginning at age 40 years and continuing for as long as you are in good health. Consult with your health care provider.  Pap test.** / Every 3 years starting at age 9 years through age 34 or 91 years with 3 consecutive normal Pap tests. Testing can be stopped between 65 and 70 years with 3 consecutive normal Pap tests and no abnormal Pap or HPV tests in the past 10 years.  HPV screening.** / Every 3 years from ages 57 years through ages 64 or 45 years with a history of 3 consecutive normal Pap tests. Testing can be stopped between 65 and 70 years with 3 consecutive normal Pap tests and no abnormal Pap or HPV tests in the past 10 years.  Fecal occult blood test (FOBT) of stool. / Every year beginning at age 15 years and continuing until age 17 years. You may not need to do this test if you get a colonoscopy every 10 years.  Flexible sigmoidoscopy or colonoscopy.** / Every 5 years for a flexible sigmoidoscopy or every 10 years for a colonoscopy beginning at age 86 years and continuing until age 71 years.  Hepatitis C blood test.** / For all people born from [birth year] through 1965 and any individual with known risks for hepatitis C.  Osteoporosis screening.** / A one-time screening for women ages 83 years and over and women at risk for fractures or osteoporosis.  Skin self-exam. / Monthly.  Influenza vaccine. / Every year.  Tetanus, diphtheria, and acellular pertussis (Tdap/Td) vaccine.** / 1 dose of Td every 10 years.  Varicella vaccine.** / Consult your health care provider.  Zoster vaccine.** / 1 dose for adults aged 61 years or older.  Pneumococcal 13-valent conjugate (PCV13) vaccine.** / Consult your health care provider.  Pneumococcal  polysaccharide (PPSV23) vaccine.** / 1 dose for all adults aged 28 years and older.  Meningococcal vaccine.** / Consult your health care provider.  Hepatitis A vaccine.** / Consult your health care provider.  Hepatitis B vaccine.** / Consult your health care provider.  Haemophilus influenzae type b (Hib) vaccine.** / Consult your health care provider. ** Family history and personal history of risk and conditions may change your health care provider's recommendations. Document Released: 12/28/2001 Document Revised: 03/18/2014 Document Reviewed: 03/29/2011 [facility] Patient Information 2015 [place], [place]. This information is not intended to replace advice given to you by your health care provider. Make sure you discuss any questions you have with your health care provider.

## 2015-03-14 NOTE — Progress Notes (Signed)
    GYNECOLOGY CLINIC NOTE     David StallJasmine L Matsumura is a 29 y.o. [id number] female here for a routine annual gynecologic exam and Depo Provera  Current complaints: had flutter in stomach, clinic UPT here negative.  Also had negative clinic UPT on 02/21/15 and negative serum HCG on same day.  Still feels that she is pregnant.  Offered to do repeat serum HCG but she refused, wants doppler evaluation or ultrasound. She was told this will not be done with negative pregnancy test so far.  Patient then refused to undergo her annual exam today or get her Depo Provera injection. [facility] " [facility] will do what I need".  Jaynie CollinsUGONNA  ANYANWU, MD, FACOG Attending Obstetrician & Gynecologist Center for [facility], [facility]

## 2015-03-14 NOTE — Progress Notes (Signed)
Patient ID: Amy Mcfarland, female   DOB: [birth year], 29 y.o.   MRN: [id number] Pt states she feels fluttering in stomach, pregnancy test negative.

## 2015-03-17 ENCOUNTER — Other Ambulatory Visit: Payer: Medicaid Other

## 2015-03-21 ENCOUNTER — Encounter (HOSPITAL_COMMUNITY): Payer: Self-pay | Admitting: Emergency Medicine

## 2015-03-21 ENCOUNTER — Emergency Department (HOSPITAL_COMMUNITY): Payer: Medicaid Other

## 2015-03-21 ENCOUNTER — Emergency Department (HOSPITAL_COMMUNITY)
Admission: EM | Admit: 2015-03-21 | Discharge: 2015-03-21 | Disposition: A | Discharge status: Home / Self Care | Payer: Medicaid Other | Attending: Emergency Medicine | Admitting: Emergency Medicine

## 2015-03-21 DIAGNOSIS — Z87448 Personal history of other diseases of urinary system: Secondary | ICD-10-CM | POA: Diagnosis not present

## 2015-03-21 DIAGNOSIS — Y999 Unspecified external cause status: Secondary | ICD-10-CM | POA: Diagnosis not present

## 2015-03-21 DIAGNOSIS — W1830XA Fall on same level, unspecified, initial encounter: Secondary | ICD-10-CM | POA: Insufficient documentation

## 2015-03-21 DIAGNOSIS — Y929 Unspecified place or not applicable: Secondary | ICD-10-CM | POA: Insufficient documentation

## 2015-03-21 DIAGNOSIS — S63601A Unspecified sprain of right thumb, initial encounter: Secondary | ICD-10-CM | POA: Insufficient documentation

## 2015-03-21 DIAGNOSIS — Y939 Activity, unspecified: Secondary | ICD-10-CM | POA: Insufficient documentation

## 2015-03-21 DIAGNOSIS — M79641 Pain in right hand: Secondary | ICD-10-CM | POA: Diagnosis present

## 2015-03-21 DIAGNOSIS — Z720 Tobacco use: Secondary | ICD-10-CM | POA: Diagnosis not present

## 2015-03-21 DIAGNOSIS — G43909 Migraine, unspecified, not intractable, without status migrainosus: Secondary | ICD-10-CM | POA: Diagnosis not present

## 2015-03-21 MED ORDER — IBUPROFEN 400 MG PO TABS
800.0000 mg | ORAL_TABLET | Freq: Once | ORAL | Status: AC
  Administered 2015-03-21: 800 mg via ORAL
  Filled 2015-03-21: qty 2

## 2015-03-21 MED ORDER — IBUPROFEN 800 MG PO TABS: 800.0000 mg | ORAL_TABLET | Freq: Three times a day (TID) | ORAL | Status: DC | PRN

## 2015-03-21 MED ORDER — TRAMADOL HCL 50 MG PO TABS: 50.0000 mg | ORAL_TABLET | Freq: Four times a day (QID) | ORAL | Status: DC | PRN

## 2015-03-21 NOTE — ED Notes (Signed)
Pt c/o right arm pain after falling today in hand near thumb area

## 2015-03-21 NOTE — Discharge Instructions (Signed)
Return here as needed.  Follow-up with the hand doctor as needed.  Ice and elevate the thumb

## 2015-03-21 NOTE — ED Provider Notes (Signed)
CSN: [id number]     Arrival date & time 03/21/15  1647 History  This chart was scribed for non-physician practitioner Amy Ridgehris Mayci Haning, PA, working with Amy OctaveStephen Rancour, MD, by Amy Mcfarland, ED Scribe. This patient was seen in room TR06C/TR06C and the patient's care was started at 5:15 PM.    Chief Complaint  Patient presents with  . Hand Pain   The history is provided by the patient. No language interpreter was used.     HPI Comments: Amy Mcfarland is a 29 y.o. female who presents to the Emergency Department complaining of right hand pain s/p ground level fall that occurred earlier today. She describes the pain as a sharp sensation. Pt reports that she fell today and tried to catch herself with her left hand, causing the pain. The pain is mainly located in her thumb on the palmar aspect. Pt denies any other symptoms.    Past Medical History  Diagnosis Date  . Migraine   . Miscarriage   . Kidney infection   . Migraines   . Kidney infection    Past Surgical History  Procedure Laterality Date  . Cesarean section  2011  . Cesarean section    . Cesarean section     Family History  Problem Relation Age of Onset  . Asthma Maternal Uncle   . Cancer Maternal Grandmother     breast cancer  . Cancer Paternal Grandmother   . Diabetes Mother   . Hypertension Father   . Diabetes Father    History  Substance Use Topics  . Smoking status: Current Some Day Smoker -- 0.25 packs/day for 7 years    Types: Cigarettes  . Smokeless tobacco: Never Used  . Alcohol Use: Yes     Comment: occasionally   OB History    Gravida Para Term Preterm AB TAB SAB Ectopic Multiple Living   2 1 1  1  1   1       Obstetric Comments   LMP 12/25/14     Review of Systems  A complete 10 system review of systems was obtained and all systems are negative except as noted in the HPI and PMH.    Allergies  Kiwi extract; Peach; Strawberry; and Vicodin  Home Medications   Prior to Admission medications    Medication Sig Start Date End Date Taking? Authorizing Provider  ibuprofen (ADVIL,MOTRIN) 800 MG tablet Take 1 tablet (800 mg total) by mouth every 8 (eight) hours as needed. 03/09/15   Amy RingsErin J Honig, MD  medroxyPROGESTERone (DEPO-PROVERA) 150 MG/ML injection Inject 150 mg into the muscle every 3 (three) months.    Historical Provider, MD  SUMAtriptan (IMITREX) 25 MG tablet Take 25 mg by mouth every 2 (two) hours as needed for migraine. May repeat in 2 hours if headache persists or recurs.    Historical Provider, MD   Triage Vitals: BP 114/70 mmHg  Pulse 93  Temp(Src) 99.1 F (37.3 C) (Oral)  Resp 16  SpO2 100%  LMP 01/14/2015   Physical Exam  Constitutional: She is oriented to person, place, and time. She appears well-developed and well-nourished. No distress.  HENT:  Head: Normocephalic and atraumatic.  Eyes: Conjunctivae and EOM are normal. Pupils are equal, round, and reactive to light.  Neck: Neck supple. No tracheal deviation present.  Cardiovascular: Normal rate, regular rhythm and normal heart sounds.   Pulmonary/Chest: Effort normal and breath sounds normal. No respiratory distress. She has no wheezes. She has no rales.  Musculoskeletal: Normal range  of motion.  Generalized pain on palpation of right thumb and on base of right thumb. No snuffbox tenderness. No wrist tenderness.   Neurological: She is alert and oriented to person, place, and time.  Skin: Skin is warm and dry.  Psychiatric: She has a normal mood and affect. Her behavior is normal.  Nursing note and vitals reviewed.   ED Course  Procedures (including critical care time)  DIAGNOSTIC STUDIES: Oxygen Saturation is 100% on RA, normal by my interpretation.    COORDINATION OF CARE: 5:18 PM-Discussed treatment plan which includes pain medication with pt at bedside and pt agreed to plan.   Labs Review Labs Reviewed - No data to display  Imaging Review Dg Finger Thumb Right  03/21/2015   CLINICAL DATA:  Right  thumb pain, swelling and tingling after falling this morning and trying to grab onto the kitchen table with her right thumb.  EXAM: RIGHT THUMB 2+V  COMPARISON:  02/25/2014.  FINDINGS: There is no evidence of fracture or dislocation. There is no evidence of arthropathy or other focal bone abnormality. Soft tissues are unremarkable  IMPRESSION: Normal examination.   Electronically Signed   By: Beckie SaltsSteven  Reid M.D.   On: 03/21/2015 18:28    I personally performed the services described in this documentation, which was scribed in my presence. The recorded information has been reviewed and is accurate.      Amy Nighthristopher Aaliyha Mumford, PA-C 03/23/15 0035  Amy JesterKathleen McManus, DO 03/23/15 1649

## 2015-03-25 ENCOUNTER — Encounter (HOSPITAL_COMMUNITY): Payer: Self-pay | Admitting: Emergency Medicine

## 2015-03-25 ENCOUNTER — Emergency Department (HOSPITAL_COMMUNITY)
Admission: EM | Admit: 2015-03-25 | Discharge: 2015-03-25 | Disposition: A | Discharge status: Home / Self Care | Payer: Medicaid Other | Attending: Emergency Medicine | Admitting: Emergency Medicine

## 2015-03-25 DIAGNOSIS — K088 Other specified disorders of teeth and supporting structures: Secondary | ICD-10-CM | POA: Insufficient documentation

## 2015-03-25 DIAGNOSIS — Z720 Tobacco use: Secondary | ICD-10-CM | POA: Insufficient documentation

## 2015-03-25 DIAGNOSIS — G43909 Migraine, unspecified, not intractable, without status migrainosus: Secondary | ICD-10-CM | POA: Insufficient documentation

## 2015-03-25 DIAGNOSIS — Z87448 Personal history of other diseases of urinary system: Secondary | ICD-10-CM | POA: Diagnosis not present

## 2015-03-25 DIAGNOSIS — K0381 Cracked tooth: Secondary | ICD-10-CM | POA: Diagnosis not present

## 2015-03-25 DIAGNOSIS — K0889 Other specified disorders of teeth and supporting structures: Secondary | ICD-10-CM

## 2015-03-25 MED ORDER — MELOXICAM 15 MG PO TABS: 15.0000 mg | ORAL_TABLET | Freq: Every day | ORAL | Status: DC

## 2015-03-25 MED ORDER — PENICILLIN V POTASSIUM 500 MG PO TABS: 500.0000 mg | ORAL_TABLET | Freq: Four times a day (QID) | ORAL | Status: DC

## 2015-03-25 NOTE — ED Notes (Signed)
Pt refused prescriptions because she was not getting Percocet. She was encouraged to take PCN BUT REFUSED.

## 2015-03-25 NOTE — ED Provider Notes (Signed)
CSN: [id number]     Arrival date & time 03/25/15  1043 History  This chart was scribed for [name], PA-C working with Bethann BerkshireJoseph Zammit, MD by Evon Slackerrance Branch, ED Scribe. This patient was seen in room TR04C/TR04C and the patient's care was started at 11:09 AM.      Chief Complaint  Patient presents with  . Dental Pain   The history is provided by the patient. No language interpreter was used.   HPI Comments: Amy Mcfarland is a 29 y.o. female who presents to the Emergency Department complaining of left lower dental pain onset 2 days prior. Pt states she believes the filling may have come out. Pt states she has appointment to get the tooth pulled on 04/03/15. Pt states that the pain is worse when eating. Pt states she wento to urgent care on 03/09/15 where she was precscibed amoxicillin, ibuprofen and percocet that has provided relief up until 2 days ago. Pt denies fever, trouble swallowing or other related symptoms.   Past Medical History  Diagnosis Date  . Migraine   . Miscarriage   . Kidney infection   . Migraines   . Kidney infection    Past Surgical History  Procedure Laterality Date  . Cesarean section  2011  . Cesarean section    . Cesarean section     Family History  Problem Relation Age of Onset  . Asthma Maternal Uncle   . Cancer Maternal Grandmother     breast cancer  . Cancer Paternal Grandmother   . Diabetes Mother   . Hypertension Father   . Diabetes Father    History  Substance Use Topics  . Smoking status: Current Some Day Smoker -- 0.25 packs/day for 7 years    Types: Cigarettes  . Smokeless tobacco: Never Used  . Alcohol Use: Yes     Comment: occasionally   OB History    Gravida Para Term Preterm AB TAB SAB Ectopic Multiple Living   2 1 1  1  1   1       Obstetric Comments   LMP 12/25/14      Review of Systems  Constitutional: Negative for fever.  HENT: Positive for dental problem. Negative for trouble swallowing.   All other systems  reviewed and are negative.    Allergies  Kiwi extract; Peach; Strawberry; and Vicodin  Home Medications   Prior to Admission medications   Medication Sig Start Date End Date Taking? Authorizing Provider  ibuprofen (ADVIL,MOTRIN) 800 MG tablet Take 1 tablet (800 mg total) by mouth every 8 (eight) hours as needed. 03/21/15  Yes Christopher Lawyer, PA-C  traMADol (ULTRAM) 50 MG tablet Take 1 tablet (50 mg total) by mouth every 6 (six) hours as needed. 03/21/15  Yes Charlestine Nighthristopher Lawyer, PA-C  medroxyPROGESTERone (DEPO-PROVERA) 150 MG/ML injection Inject 150 mg into the muscle every 3 (three) months.    Historical Provider, MD  SUMAtriptan (IMITREX) 25 MG tablet Take 25 mg by mouth every 2 (two) hours as needed for migraine. May repeat in 2 hours if headache persists or recurs.    Historical Provider, MD   BP 116/64 mmHg  Pulse 66  Temp(Src) 98.3 F (36.8 C) (Oral)  Resp 18  SpO2 100%  LMP 01/14/2015   Physical Exam  Constitutional: She is oriented to person, place, and time. She appears well-developed and well-nourished. No distress.  HENT:  Head: Normocephalic and atraumatic.  Mouth/Throat: No dental abscesses.  Poor detention. Left lower 2nd molar cracked and tender  to percussion. No dental abscess noted.   Eyes: Conjunctivae and EOM are normal.  Neck: Neck supple. No tracheal deviation present.  Cardiovascular: Normal rate.   Pulmonary/Chest: Effort normal. No respiratory distress.  Abdominal: Soft. She exhibits no distension. There is no tenderness. There is no rebound.  Musculoskeletal: Normal range of motion.  Neurological: She is alert and oriented to person, place, and time.  Skin: Skin is warm and dry.  Psychiatric: She has a normal mood and affect. Her behavior is normal.  Nursing note and vitals reviewed.   ED Course  Procedures (including critical care time) DIAGNOSTIC STUDIES: Oxygen Saturation is 100% on RA, normal by my interpretation.    COORDINATION OF  CARE: 11:57 AM-Discussed treatment plan with pt at bedside and pt agreed to plan.     Labs Review Labs Reviewed - No data to display  Imaging Review No results found.   EKG Interpretation None      MDM   Final diagnoses:  Pain, dental    No signs of ludwigs angina. Patient will have naprosyn and Veetid for pain. Patient advised to follow up with her dentist.   I personally performed the services described in this documentation, which was scribed in my presence. The recorded information has been reviewed and is accurate.      Emilia BeckKaitlyn Waco Foerster, PA-C 03/26/15 1110  Bethann BerkshireJoseph Zammit, MD 03/26/15 1452

## 2015-03-25 NOTE — ED Notes (Signed)
Pt started 2 days ago with left upper dental pain. States "I think my filling came out".

## 2015-03-25 NOTE — Discharge Instructions (Signed)
Take veetid as directed until gone. Take mobic as needed for pain. Follow up with your dentist. Refer to attached documents for more information.

## 2015-03-27 ENCOUNTER — Emergency Department (INDEPENDENT_AMBULATORY_CARE_PROVIDER_SITE_OTHER)
Admission: EM | Admit: 2015-03-27 | Discharge: 2015-03-27 | Disposition: A | Discharge status: Home / Self Care | Payer: Medicaid Other | Source: Home / Self Care | Attending: Family Medicine | Admitting: Family Medicine

## 2015-03-27 ENCOUNTER — Encounter (HOSPITAL_COMMUNITY): Payer: Self-pay | Admitting: Emergency Medicine

## 2015-03-27 DIAGNOSIS — K0889 Other specified disorders of teeth and supporting structures: Secondary | ICD-10-CM

## 2015-03-27 DIAGNOSIS — K088 Other specified disorders of teeth and supporting structures: Secondary | ICD-10-CM

## 2015-03-27 MED ORDER — AMOXICILLIN 500 MG PO CAPS: 500.0000 mg | ORAL_CAPSULE | Freq: Three times a day (TID) | ORAL | Status: DC

## 2015-03-27 MED ORDER — NAPROXEN 375 MG PO TABS: 375.0000 mg | ORAL_TABLET | Freq: Two times a day (BID) | ORAL | Status: DC

## 2015-03-27 NOTE — Discharge Instructions (Signed)
Thank you for coming in today. Follow up with your dentist.  Return as needed.  We will not prescribe narcotic type pain medicines for this pain at this location.    Dental Pain A tooth ache may be caused by cavities (tooth decay). Cavities expose the nerve of the tooth to air and hot or cold temperatures. It may come from an infection or abscess (also called a boil or furuncle) around your tooth. It is also often caused by dental caries (tooth decay). This causes the pain you are having. DIAGNOSIS  Your caregiver can diagnose this problem by exam. TREATMENT   If caused by an infection, it may be treated with medications which kill germs (antibiotics) and pain medications as prescribed by your caregiver. Take medications as directed.  Only take over-the-counter or prescription medicines for pain, discomfort, or fever as directed by your caregiver.  Whether the tooth ache today is caused by infection or dental disease, you should see your dentist as soon as possible for further care. SEEK MEDICAL CARE IF: The exam and treatment you received today has been provided on an emergency basis only. This is not a substitute for complete medical or dental care. If your problem worsens or new problems (symptoms) appear, and you are unable to meet with your dentist, call or return to this location. SEEK IMMEDIATE MEDICAL CARE IF:   You have a fever.  You develop redness and swelling of your face, jaw, or neck.  You are unable to open your mouth.  You have severe pain uncontrolled by pain medicine. MAKE SURE YOU:   Understand these instructions.  Will watch your condition.  Will get help right away if you are not doing well or get worse. Document Released: 11/01/2005 Document Revised: 01/24/2012 Document Reviewed: 06/19/2008 [facility] Patient Information 2015 [place]. This information is not intended to replace advice given to you by your health care provider. Make sure you discuss any  questions you have with your health care provider.

## 2015-03-27 NOTE — ED Provider Notes (Signed)
Amy StallJasmine L Hoganson is a 29 y.o. female who presents to Urgent Care today for dental pain. Patient has chronic dental pain located in the upper left mouth. Pain has been present for several months. She's had multiple different visits to different providers for medications. She states that she has an appointment with her dentist on the 19th to have extraction. No fevers or chills nausea vomiting or diarrhea.   Past Medical History  Diagnosis Date  . Migraine   . Miscarriage   . Kidney infection   . Migraines   . Kidney infection    Past Surgical History  Procedure Laterality Date  . Cesarean section  2011  . Cesarean section    . Cesarean section     History  Substance Use Topics  . Smoking status: Current Some Day Smoker -- 0.25 packs/day for 7 years    Types: Cigarettes  . Smokeless tobacco: Never Used  . Alcohol Use: Yes     Comment: occasionally   ROS as above Medications: No current facility-administered medications for this encounter.   Current Outpatient Prescriptions  Medication Sig Dispense Refill  . SUMAtriptan (IMITREX) 25 MG tablet Take 25 mg by mouth every 2 (two) hours as needed for migraine. May repeat in 2 hours if headache persists or recurs.    Marland Kitchen. amoxicillin (AMOXIL) 500 MG capsule Take 1 capsule (500 mg total) by mouth 3 (three) times daily. 21 capsule 0  . ibuprofen (ADVIL,MOTRIN) 800 MG tablet Take 1 tablet (800 mg total) by mouth every 8 (eight) hours as needed. 21 tablet 0  . medroxyPROGESTERone (DEPO-PROVERA) 150 MG/ML injection Inject 150 mg into the muscle every 3 (three) months.    . naproxen (NAPROSYN) 375 MG tablet Take 1 tablet (375 mg total) by mouth 2 (two) times daily. 20 tablet 0   Allergies  Allergen Reactions  . Kiwi Extract Other (See Comments)    Makes mouth feel hairy  . Peach [Prunus Persica] Other (See Comments)    Makes mouth feel hairy  . Strawberry Other (See Comments)    Makes mouth feel like it has hair in it  . Vicodin  [Hydrocodone-Acetaminophen] Swelling     Exam:  BP 99/66 mmHg  Pulse 78  Temp(Src) 97.9 F (36.6 C) (Oral)  Resp 20  SpO2 96%  LMP 01/14/2015 Gen: Well NAD HEENT: EOMI,  MMM poor dentition throughout with multiple dental caries and eroded teeth. Gumline is erythematous. Tender palpation. No facial swelling Lungs: Normal work of breathing. CTABL Heart: RRR no MRG Exts: Brisk capillary refill, warm and well perfused.   No results found for this or any previous visit (from the past 24 hour(s)). No results found.  Assessment and Plan: 29 y.o. female wDental pain. I offered and patient declined a dental injection today. Patient has had multiple different narcotic prescriptions prescribed recently for this problem. I will no longer prescribe narcotics for this problem. She needs to follow-up with her dizziness. Treat with amoxicillin and naproxen.  iscussed warning signs or symptoms. Please see discharge instructions. Patient expresses understanding.     Rodolph BongEvan S Heywood Tokunaga, MD 03/27/15 [phone]

## 2015-03-27 NOTE — ED Notes (Signed)
Reports top left dental pain with mild swelling and irritation.   No relief with oral gel and ibuprofen.

## 2015-06-04 ENCOUNTER — Emergency Department (HOSPITAL_COMMUNITY)
Admission: EM | Admit: 2015-06-04 | Discharge: 2015-06-04 | Disposition: A | Discharge status: Home / Self Care | Payer: Medicaid Other | Attending: Emergency Medicine | Admitting: Emergency Medicine

## 2015-06-04 ENCOUNTER — Encounter (HOSPITAL_COMMUNITY): Payer: Self-pay | Admitting: *Deleted

## 2015-06-04 DIAGNOSIS — R350 Frequency of micturition: Secondary | ICD-10-CM | POA: Insufficient documentation

## 2015-06-04 DIAGNOSIS — Z3202 Encounter for pregnancy test, result negative: Secondary | ICD-10-CM | POA: Diagnosis not present

## 2015-06-04 DIAGNOSIS — R112 Nausea with vomiting, unspecified: Secondary | ICD-10-CM | POA: Insufficient documentation

## 2015-06-04 DIAGNOSIS — Z720 Tobacco use: Secondary | ICD-10-CM | POA: Diagnosis not present

## 2015-06-04 DIAGNOSIS — G43909 Migraine, unspecified, not intractable, without status migrainosus: Secondary | ICD-10-CM | POA: Diagnosis not present

## 2015-06-04 DIAGNOSIS — Z87448 Personal history of other diseases of urinary system: Secondary | ICD-10-CM | POA: Diagnosis not present

## 2015-06-04 DIAGNOSIS — R109 Unspecified abdominal pain: Secondary | ICD-10-CM

## 2015-06-04 DIAGNOSIS — Z79899 Other long term (current) drug therapy: Secondary | ICD-10-CM | POA: Diagnosis not present

## 2015-06-04 LAB — COMPREHENSIVE METABOLIC PANEL
ALK PHOS: 60 U/L (ref 38–126)
ALT: 19 U/L (ref 14–54)
AST: 18 U/L (ref 15–41)
Albumin: 3.7 g/dL (ref 3.5–5.0)
Anion gap: 7 (ref 5–15)
BILIRUBIN TOTAL: 0.7 mg/dL (ref 0.3–1.2)
BUN: 6 mg/dL (ref 6–20)
CALCIUM: 8.9 mg/dL (ref 8.9–10.3)
CO2: 22 mmol/L (ref 22–32)
Chloride: 106 mmol/L (ref 101–111)
Creatinine, Ser: 0.86 mg/dL (ref 0.44–1.00)
GFR calc non Af Amer: >60 mL/min (ref >60)
GLUCOSE: 105 mg/dL — AB (ref 65–99)
POTASSIUM: 3.7 mmol/L (ref 3.5–5.1)
SODIUM: 135 mmol/L (ref 135–145)
Total Protein: 7.4 g/dL (ref 6.5–8.1)

## 2015-06-04 LAB — URINALYSIS, ROUTINE W REFLEX MICROSCOPIC
Bilirubin Urine: NEGATIVE
Glucose, UA: NEGATIVE mg/dL
Hgb urine dipstick: NEGATIVE
KETONES UR: NEGATIVE mg/dL
NITRITE: NEGATIVE
PH: 7.5 (ref 5.0–8.0)
Protein, ur: NEGATIVE mg/dL
SPECIFIC GRAVITY, URINE: 1.019 (ref 1.005–1.030)
Urobilinogen, UA: 1 mg/dL (ref 0.0–1.0)

## 2015-06-04 LAB — CBC
HCT: 41.5 % (ref 36.0–46.0)
HEMOGLOBIN: 14 g/dL (ref 12.0–15.0)
MCH: 29.9 pg (ref 26.0–34.0)
MCHC: 33.7 g/dL (ref 30.0–36.0)
MCV: 88.7 fL (ref 78.0–100.0)
Platelets: 245 10*3/uL (ref 150–400)
RBC: 4.68 MIL/uL (ref 3.87–5.11)
RDW: 14.4 % (ref 11.5–15.5)
WBC: 9.4 10*3/uL (ref 4.0–10.5)

## 2015-06-04 LAB — LIPASE, BLOOD: Lipase: 21 U/L — ABNORMAL LOW (ref 22–51)

## 2015-06-04 LAB — URINE MICROSCOPIC-ADD ON

## 2015-06-04 LAB — PREGNANCY, URINE: PREG TEST UR: NEGATIVE

## 2015-06-04 MED ORDER — METRONIDAZOLE 500 MG PO TABS: 500.0000 mg | ORAL_TABLET | Freq: Two times a day (BID) | ORAL | Status: DC

## 2015-06-04 NOTE — Discharge Instructions (Signed)
Abdominal Pain, Women °Abdominal (stomach, pelvic, or belly) pain can be caused by many things. It is important to tell your doctor: °· The location of the pain. °· Does it come and go or is it present all the time? °· Are there things that start the pain (eating certain foods, exercise)? °· Are there other symptoms associated with the pain (fever, nausea, vomiting, diarrhea)? °All of this is helpful to know when trying to find the cause of the pain. °CAUSES  °· Stomach: virus or bacteria infection, or ulcer. °· Intestine: appendicitis (inflamed appendix), regional ileitis (Crohn's disease), ulcerative colitis (inflamed colon), irritable bowel syndrome, diverticulitis (inflamed diverticulum of the colon), or cancer of the stomach or intestine. °· Gallbladder disease or stones in the gallbladder. °· Kidney disease, kidney stones, or infection. °· Pancreas infection or cancer. °· Fibromyalgia (pain disorder). °· Diseases of the female organs: °¨ Uterus: fibroid (non-cancerous) tumors or infection. °¨ Fallopian tubes: infection or tubal pregnancy. °¨ Ovary: cysts or tumors. °¨ Pelvic adhesions (scar tissue). °¨ Endometriosis (uterus lining tissue growing in the pelvis and on the pelvic organs). °¨ Pelvic congestion syndrome (female organs filling up with blood just before the menstrual period). °¨ Pain with the menstrual period. °¨ Pain with ovulation (producing an egg). °¨ Pain with an IUD (intrauterine device, birth control) in the uterus. °¨ Cancer of the female organs. °· Functional pain (pain not caused by a disease, may improve without treatment). °· Psychological pain. °· Depression. °DIAGNOSIS  °Your doctor will decide the seriousness of your pain by doing an examination. °· Blood tests. °· X-rays. °· Ultrasound. °· CT scan (computed tomography, special type of X-ray). °· MRI (magnetic resonance imaging). °· Cultures, for infection. °· Barium enema (dye inserted in the large intestine, to better view it with  X-rays). °· Colonoscopy (looking in intestine with a lighted tube). °· Laparoscopy (minor surgery, looking in abdomen with a lighted tube). °· Major abdominal exploratory surgery (looking in abdomen with a large incision). °TREATMENT  °The treatment will depend on the cause of the pain.  °· Many cases can be observed and treated at home. °· Over-the-counter medicines recommended by your caregiver. °· Prescription medicine. °· Antibiotics, for infection. °· Birth control pills, for painful periods or for ovulation pain. °· Hormone treatment, for endometriosis. °· Nerve blocking injections. °· Physical therapy. °· Antidepressants. °· Counseling with a psychologist or psychiatrist. °· Minor or major surgery. °HOME CARE INSTRUCTIONS  °· Do not take laxatives, unless directed by your caregiver. °· Take over-the-counter pain medicine only if ordered by your caregiver. Do not take aspirin because it can cause an upset stomach or bleeding. °· Try a clear liquid diet (broth or water) as ordered by your caregiver. Slowly move to a bland diet, as tolerated, if the pain is related to the stomach or intestine. °· Have a thermometer and take your temperature several times a day, and record it. °· Bed rest and sleep, if it helps the pain. °· Avoid sexual intercourse, if it causes pain. °· Avoid stressful situations. °· Keep your follow-up appointments and tests, as your caregiver orders. °· If the pain does not go away with medicine or surgery, you may try: °¨ Acupuncture. °¨ Relaxation exercises (yoga, meditation). °¨ Group therapy. °¨ Counseling. °SEEK MEDICAL CARE IF:  °· You notice certain foods cause stomach pain. °· Your home care treatment is not helping your pain. °· You need stronger pain medicine. °· You want your IUD removed. °· You feel faint or   lightheaded. °· You develop nausea and vomiting. °· You develop a rash. °· You are having side effects or an allergy to your medicine. °SEEK IMMEDIATE MEDICAL CARE IF:  °· Your  pain does not go away or gets worse. °· You have a fever. °· Your pain is felt only in portions of the abdomen. The right side could possibly be appendicitis. The left lower portion of the abdomen could be colitis or diverticulitis. °· You are passing blood in your stools (bright red or black tarry stools, with or without vomiting). °· You have blood in your urine. °· You develop chills, with or without a fever. °· You pass out. °MAKE SURE YOU:  °· Understand these instructions. °· Will watch your condition. °· Will get help right away if you are not doing well or get worse. °Document Released: 08/29/2007 Document Revised: 03/18/2014 Document Reviewed: 09/18/2009 °ExitCare® Patient Information ©2015 [employer]. This information is not intended to replace advice given to you by your health care provider. Make sure you discuss any questions you have with your health care provider. ° °

## 2015-06-04 NOTE — ED Provider Notes (Signed)
CSN: [id number]     Arrival date & time 06/04/15  1433 History   First MD Initiated Contact with Patient 06/04/15 1443     Chief Complaint  Patient presents with  . Abdominal Pain  . Possible Pregnancy     (Consider location/radiation/quality/duration/timing/severity/associated sxs/prior Treatment) HPI Amy Mcfarland is a 29 yo AA female presenting abdominal pain and concern about possibility of being pregnant.  States that she has had 2 days of vague abdominal discomfort and reports feeling movement in her abdomen.  Reports that she has tried taking Tylenol and ibuprofen for this discomfort but tried nothing as of today.  She reports that her method of birth control is Depo shots every 3 months but has not had one since January.  Otherwise, still reports condom use.  However, reports condom broke during intercourse 3 months ago.  States that was the last time being sexually active with her partner.  LMP was 2 months ago.  Reports nausea, vomiting, increased urinary frequency.  Denies any vaginal discharge, hematuria, lower extremity edema. Past Medical History  Diagnosis Date  . Migraine   . Miscarriage   . Kidney infection   . Migraines   . Kidney infection    Past Surgical History  Procedure Laterality Date  . Cesarean section  2011  . Cesarean section    . Cesarean section     Family History  Problem Relation Age of Onset  . Asthma Maternal Uncle   . Cancer Maternal Grandmother     breast cancer  . Cancer Paternal Grandmother   . Diabetes Mother   . Hypertension Father   . Diabetes Father    History  Substance Use Topics  . Smoking status: Current Some Day Smoker -- 0.25 packs/day for 7 years    Types: Cigarettes  . Smokeless tobacco: Never Used  . Alcohol Use: Yes     Comment: occasionally   OB History    Gravida Para Term Preterm AB TAB SAB Ectopic Multiple Living   Obstetric Comments   LMP 12/25/14     Review of Systems  Constitutional:  Negative for fever and chills.  Respiratory: Negative for cough and shortness of breath.   Cardiovascular: Negative for chest pain, palpitations and leg swelling.  Gastrointestinal: Positive for nausea and vomiting. Negative for abdominal pain and diarrhea.  Genitourinary: Positive for frequency. Negative for dysuria, hematuria, vaginal bleeding and vaginal discharge.      Allergies  Kiwi extract; Peach; Strawberry; and Vicodin  Home Medications   Prior to Admission medications   Medication Sig Start Date End Date Taking? Authorizing Provider  acetaminophen (TYLENOL) 500 MG tablet Take 1,000 mg by mouth every 6 (six) hours as needed for mild pain or moderate pain.   Yes Historical Provider, MD  ibuprofen (ADVIL,MOTRIN) 200 MG tablet Take 800 mg by mouth every 6 (six) hours as needed for mild pain or moderate pain.   Yes Historical Provider, MD  SUMAtriptan (IMITREX) 25 MG tablet Take 25 mg by mouth every 2 (two) hours as needed for migraine. May repeat in 2 hours if headache persists or recurs.   Yes Historical Provider, MD  metroNIDAZOLE (FLAGYL) 500 MG tablet Take 1 tablet (500 mg total) by mouth 2 (two) times daily. 06/04/15   Gwynn Burly, DO   BP 127/75 mmHg  Pulse 84  Temp(Src) 98.4 F (36.9 C) (Oral)  Resp 20  SpO2 100% Physical  Exam  Constitutional: She is oriented to person, place, and time. She appears well-developed and well-nourished.  HENT:  Head: Normocephalic and atraumatic.  Eyes: EOM are normal.  Cardiovascular: Normal rate, regular rhythm and intact distal pulses.   No lower extremity edema  Pulmonary/Chest: Effort normal and breath sounds normal. She has no wheezes. She has no rales.  Abdominal: Soft. Bowel sounds are normal. She exhibits no distension. There is no tenderness.  Neurological: She is alert and oriented to person, place, and time.  Skin: Skin is warm and intact.  Psychiatric: She has a normal mood and affect. Her speech is normal and behavior is  normal.    ED Course  Procedures (including critical care time) Labs Review Labs Reviewed  LIPASE, BLOOD - Abnormal; Notable for the following:    Lipase 21 (*)    All other components within normal limits  COMPREHENSIVE METABOLIC PANEL - Abnormal; Notable for the following:    Glucose, Bld 105 (*)    All other components within normal limits  URINALYSIS, ROUTINE W REFLEX MICROSCOPIC (NOT [facility]) - Abnormal; Notable for the following:    APPearance HAZY (*)    Leukocytes, UA SMALL (*)    All other components within normal limits  URINE MICROSCOPIC-ADD ON - Abnormal; Notable for the following:    Squamous Epithelial / LPF MANY (*)    Bacteria, UA MANY (*)    All other components within normal limits  CBC  PREGNANCY, URINE    Imaging Review No results found.   EKG Interpretation None      MDM   Final diagnoses:  Abdominal pain in female   29 yo female with vague abdominal pain and concern of being pregnant.  Urine pregnancy test negative, CBC without signs of infection.  Urinalysis with small amount leukocytes, negative nitrites, many squamous epithelial cells, mucus present with trichomonas present.  Will treat trichomonas with metronidazole 500 mg bid x 7 days.      Gwynn BurlyAndrew Josearmando Kuhnert, DO 06/04/15 1611  Zadie Rhineonald Wickline, MD 06/04/15 [phone]

## 2015-06-04 NOTE — ED Notes (Signed)
Walked into room.  Pt was dressed and had removed all monitoring devices.  Refused to reapply BP cuff and pulse ox.

## 2015-06-04 NOTE — ED Notes (Signed)
Pt reports generalized abdominal pain. Denies vaginal discharge/bleeding.  Pt states that she has not had a period in 2 months. Pt states that she feels a "movement".

## 2015-07-07 ENCOUNTER — Encounter: Payer: Self-pay | Admitting: Sports Medicine

## 2015-07-07 ENCOUNTER — Ambulatory Visit (INDEPENDENT_AMBULATORY_CARE_PROVIDER_SITE_OTHER): Payer: Medicaid Other | Admitting: Sports Medicine

## 2015-07-07 VITALS — BP 105/78 | Ht 66.0 in | Wt 230.0 lb

## 2015-07-07 DIAGNOSIS — M25511 Pain in right shoulder: Secondary | ICD-10-CM

## 2015-07-07 MED ORDER — TRAMADOL HCL 50 MG PO TABS: 50.0000 mg | ORAL_TABLET | Freq: Three times a day (TID) | ORAL | Status: DC | PRN

## 2015-07-07 NOTE — Progress Notes (Signed)
Patient ID: Amy Mcfarland, female   DOB: 07-12-1986, 29 y.o.   MRN: [id number]  HPI: [name] is a 29 year old female presenting with a 1.5 month history of R shoulder/arm pain after a fall.  The fall resulted in her falling directly on her shoulder, noting immediate pain.  Pain improved with ibuprofen and Percocet she had left from a prior dental procedure.  Has been taking acetaminophen and ibuprofen in the interim with pain relief during the day, but returning and awakening her at night.  Pain is dull in quality and located over the glenohumeral joint.  She is able to fully abduct but guards due to pain, also noting R trapezius tension due to guarding.  Lifting her young son causes sharp pain in the same area.  Denies neck pain or paraesthesias.  Subjective weakness with various, unspecified tasks.  Filed Vitals:   07/07/15 1335  BP: 105/78   Physical exam:  General: Female in 20s sitting upright on exam table, holding R arm; in no acute distress.  Upper extremities: Full ROM, significant guarding on R side. Normal tone and bulk of shoulder musculature with localized spasm of R trapezius. Clavicles symmetrical, no tenderness with palpation along structure. No widening of subacromial space. 5/5 strength biceps, 4/5 strength internal/external rotation at elbow on R. R arm: Positive lift off test. O'Brien's positive. Positive clunk. Empty can, Hawkins, Speeds negative.  Endorses "clicking" noise with drop arm test (otherwise negative). Pinpoint tenderness over glenohumeral joint.  BR reflexes symmetrical and sensation to light touch intact bilaterally.  Assessment: [name] is a 29 year old female presenting with a 1.5 month history of traumatic R shoulder/arm pain with 4/5 strength internal/external rotation at R elbow, positive lift off test, pinpoint tenderness over glenohumeral joint, and "clicking" noise with with drop arm test.   Differential at this time includes  rotator cuff tear/tendinopathy, labral tear, shoulder dislocation, clavicle fracture, and AC ligament tear.  Suspect this is either rotator cuff tear/tendinopathy due to minor subscapularis, teres minor, and infraspinatus weakness on exam or a labral tear due to "clicking" on exam and fall history.  Less suspicious for latter differential items due to otherwise symmetrical anatomical exam.  Will proceed with imaging to further differentiate etiology as discussed above.  Plan: - Discussed with patient differential items and course of diagnosis - will have R shoulder AP, lateral and axillary R films and MR arthrogram. - Tramadol 50 mg TID PRN for pain, encouraged to use sparingly and continue alternating NSAIDS - Follow-up after imaging for re-evaluation of rotator cuff vs. Labral etiology and therapeutic plan.  Of note, patient has 3 covered visits with [place].  [place] No,   Patient seen and evaluated with the above medical student. I agree with the plan of care. Patient suffered an injury to her right shoulder due to a fall 6 weeks ago. History and physical exam findings are concerning for significant labral tear. I will get x-rays and an MRI arthrogram to evaluate and she will follow-up with me after those studies to delineate definitive treatment.

## 2015-07-08 ENCOUNTER — Encounter (HOSPITAL_COMMUNITY): Payer: Self-pay | Admitting: *Deleted

## 2015-07-08 ENCOUNTER — Emergency Department (HOSPITAL_COMMUNITY)
Admission: EM | Admit: 2015-07-08 | Discharge: 2015-07-08 | Disposition: A | Discharge status: Home / Self Care | Payer: Medicaid Other | Attending: Emergency Medicine | Admitting: Emergency Medicine

## 2015-07-08 DIAGNOSIS — R3915 Urgency of urination: Secondary | ICD-10-CM

## 2015-07-08 DIAGNOSIS — Z8679 Personal history of other diseases of the circulatory system: Secondary | ICD-10-CM | POA: Insufficient documentation

## 2015-07-08 DIAGNOSIS — Z79899 Other long term (current) drug therapy: Secondary | ICD-10-CM | POA: Insufficient documentation

## 2015-07-08 DIAGNOSIS — N390 Urinary tract infection, site not specified: Secondary | ICD-10-CM | POA: Diagnosis not present

## 2015-07-08 DIAGNOSIS — Z720 Tobacco use: Secondary | ICD-10-CM | POA: Diagnosis not present

## 2015-07-08 DIAGNOSIS — R350 Frequency of micturition: Secondary | ICD-10-CM | POA: Diagnosis present

## 2015-07-08 LAB — URINALYSIS, ROUTINE W REFLEX MICROSCOPIC
BILIRUBIN URINE: NEGATIVE
Glucose, UA: NEGATIVE mg/dL
Ketones, ur: NEGATIVE mg/dL
NITRITE: NEGATIVE
PH: 6.5 (ref 5.0–8.0)
Protein, ur: 30 mg/dL — AB
SPECIFIC GRAVITY, URINE: 1.018 (ref 1.005–1.030)
Urobilinogen, UA: 1 mg/dL (ref 0.0–1.0)

## 2015-07-08 LAB — URINE MICROSCOPIC-ADD ON

## 2015-07-08 MED ORDER — SULFAMETHOXAZOLE-TRIMETHOPRIM 800-160 MG PO TABS: 1.0000 | ORAL_TABLET | Freq: Two times a day (BID) | ORAL | Status: DC

## 2015-07-08 NOTE — ED Notes (Signed)
Pt states that she has had urinary frequency and strong smelling urine. Denies burning or pain with urination.

## 2015-07-08 NOTE — ED Provider Notes (Signed)
CSN: [id number]     Arrival date & time 07/08/15  4098 History   First MD Initiated Contact with Patient 07/08/15 1022     Chief Complaint  Patient presents with  . Urinary Frequency     (Consider location/radiation/quality/duration/timing/severity/associated sxs/prior Treatment) HPI Comments: Amy Mcfarland is a 29 y.o. female with a PMHx of migraines, pyelonephritis, and miscarriage, who presents to the ED with complaints of 3 days of increased urinary frequency and urgency, and malodorous urine. She has had UTIs in the past and this feels similar. She denies any fevers, chills, chest pain, shortness breath, abdominal pain, nausea, vomiting, diarrhea, constipation, dysuria, hematuria, vaginal bleeding or discharge, vaginal itching, numbness, tingling, weakness, or flank pain. She is on Depot shots therefore she does not have a menstrual cycle. She has not been sexually active in the last 2 months. [facility]. She recently had a negative Upreg here and at her doctor's office before her depot shot.  Patient is a 29 y.o. female presenting with frequency. The history is provided by the patient. No language interpreter was used.  Urinary Frequency This is a recurrent problem. The current episode started in the past 7 days. The problem occurs constantly. The problem has been unchanged. Associated symptoms include urinary symptoms. Pertinent negatives include no abdominal pain, arthralgias, chest pain, chills, fever, myalgias, nausea, numbness, vomiting or weakness. Nothing aggravates the symptoms. She has tried nothing for the symptoms. The treatment provided no relief.    Past Medical History  Diagnosis Date  . Migraine   . Miscarriage   . Kidney infection   . Migraines   . Kidney infection    Past Surgical History  Procedure Laterality Date  . Cesarean section  2011  . Cesarean section    . Cesarean section     Family  History  Problem Relation Age of Onset  . Asthma Maternal Uncle   . Cancer Maternal Grandmother     breast cancer  . Cancer Paternal Grandmother   . Diabetes Mother   . Hypertension Father   . Diabetes Father    Social History  Substance Use Topics  . Smoking status: Current Some Day Smoker -- 0.25 packs/day for 7 years    Types: Cigarettes  . Smokeless tobacco: Never Used  . Alcohol Use: Yes     Comment: occasionally   OB History    Gravida Para Term Preterm AB TAB SAB Ectopic Multiple Living   2 1 1  1  1   1       Obstetric Comments   LMP 12/25/14     Review of Systems  Constitutional: Negative for fever and chills.  Respiratory: Negative for shortness of breath.   Cardiovascular: Negative for chest pain.  Gastrointestinal: Negative for nausea, vomiting, abdominal pain, diarrhea and constipation.  Genitourinary: Positive for urgency and frequency. Negative for dysuria, hematuria, flank pain, vaginal bleeding, vaginal discharge and vaginal pain.       +malodorous urine  Musculoskeletal: Negative for myalgias and arthralgias.  Skin: Negative for color change.  Allergic/Immunologic: Negative for immunocompromised state.  Neurological: Negative for weakness and numbness.  Psychiatric/Behavioral: Negative for confusion.    10 Systems reviewed and are negative for acute change except as noted in the HPI.   Allergies  Kiwi extract; Peach; Strawberry; and Vicodin  Home Medications   Prior to Admission medications   Medication Sig Start Date End Date Taking? Authorizing Provider  acetaminophen (TYLENOL) 500 MG tablet Take 1,000 mg by mouth every 6 (six) hours as needed for mild pain or moderate pain.    Historical Provider, MD  ibuprofen (ADVIL,MOTRIN) 200 MG tablet Take 800 mg by mouth every 6 (six) hours as needed for mild pain or moderate pain.    Historical Provider, MD  metroNIDAZOLE (FLAGYL) 500 MG tablet Take 1 tablet (500 mg total) by mouth 2 (two) times daily.  06/04/15   Gwynn Burly, DO  SUMAtriptan (IMITREX) 25 MG tablet Take 25 mg by mouth every 2 (two) hours as needed for migraine. May repeat in 2 hours if headache persists or recurs.    Historical Provider, MD  traMADol (ULTRAM) 50 MG tablet Take 1 tablet (50 mg total) by mouth every 8 (eight) hours as needed. 07/07/15   Ozzie Hoyle Draper, DO   BP 122/71 mmHg  Pulse 70  Temp(Src) 98.6 F (37 C) (Oral)  Resp 16  SpO2 100% Physical Exam  Constitutional: She is oriented to person, place, and time. Vital signs are normal. She appears well-developed and well-nourished.  Non-toxic appearance. No distress.  Afebrile, nontoxic, NAD  HENT:  Head: Normocephalic and atraumatic.  Mouth/Throat: Oropharynx is clear and moist and mucous membranes are normal.  Eyes: Conjunctivae and EOM are normal. Right eye exhibits no discharge. Left eye exhibits no discharge.  Neck: Normal range of motion. Neck supple.  Cardiovascular: Normal rate, regular rhythm, normal heart sounds and intact distal pulses.  Exam reveals no gallop and no friction rub.   No murmur heard. Pulmonary/Chest: Effort normal and breath sounds normal. No respiratory distress. She has no decreased breath sounds. She has no wheezes. She has no rhonchi. She has no rales.  Abdominal: Soft. Normal appearance and bowel sounds are normal. She exhibits no distension. There is no tenderness. There is no rigidity, no rebound, no guarding, no CVA tenderness, no tenderness at McBurney's point and negative Murphy's sign.  Soft, NTND, +BS throughout, no r/g/r, neg murphy's, neg mcburney's, no CVA TTP   Musculoskeletal: Normal range of motion.  Neurological: She is alert and oriented to person, place, and time. She has normal strength. No sensory deficit.  Skin: Skin is warm, dry and intact. No rash noted.  Psychiatric: She has a normal mood and affect.  Nursing note and vitals reviewed.   ED Course  Procedures (including critical care time) Labs  Review Labs Reviewed  URINALYSIS, ROUTINE W REFLEX MICROSCOPIC (NOT AT [facility]) - Abnormal; Notable for the following:    Color, Urine AMBER (*)    APPearance TURBID (*)    Hgb urine dipstick LARGE (*)    Protein, ur 30 (*)    Leukocytes, UA LARGE (*)    All other components within normal limits  URINE MICROSCOPIC-ADD ON - Abnormal; Notable for the following:    Bacteria, UA MANY (*)    All other components within normal limits    Imaging Review No results found. I have personally reviewed and evaluated these images and lab results as part of my medical decision-making.   EKG Interpretation None      MDM   Final diagnoses:  UTI (lower urinary tract infection)  Urinary frequency  Urgency of urination    29 y.o. female here with urinary freq/malodorous urine, no dysuria, no abd pain, no vaginal complaints. U/A with TNTC WBC, many bacteria, large leuks, consistent with UTI. No flank pain/CVA TTP, doubt pyelo. No pelvic complaints, doubt need for pelvic exam. Will treat with bactrim and  have her f/up with PCP in 1wk. I explained the diagnosis and have given explicit precautions to return to the ER including for any other new or worsening symptoms. The patient understands and accepts the medical plan as it's been dictated and I have answered their questions. Discharge instructions concerning home care and prescriptions have been given. The patient is STABLE and is discharged to home in good condition.  BP 122/71 mmHg  Pulse 70  Temp(Src) 98.6 F (37 C) (Oral)  Resp 16  SpO2 100%  Meds ordered this encounter  Medications  . sulfamethoxazole-trimethoprim (BACTRIM DS,SEPTRA DS) 800-160 MG per tablet    Sig: Take 1 tablet by mouth 2 (two) times daily.    Dispense:  14 tablet    Refill:  0    Order Specific Question:  Supervising Provider    Answer:  Eber Hong [[id number]]       Gracelynn Bircher Camprubi-Soms, PA-C 07/08/15 1043  Melene Plan, DO 07/08/15 1551

## 2015-07-08 NOTE — Discharge Instructions (Signed)
Stay very well hydrated with plenty of water throughout the day. Take antibiotic until completed. Follow up with primary care physician in 1 week for recheck of ongoing symptoms but return to ER for emergent changing or worsening of symptoms. °Please seek immediate care if you develop the following: °You develop back pain.  °Your symptoms are no better, or worse in 3 days. °There is severe back pain or lower abdominal pain.  °You develop chills.  °You have a fever.  °There is nausea or vomiting.  °There is continued burning or discomfort with urination.   ° ° °Urinary Tract Infection °A urinary tract infection (UTI) can occur any place along the urinary tract. The tract includes the kidneys, ureters, bladder, and urethra. A type of germ called bacteria often causes a UTI. UTIs are often helped with antibiotic medicine.  °HOME CARE  °· If given, take antibiotics as told by your doctor. Finish them even if you start to feel better. °· Drink enough fluids to keep your pee (urine) clear or pale yellow. °· Avoid tea, drinks with caffeine, and bubbly (carbonated) drinks. °· Pee often. Avoid holding your pee in for a long time. °· Pee before and after having sex (intercourse). °· Wipe from front to back after you poop (bowel movement) if you are a woman. Use each tissue only once. °GET HELP RIGHT AWAY IF:  °· You have back pain. °· You have lower belly (abdominal) pain. °· You have chills. °· You feel sick to your stomach (nauseous). °· You throw up (vomit). °· Your burning or discomfort with peeing does not go away. °· You have a fever. °· Your symptoms are not better in 3 days. °MAKE SURE YOU:  °· Understand these instructions. °· Will watch your condition. °· Will get help right away if you are not doing well or get worse. °Document Released: 04/19/2008 Document Revised: 07/26/2012 Document Reviewed: 06/01/2012 °ExitCare® Patient Information ©2015 [employer]. This information is not intended to replace advice given  to you by your health care provider. Make sure you discuss any questions you have with your health care provider. ° °

## 2015-07-09 ENCOUNTER — Ambulatory Visit (INDEPENDENT_AMBULATORY_CARE_PROVIDER_SITE_OTHER): Payer: Medicaid Other | Admitting: *Deleted

## 2015-07-09 DIAGNOSIS — Z30013 Encounter for initial prescription of injectable contraceptive: Secondary | ICD-10-CM | POA: Diagnosis present

## 2015-07-09 DIAGNOSIS — Z3042 Encounter for surveillance of injectable contraceptive: Secondary | ICD-10-CM

## 2015-07-09 DIAGNOSIS — Z3202 Encounter for pregnancy test, result negative: Secondary | ICD-10-CM

## 2015-07-09 DIAGNOSIS — Z30018 Encounter for initial prescription of other contraceptives: Secondary | ICD-10-CM

## 2015-07-09 LAB — POCT URINE PREGNANCY: PREG TEST UR: NEGATIVE

## 2015-07-09 MED ORDER — MEDROXYPROGESTERONE ACETATE 150 MG/ML IM SUSP
150.0000 mg | Freq: Once | INTRAMUSCULAR | Status: AC
  Administered 2015-07-09: 150 mg via INTRAMUSCULAR

## 2015-07-09 NOTE — Progress Notes (Signed)
Pt received her depo-provera on 07/09/2015 left glu. She tolerated this injection well. Pregnancy test was negative. Pt to returned around 09/24/2015-10/08/2015.

## 2015-07-15 ENCOUNTER — Inpatient Hospital Stay: Admission: RE | Admit: 2015-07-15 | Payer: Medicaid Other | Source: Ambulatory Visit

## 2015-08-01 ENCOUNTER — Telehealth: Payer: Self-pay | Admitting: *Deleted

## 2015-08-01 NOTE — Telephone Encounter (Signed)
Spoke to pt about when she will get her xray and mri.  She mentioned getting the xray soon.  Told her to call me once its performed so that we can get the pre auth and schedule her MRI again.

## 2015-08-06 ENCOUNTER — Ambulatory Visit: Payer: Medicaid Other | Admitting: Sports Medicine

## 2015-11-13 ENCOUNTER — Encounter (HOSPITAL_COMMUNITY): Payer: Self-pay | Admitting: Cardiology

## 2015-11-13 ENCOUNTER — Emergency Department (HOSPITAL_COMMUNITY): Payer: Medicaid Other

## 2015-11-13 ENCOUNTER — Emergency Department (HOSPITAL_COMMUNITY)
Admission: EM | Admit: 2015-11-13 | Discharge: 2015-11-13 | Disposition: A | Discharge status: Home / Self Care | Payer: Medicaid Other | Attending: Emergency Medicine | Admitting: Emergency Medicine

## 2015-11-13 DIAGNOSIS — Y9389 Activity, other specified: Secondary | ICD-10-CM | POA: Diagnosis not present

## 2015-11-13 DIAGNOSIS — Y998 Other external cause status: Secondary | ICD-10-CM | POA: Diagnosis not present

## 2015-11-13 DIAGNOSIS — W2209XA Striking against other stationary object, initial encounter: Secondary | ICD-10-CM | POA: Insufficient documentation

## 2015-11-13 DIAGNOSIS — Z792 Long term (current) use of antibiotics: Secondary | ICD-10-CM | POA: Insufficient documentation

## 2015-11-13 DIAGNOSIS — F17210 Nicotine dependence, cigarettes, uncomplicated: Secondary | ICD-10-CM | POA: Diagnosis not present

## 2015-11-13 DIAGNOSIS — S92912A Unspecified fracture of left toe(s), initial encounter for closed fracture: Secondary | ICD-10-CM

## 2015-11-13 DIAGNOSIS — Y9289 Other specified places as the place of occurrence of the external cause: Secondary | ICD-10-CM | POA: Diagnosis not present

## 2015-11-13 DIAGNOSIS — G43909 Migraine, unspecified, not intractable, without status migrainosus: Secondary | ICD-10-CM | POA: Insufficient documentation

## 2015-11-13 DIAGNOSIS — S99922A Unspecified injury of left foot, initial encounter: Secondary | ICD-10-CM | POA: Diagnosis present

## 2015-11-13 DIAGNOSIS — S92515A Nondisplaced fracture of proximal phalanx of left lesser toe(s), initial encounter for closed fracture: Secondary | ICD-10-CM | POA: Diagnosis not present

## 2015-11-13 DIAGNOSIS — Z87448 Personal history of other diseases of urinary system: Secondary | ICD-10-CM | POA: Insufficient documentation

## 2015-11-13 MED ORDER — IBUPROFEN 600 MG PO TABS: 600.0000 mg | ORAL_TABLET | Freq: Four times a day (QID) | ORAL | Status: DC | PRN

## 2015-11-13 MED ORDER — OXYCODONE-ACETAMINOPHEN 5-325 MG PO TABS
1.0000 | ORAL_TABLET | Freq: Once | ORAL | Status: AC
Start: 2015-11-13 — End: 2015-11-13
  Administered 2015-11-13: 1 via ORAL
  Filled 2015-11-13: qty 1

## 2015-11-13 MED ORDER — OXYCODONE-ACETAMINOPHEN 5-325 MG PO TABS: 1.0000 | ORAL_TABLET | ORAL | Status: DC | PRN

## 2015-11-13 NOTE — ED Notes (Signed)
Reports left foot pain, thinks she may have broken her toes.

## 2015-11-13 NOTE — ED Provider Notes (Signed)
CSN: [id number]     Arrival date & time 11/13/15  1652 History  By signing my name below, I, Amy Mcfarland, attest that this documentation has been prepared under the direction and in the presence of [employer], PA-C. Electronically Signed: Angelene Giovanni, ED Scribe. 11/13/2015. 7:16 PM.      Chief Complaint  Patient presents with  . Foot Pain   The history is provided by the patient. No language interpreter was used.   HPI Comments: Amy Mcfarland is a 29 y.o. female who presents to the Emergency Department complaining of gradually worsneing 9/10 left foot pain s/p foot injury that occurred approx. 3 hours ago. She reports associated numbness in little toes and mild swelling. She explains that she was playing with her son when she accidentally kicked the wall with her left foot. She denies taking any medications for the pain. Pt is not currently on any blood thinners. She denies any ankle pain or knee pain.    Past Medical History  Diagnosis Date  . Migraine   . Miscarriage   . Kidney infection   . Migraines   . Kidney infection    Past Surgical History  Procedure Laterality Date  . Cesarean section  2011  . Cesarean section    . Cesarean section     Family History  Problem Relation Age of Onset  . Asthma Maternal Uncle   . Cancer Maternal Grandmother     breast cancer  . Cancer Paternal Grandmother   . Diabetes Mother   . Hypertension Father   . Diabetes Father    Social History  Substance Use Topics  . Smoking status: Current Some Day Smoker -- 0.25 packs/day for 7 years    Types: Cigarettes  . Smokeless tobacco: Never Used  . Alcohol Use: Yes     Comment: occasionally   OB History    Gravida Para Term Preterm AB TAB SAB Ectopic Multiple Living   Obstetric Comments   LMP 12/25/14     Review of Systems  Constitutional: Negative for fever.  Musculoskeletal: Positive for joint swelling (mild) and arthralgias (left foot pain).   Skin: Negative for wound.  Neurological: Positive for numbness (of left little toe).  All other systems reviewed and are negative.     Allergies  Kiwi extract; Peach; Strawberry extract; and Vicodin  Home Medications   Prior to Admission medications   Medication Sig Start Date End Date Taking? Authorizing Provider  acetaminophen (TYLENOL) 500 MG tablet Take 1,000 mg by mouth every 6 (six) hours as needed for mild pain or moderate pain.    Historical Provider, MD  ibuprofen (ADVIL,MOTRIN) 600 MG tablet Take 1 tablet (600 mg total) by mouth every 6 (six) hours as needed. 11/13/15   Joycie Peek, PA-C  metroNIDAZOLE (FLAGYL) 500 MG tablet Take 1 tablet (500 mg total) by mouth 2 (two) times daily. 06/04/15   Gwynn Burly, DO  oxyCODONE-acetaminophen (PERCOCET/ROXICET) 5-325 MG tablet Take 1-2 tablets by mouth every 4 (four) hours as needed for severe pain. 11/13/15   Joycie Peek, PA-C  sulfamethoxazole-trimethoprim (BACTRIM DS,SEPTRA DS) 800-160 MG per tablet Take 1 tablet by mouth 2 (two) times daily. 07/08/15   Mercedes Camprubi-Soms, PA-C  SUMAtriptan (IMITREX) 25 MG tablet Take 25 mg by mouth every 2 (two) hours as needed for migraine. May repeat in 2 hours if headache persists or recurs.    Historical Provider,  MD  traMADol (ULTRAM) 50 MG tablet Take 1 tablet (50 mg total) by mouth every 8 (eight) hours as needed. 07/07/15   Ozzie Hoyleimothy R Draper, DO   BP 136/82 mmHg  Pulse 80  Temp(Src) 98.5 F (36.9 C) (Oral)  Resp 16  SpO2 100% Physical Exam  Constitutional: She is oriented to person, place, and time. She appears well-developed and well-nourished. No distress.  HENT:  Head: Normocephalic and atraumatic.  Eyes: Conjunctivae and EOM are normal.  Neck: Neck supple. No tracheal deviation present.  Cardiovascular: Normal rate, regular rhythm and normal heart sounds.   Pulmonary/Chest: Effort normal. No respiratory distress.  Lungs are clear to auscultation bilaterally    Musculoskeletal: Normal range of motion.  Full active ROM of LLE and larger extremities  Diffuse tenderness to pinky toe.  Sensation intact over dorsum of left pinky toe, mild diffuse swelling, compartments are soft. No erythema. Distal pulses intact.   Neurological: She is alert and oriented to person, place, and time.  Skin: Skin is warm and dry.  Psychiatric: She has a normal mood and affect. Her behavior is normal.  Nursing note and vitals reviewed.   ED Course  Procedures (including critical care time) DIAGNOSTIC STUDIES: Oxygen Saturation is 97% on RA, normal by my interpretation.    COORDINATION OF CARE: 6:46 PM- Pt advised of plan for treatment and pt agrees. Explained that if toe is not broken then will recommend alternating Tylenol and Motrin. She will receive pain medication if toe is broken.    Imaging Review Dg Foot Complete Left  11/13/2015  CLINICAL DATA:  29 year old female with left foot trauma and pain. EXAM: LEFT FOOT - COMPLETE 3+ VIEW COMPARISON:  None. FINDINGS: There is nondisplaced fracture of the proximal phalanx of the fifth digit. There is mild soft tissue swelling of the fifth toe and forefoot. No other fracture identified. No radiopaque foreign object noted. IMPRESSION: Nondisplaced fracture of the proximal phalanx of the fifth toe. Electronically Signed   By: Elgie CollardArash  Radparvar M.D.   On: 11/13/2015 18:49     Joycie PeekBenjamin Omran Keelin, PA-C has personally reviewed and evaluated these images as part of his medical decision-making.  Meds given in ED:  Medications  oxyCODONE-acetaminophen (PERCOCET/ROXICET) 5-325 MG per tablet 1 tablet (1 tablet Oral Given 11/13/15 1908)    Discharge Medication List as of 11/13/2015  7:15 PM    START taking these medications   Details  oxyCODONE-acetaminophen (PERCOCET/ROXICET) 5-325 MG tablet Take 1-2 tablets by mouth every 4 (four) hours as needed for severe pain., Starting 11/13/2015, Until Discontinued, Print       Filed  Vitals:   11/13/15 1740 11/13/15 1908  BP: 130/63 136/82  Pulse: 77 80  Temp: 98.5 F (36.9 C)   TempSrc: Oral   Resp: 18 16  SpO2: 97% 100%    MDM  Patient X-Ray shows nondisplaced fracture proximal left fifth phalanx. Pain managed in ED. Given a postop shoe. Pt advised to follow up with orthopedics if symptoms persist. Also given [facility] to establish PCP care and follow-up. Discharged with prescription for oxycodone for break through pain, reports tongue swelling allergy to hydrocodone. Narcotic medication precautions given. Discussed [place] and conservative therapy at home. Patient will be dc home & is agreeable with above plan.  Final diagnoses:  Toe fracture, left, closed, initial encounter   I personally performed the services described in this documentation, which was scribed in my presence. The recorded information has been reviewed and is  accurate.   Joycie Peek, PA-C 11/13/15 Ernestina Columbia  Margarita Grizzle, MD 11/15/15 [phone]

## 2015-11-13 NOTE — ED Notes (Signed)
See PAs notes for assessments. 

## 2015-11-13 NOTE — Discharge Instructions (Signed)
You were evaluated in the ED today for your foot pain. You were found to have a fracture/broken left pinky toe. This should heal on its own over 6-8 weeks. Follow-up with community health [facility] wellness to establish primary care and for further evaluation and management of your symptoms. Take your pain medicine as we discussed for severe pain. Continue with Tylenol and Motrin for moderate pain. Return to ED for any new or worsening symptoms as we discussed.

## 2015-11-13 NOTE — ED Notes (Signed)
Pt A&OX4, ambulatory at d/c with steady gait, NAD and states she has all of her belongings with her at d/c 

## 2015-11-26 ENCOUNTER — Emergency Department (HOSPITAL_COMMUNITY)
Admission: EM | Admit: 2015-11-26 | Discharge: 2015-11-26 | Disposition: A | Discharge status: Home / Self Care | Payer: Medicaid Other | Attending: Emergency Medicine | Admitting: Emergency Medicine

## 2015-11-26 ENCOUNTER — Encounter (HOSPITAL_COMMUNITY): Payer: Self-pay

## 2015-11-26 DIAGNOSIS — G43909 Migraine, unspecified, not intractable, without status migrainosus: Secondary | ICD-10-CM | POA: Diagnosis not present

## 2015-11-26 DIAGNOSIS — Z87448 Personal history of other diseases of urinary system: Secondary | ICD-10-CM | POA: Insufficient documentation

## 2015-11-26 DIAGNOSIS — J020 Streptococcal pharyngitis: Secondary | ICD-10-CM

## 2015-11-26 DIAGNOSIS — F17210 Nicotine dependence, cigarettes, uncomplicated: Secondary | ICD-10-CM | POA: Insufficient documentation

## 2015-11-26 DIAGNOSIS — Z792 Long term (current) use of antibiotics: Secondary | ICD-10-CM | POA: Diagnosis not present

## 2015-11-26 DIAGNOSIS — J029 Acute pharyngitis, unspecified: Secondary | ICD-10-CM | POA: Diagnosis present

## 2015-11-26 LAB — RAPID STREP SCREEN (MED CTR MEBANE ONLY): Streptococcus, Group A Screen (Direct): POSITIVE — AB

## 2015-11-26 MED ORDER — AMOXICILLIN 400 MG/5ML PO SUSR: 1000.0000 mg | Freq: Every day | ORAL | Status: DC

## 2015-11-26 MED ORDER — AMOXICILLIN 400 MG/5ML PO SUSR
1000.0000 mg | Freq: Every day | ORAL | Status: AC
Start: 2015-11-26 — End: 2015-12-06

## 2015-11-26 MED FILL — AMOXICILLIN 400 MG/5 ML SUS: 400 | 10 days supply | Qty: 200 | Fill #0

## 2015-11-26 NOTE — Discharge Instructions (Signed)
If you were given medicines take as directed.  If you are on coumadin or contraceptives realize their levels and effectiveness is altered by many different medicines.  If you have any reaction (rash, tongues swelling, other) to the medicines stop taking and see a physician.    If your blood pressure was elevated in the ER make sure you follow up for management with a primary doctor or return for chest pain, shortness of breath or stroke symptoms.  Please follow up as directed and return to the ER or see a physician for new or worsening symptoms.  Thank you. Filed Vitals:   11/26/15 0654  BP: 125/65  Pulse: 83  Temp: 98.9 F (37.2 C)  TempSrc: Oral  Resp: 18  Height: 5\' 6"  (1.676 m)  Weight: 267 lb (121.11 kg)  SpO2: 99%

## 2015-11-26 NOTE — ED Notes (Signed)
Sore throat for several days, reports getting worse and more swollen. Onset Saturday.

## 2015-11-26 NOTE — ED Notes (Signed)
See MD assessment. 

## 2015-11-29 NOTE — ED Provider Notes (Signed)
CSN: [id number]     Arrival date & time 11/26/15  [id number] History   First MD Initiated Contact with Patient 11/26/15 [phone]     Chief Complaint  Patient presents with  . Sore Throat     (Consider location/radiation/quality/duration/timing/severity/associated sxs/prior Treatment) HPI Comments: Sore throat for 3 days, no fever, tolerating po, sick contacts with similar. No strep hx.   Patient is a 30 y.o. female presenting with pharyngitis. The history is provided by the patient.  Sore Throat    Past Medical History  Diagnosis Date  . Migraine   . Miscarriage   . Kidney infection   . Migraines   . Kidney infection    Past Surgical History  Procedure Laterality Date  . Cesarean section  2011  . Cesarean section    . Cesarean section     Family History  Problem Relation Age of Onset  . Asthma Maternal Uncle   . Cancer Maternal Grandmother     breast cancer  . Cancer Paternal Grandmother   . Diabetes Mother   . Hypertension Father   . Diabetes Father    Social History  Substance Use Topics  . Smoking status: Current Some Day Smoker -- 0.25 packs/day for 7 years    Types: Cigarettes  . Smokeless tobacco: Never Used  . Alcohol Use: Yes     Comment: occasionally   OB History    Gravida Para Term Preterm AB TAB SAB Ectopic Multiple Living   2 1 1  1  1   1       Obstetric Comments   LMP 12/25/14     Review of Systems  HENT: Positive for congestion.   Respiratory: Negative for cough.       Allergies  Kiwi extract; Peach; Strawberry extract; and Vicodin  Home Medications   Prior to Admission medications   Medication Sig Start Date End Date Taking? Authorizing Provider  acetaminophen (TYLENOL) 500 MG tablet Take 1,000 mg by mouth every 6 (six) hours as needed for mild pain or moderate pain.    Historical Provider, MD  amoxicillin (AMOXIL) 400 MG/5ML suspension Take 12.5 mLs (1,000 mg total) by mouth daily. 11/26/15 12/06/15  Blane OharaJoshua Sohana Austell, MD  ibuprofen  (ADVIL,MOTRIN) 600 MG tablet Take 1 tablet (600 mg total) by mouth every 6 (six) hours as needed. 11/13/15   Joycie PeekBenjamin Cartner, PA-C  metroNIDAZOLE (FLAGYL) 500 MG tablet Take 1 tablet (500 mg total) by mouth 2 (two) times daily. 06/04/15   Gwynn BurlyAndrew Wallace, DO  oxyCODONE-acetaminophen (PERCOCET/ROXICET) 5-325 MG tablet Take 1-2 tablets by mouth every 4 (four) hours as needed for severe pain. 11/13/15   Joycie PeekBenjamin Cartner, PA-C  sulfamethoxazole-trimethoprim (BACTRIM DS,SEPTRA DS) 800-160 MG per tablet Take 1 tablet by mouth 2 (two) times daily. 07/08/15   Mercedes Camprubi-Soms, PA-C  SUMAtriptan (IMITREX) 25 MG tablet Take 25 mg by mouth every 2 (two) hours as needed for migraine. May repeat in 2 hours if headache persists or recurs.    Historical Provider, MD  traMADol (ULTRAM) 50 MG tablet Take 1 tablet (50 mg total) by mouth every 8 (eight) hours as needed. 07/07/15   Ozzie Hoyleimothy R Draper, DO   BP 125/65 mmHg  Pulse 83  Temp(Src) 98.9 F (37.2 C) (Oral)  Resp 18  Ht 5\' 6"  (1.676 m)  Wt 267 lb (121.11 kg)  BMI 43.12 kg/m2  SpO2 99% Physical Exam  Constitutional: She appears well-developed and well-nourished. No distress.  HENT:  Head: Normocephalic and atraumatic.  Posterior erythema, No  trismus, uvular deviation, unilateral posterior pharyngeal edema or submandibular swelling.   Cardiovascular: Normal rate.   Nursing note and vitals reviewed.   ED Course  Procedures (including critical care time) Labs Review Labs Reviewed  RAPID STREP SCREEN (NOT AT [facility]) - Abnormal; Notable for the following:    Streptococcus, Group A Screen (Direct) POSITIVE (*)    All other components within normal limits    Imaging Review No results found. I have personally reviewed and evaluated these images and lab results as part of my medical decision-making.   EKG Interpretation None      MDM   Final diagnoses:  Strep throat   Strep throat clinically.  No signs of abscess.   Results and  differential diagnosis were discussed with the patient/parent/guardian. Xrays were independently reviewed by myself.  Close follow up outpatient was discussed, comfortable with the plan.   Medications - No data to display  Filed Vitals:   11/26/15 0654  BP: 125/65  Pulse: 83  Temp: 98.9 F (37.2 C)  TempSrc: Oral  Resp: 18  Height: 5\' 6"  (1.676 m)  Weight: 267 lb (121.11 kg)  SpO2: 99%    Final diagnoses:  Strep throat        Blane Ohara, MD 11/29/15 1224

## 2016-03-02 ENCOUNTER — Inpatient Hospital Stay (HOSPITAL_COMMUNITY)
Admission: AD | Admit: 2016-03-02 | Discharge: 2016-03-02 | Disposition: A | Discharge status: Home / Self Care | Payer: Medicaid Other | Source: Ambulatory Visit | Attending: Family Medicine | Admitting: Family Medicine

## 2016-03-02 ENCOUNTER — Encounter (HOSPITAL_COMMUNITY): Payer: Self-pay | Admitting: *Deleted

## 2016-03-02 DIAGNOSIS — N911 Secondary amenorrhea: Secondary | ICD-10-CM | POA: Diagnosis not present

## 2016-03-02 DIAGNOSIS — Z3202 Encounter for pregnancy test, result negative: Secondary | ICD-10-CM | POA: Insufficient documentation

## 2016-03-02 DIAGNOSIS — R1013 Epigastric pain: Secondary | ICD-10-CM

## 2016-03-02 DIAGNOSIS — F17210 Nicotine dependence, cigarettes, uncomplicated: Secondary | ICD-10-CM | POA: Insufficient documentation

## 2016-03-02 DIAGNOSIS — Z6841 Body Mass Index (BMI) 40.0 and over, adult: Secondary | ICD-10-CM | POA: Insufficient documentation

## 2016-03-02 LAB — POCT PREGNANCY, URINE: PREG TEST UR: NEGATIVE

## 2016-03-02 LAB — URINALYSIS, ROUTINE W REFLEX MICROSCOPIC
BILIRUBIN URINE: NEGATIVE
Glucose, UA: NEGATIVE mg/dL
Hgb urine dipstick: NEGATIVE
Ketones, ur: NEGATIVE mg/dL
LEUKOCYTES UA: NEGATIVE
NITRITE: NEGATIVE
Protein, ur: NEGATIVE mg/dL
SPECIFIC GRAVITY, URINE: 1.015 (ref 1.005–1.030)
pH: 6.5 (ref 5.0–8.0)

## 2016-03-02 MED ORDER — MEDROXYPROGESTERONE ACETATE 10 MG PO TABS: 10.0000 mg | ORAL_TABLET | Freq: Every day | ORAL | Status: DC

## 2016-03-02 NOTE — MAU Note (Signed)
Pt states she took a pregnancy test last week and it was positive but she took another test this morning and it was negative.  She states last time this happened to her she trusted the negative test and found out later that she was pregnant and thus started prenatal care late.  Pt states her breast are tender.  Pt states she is having upper abdominal cramping.

## 2016-03-02 NOTE — Discharge Instructions (Signed)
Secondary Amenorrhea Secondary amenorrhea is the stopping of menstrual flow for 3-6 months in a female who has previously had periods. There are many possible causes. Most of these causes are not serious. Usually, treating the underlying problem causing the loss of menses will return your periods to normal. CAUSES  Some common and uncommon causes of not menstruating include:  Malnutrition.  Low blood sugar (hypoglycemia).  Polycystic ovary disease.  Stress or fear.  Breastfeeding.  Hormone imbalance.  Ovarian failure.  Medicines.  Extreme obesity.  Cystic fibrosis.  Low body weight or drastic weight reduction from any cause.  Early menopause.  Removal of ovaries or uterus.  Contraceptives.  Illness.  Long-term (chronic) illnesses.  Cushing syndrome.  Thyroid problems.  Birth control pills, patches, or vaginal rings for birth control. RISK FACTORS You may be at greater risk of secondary amenorrhea if:  You have a family history of this condition.  You have an eating disorder.  You do athletic training. DIAGNOSIS  A diagnosis is made by your health care provider taking a medical history and doing a physical exam. This will include a pelvic exam to check for problems with your reproductive organs. Pregnancy must be ruled out. Often, numerous blood tests are done to measure different hormones in the body. Urine testing may be done. Specialized exams (ultrasound, CT scan, MRI, or hysteroscopy) may have to be done as well as measuring the body mass index (BMI). TREATMENT  Treatment depends on the cause of the amenorrhea. If an eating disorder is present, this can be treated with an adequate diet and therapy. Chronic illnesses may improve with treatment of the illness. Amenorrhea may be corrected with medicines, lifestyle changes, or surgery. If the amenorrhea cannot be corrected, it is sometimes possible to create a false menstruation with medicines. HOME CARE  INSTRUCTIONS  Maintain a healthy diet.  Manage weight problems.  Exercise regularly but not excessively.  Get adequate sleep.  Manage stress.  Be aware of changes in your menstrual cycle. Keep a record of when your periods occur. Note the date your period starts, how long it lasts, and any problems. SEEK MEDICAL CARE IF: Your symptoms do not get better with treatment.   This information is not intended to replace advice given to you by your health care provider. Make sure you discuss any questions you have with your health care provider.   Document Released: 12/13/2006 Document Revised: 11/22/2014 Document Reviewed: 04/19/2013 Elsevier Interactive Patient Education 2016 [employer].  Pregnancy Test Information WHAT IS A PREGNANCY TEST? A pregnancy test is used to detect the presence of human chorionic gonadotropin (hCG) in a sample of your urine or blood. hCG is a hormone produced by the cells of the placenta. The placenta is the organ that forms to nourish and support a developing baby. This test requires a sample of either blood or urine. A pregnancy test determines whether you are pregnant or not. HOW ARE PREGNANCY TESTS DONE? Pregnancy tests are done using a home pregnancy test or having a blood or urine test done at your health care provider's office.  Home pregnancy tests require a urine sample.  Most kits use a plastic testing device with a strip of paper that indicates whether there is hCG in your urine.  Follow the test instructions very carefully.  After you urinate on the test stick, markings will appear to let you know whether you are pregnant.  For best results, use your first urine of the morning. That is when  the concentration of hCG is highest. Having a blood test to check for pregnancy requires a sample of blood drawn from a vein in your hand or arm. Your health care provider will send your sample to a lab for testing. Results of a pregnancy test will be  positive or negative. IS ONE TYPE OF PREGNANCY TEST BETTER THAN ANOTHER? In some cases, a blood test will return a positive result even if a urine test was negative because blood tests are more sensitive. This means blood tests can detect hCG earlier than home pregnancy tests.  HOW ACCURATE ARE HOME PREGNANCY TESTS?  Both types of pregnancy tests are very accurate.  A blood test is about 98% accurate.  When you are far enough along in your pregnancy and when used correctly, home pregnancy tests are equally accurate. CAN ANYTHING INTERFERE WITH HOME PREGNANCY TEST RESULTS?  It is possible for certain conditions to cause an inaccurate test result (false positive or falsenegative).  A false positive is a positive test result when you are not pregnant. This can happen if you:  Are taking certain medicines, including anticonvulsants or tranquilizers.  Have certain proteins in your blood.  A false negative is a negative test result when you are pregnant. This can happen if you:  Took the test before there was enough hCG to detect. A pregnancy test will not be positive in most women until 3-4 weeks after conception.  Drank a lot of liquid before the test. Diluted urine samples can sometimes give an inaccurate result.  Take certain medicines, such as water pills (diuretics) or some antihistamines. WHAT SHOULD I DO IF I HAVE A POSITIVE PREGNANCY TEST? If you have a positive pregnancy test, schedule an appointment with your health care provider. You might need additional testing to confirm the pregnancy. In the meantime, begin taking a prenatal vitamin, stop smoking, stop drinking alcohol, and do not use street drugs. Talk to your health care provider about how to take care of yourself during your pregnancy. Ask about what to expect from the care you will need throughout pregnancy (prenatal care).   This information is not intended to replace advice given to you by your health care provider. Make  sure you discuss any questions you have with your health care provider.   Document Released: 11/04/2003 Document Revised: 11/22/2014 Document Reviewed: 02/26/2014 Elsevier Interactive Patient Education [employer]

## 2016-03-02 NOTE — MAU Provider Note (Signed)
Chief Complaint: Nausea  First Provider Initiated Contact with Patient 03/02/16 [phone]     SUBJECTIVE HPI: Amy Mcfarland is a 30 y.o. G32P1011 female who presents to Maternity Admissions reporting concern for pregnancy. Reported neg home UPT to CNM. LMP 11/24/15. Had regular cycles before being on Depo for the past 4 years. Last Depo was 06/2015. Had ~ monthly cycles after that x 3.  Mild Upper abd pain and nausea x a few weeks that occurs after eating greasy food. None now. Also reports breast tenderness. No VB or low abd pain  Pt is sexually active using condoms (inconsistently?). Pt states that when she got pregnant in 2010 the test were never positive and she didn't know she was pregnant until 27 weeks. Worried about the same thing happening again. Records not available.   Location: Upper abd Quality: sharp Severity: 4/10 on pain scale at worst Duration: few weeks Context: after eating greasy food Timing: intermittent Modifying factors: Improves a while after eating. Hasn't needed to take anything for the pain. Associated signs and symptoms: Pos for nausea. Neg for vomiting, diarrhea  Past Medical History  Diagnosis Date  . Migraine   . Miscarriage   . Kidney infection   . Migraines   . Kidney infection    OB History  Gravida Para Term Preterm AB SAB TAB Ectopic Multiple Living  # Outcome Date GA Lbr Len/2nd Weight Sex Delivery Anes PTL Lv  2 Term 03/29/10     CS-LTranv     1 SAB             Obstetric Comments  LMP 11/24/15   Past Surgical History  Procedure Laterality Date  . Cesarean section  2011  . Cesarean section    . Cesarean section     Social History   Social History  . Marital Status: Single    Spouse Name: N/A  . Number of Children: N/A  . Years of Education: N/A   Occupational History  . Not on file.   Social History Main Topics  . Smoking status: Current Some Day Smoker -- 0.25 packs/day for 7 years    Types: Cigarettes  .  Smokeless tobacco: Never Used  . Alcohol Use: Yes     Comment: occasionally  . Drug Use: No  . Sexual Activity: Yes    Birth Control/ Protection: Condoms   Other Topics Concern  . Not on file   Social History Narrative   No current facility-administered medications on file prior to encounter.   Current Outpatient Prescriptions on File Prior to Encounter  Medication Sig Dispense Refill  . SUMAtriptan (IMITREX) 25 MG tablet Take 25 mg by mouth every 2 (two) hours as needed for migraine. May repeat in 2 hours if headache persists or recurs.    Marland Kitchen ibuprofen (ADVIL,MOTRIN) 600 MG tablet Take 1 tablet (600 mg total) by mouth every 6 (six) hours as needed. (Patient not taking: Reported on 03/02/2016) 30 tablet 0   Allergies  Allergen Reactions  . Kiwi Extract Other (See Comments)    Makes mouth feel hairy  . Peach [Prunus Persica] Other (See Comments)    Makes mouth feel hairy  . Strawberry Extract Other (See Comments)    Makes mouth feel like it has hair in it  . Vicodin [Hydrocodone-Acetaminophen] Swelling    I have reviewed the past Medical Hx, Surgical Hx, Social Hx, Allergies and Medications.   Review  of Systems  Constitutional: Negative for fever, chills and appetite change.  Gastrointestinal: Positive for nausea and abdominal pain. Negative for vomiting, diarrhea, constipation, blood in stool and abdominal distention.  Genitourinary: Positive for menstrual problem. Negative for dysuria and vaginal bleeding.  Skin:       Breast tenderness.     OBJECTIVE Patient Vitals for the past 24 hrs:  BP Temp Temp src Pulse Resp Height Weight  03/02/16 0910 130/68 mmHg 98.8 F (37.1 C) Oral 89 16 - -  03/02/16 0904 - - - - -  (1.702 m) 275 lb 12.8 oz (125.102 kg)   Constitutional: Well-developed, well-nourished, morbidly obese female in no acute distress.  Cardiovascular: normal rate Respiratory: normal rate and effort.  Neurologic: Alert and oriented x 4.  GU:  Declined  LAB RESULTS Results for orders placed or performed during the hospital encounter of 03/02/16 (from the past 24 hour(s))  Urinalysis, Routine w reflex microscopic (not at [facility])     Status: None   Collection Time: 03/02/16  9:12 AM  Result Value Ref Range   Color, Urine YELLOW YELLOW   APPearance CLEAR CLEAR   Specific Gravity, Urine 1.015 1.005 - 1.030   pH 6.5 5.0 - 8.0   Glucose, UA NEGATIVE NEGATIVE mg/dL   Hgb urine dipstick NEGATIVE NEGATIVE   Bilirubin Urine NEGATIVE NEGATIVE   Ketones, ur NEGATIVE NEGATIVE mg/dL   Protein, ur NEGATIVE NEGATIVE mg/dL   Nitrite NEGATIVE NEGATIVE   Leukocytes, UA NEGATIVE NEGATIVE  Pregnancy, urine POC     Status: None   Collection Time: 03/02/16  9:12 AM  Result Value Ref Range   Preg Test, Ur NEGATIVE NEGATIVE    IMAGING No results found.  MAU COURSE UPT neg. Pt reports pos then neg home UPT to RN, but only neg UPT to CNM. Discussed reasons for missing periods, how long it may take for UPT to be positive and that there is no indication for blood testing at this time w/out low abd pain or bleeding. Pt not asking for blood test. Recommend repeating UPT in two weeks, provera withdrawal bleed adn F/U in [facility] if still not having periods for further blood work). Pt declines [facility] appt and states she will F/U w/ her [place] in [place].   MDM - 30 year old female with secondary amenorrhea, negative pregnancy test and no symptoms concerning for possible ectopic pregnancy. - Mild postprandial upper abdominal pain and nausea. Recommend workup with PCP for possible gallbladder problems if problems worsen. No evidence of emergent condition.  ASSESSMENT 1. Secondary amenorrhea   2. Epigastric pain    PLAN Discharge home in stable condition. Pregnancy Precautions Discussed possible causes for secondary amenorrhea and workup involved. Follow-up with provider in [facility] as needed. Rx Provera. Follow-up Information    Follow up with  [facility].   Specialty:  Obstetrics and Gynecology   Why:  As needed if symptoms worsen   Contact information:   [address] [place] [address] [phone]       Medication List    TAKE these medications        ibuprofen 600 MG tablet  Commonly known as:  ADVIL,MOTRIN  Take 1 tablet (600 mg total) by mouth every 6 (six) hours as needed.     medroxyPROGESTERone 10 MG tablet  Commonly known as:  PROVERA  Take 1 tablet (10 mg total) by mouth daily. Start if no menstrual period and negative pregnancy test in 2 weeks.  SUMAtriptan 25 MG tablet  Commonly known as:  IMITREX  Take 25 mg by mouth every 2 (two) hours as needed for migraine. May repeat in 2 hours if headache persists or recurs.       Purty RockVirginia Mariena Meares, CNM 03/02/2016  10:10 AM

## 2016-04-05 ENCOUNTER — Encounter: Payer: Self-pay | Admitting: Emergency Medicine

## 2016-04-05 ENCOUNTER — Emergency Department
Admission: EM | Admit: 2016-04-05 | Discharge: 2016-04-05 | Disposition: A | Discharge status: Home / Self Care | Payer: Medicaid Other | Attending: Emergency Medicine | Admitting: Emergency Medicine

## 2016-04-05 DIAGNOSIS — F17210 Nicotine dependence, cigarettes, uncomplicated: Secondary | ICD-10-CM | POA: Insufficient documentation

## 2016-04-05 DIAGNOSIS — R1030 Lower abdominal pain, unspecified: Secondary | ICD-10-CM

## 2016-04-05 LAB — CBC
HEMATOCRIT: 40.5 % (ref 35.0–47.0)
HEMOGLOBIN: 13.7 g/dL (ref 12.0–16.0)
MCH: 29.9 pg (ref 26.0–34.0)
MCHC: 33.7 g/dL (ref 32.0–36.0)
MCV: 88.8 fL (ref 80.0–100.0)
Platelets: 235 10*3/uL (ref 150–440)
RBC: 4.56 MIL/uL (ref 3.80–5.20)
RDW: 13.9 % (ref 11.5–14.5)
WBC: 12.5 10*3/uL — AB (ref 3.6–11.0)

## 2016-04-05 LAB — COMPREHENSIVE METABOLIC PANEL
ALBUMIN: 4 g/dL (ref 3.5–5.0)
ALT: 22 U/L (ref 14–54)
ANION GAP: 5 (ref 5–15)
AST: 16 U/L (ref 15–41)
Alkaline Phosphatase: 57 U/L (ref 38–126)
BILIRUBIN TOTAL: 0.5 mg/dL (ref 0.3–1.2)
BUN: 9 mg/dL (ref 6–20)
CO2: 25 mmol/L (ref 22–32)
Calcium: 8.9 mg/dL (ref 8.9–10.3)
Chloride: 106 mmol/L (ref 101–111)
Creatinine, Ser: 0.9 mg/dL (ref 0.44–1.00)
GLUCOSE: 98 mg/dL (ref 65–99)
POTASSIUM: 3.7 mmol/L (ref 3.5–5.1)
Sodium: 136 mmol/L (ref 135–145)
TOTAL PROTEIN: 7.8 g/dL (ref 6.5–8.1)

## 2016-04-05 LAB — URINALYSIS COMPLETE WITH MICROSCOPIC (ARMC ONLY)
BACTERIA UA: NONE SEEN
Bilirubin Urine: NEGATIVE
Glucose, UA: NEGATIVE mg/dL
HGB URINE DIPSTICK: NEGATIVE
Ketones, ur: NEGATIVE mg/dL
LEUKOCYTES UA: NEGATIVE
NITRITE: NEGATIVE
PH: 7 (ref 5.0–8.0)
Protein, ur: NEGATIVE mg/dL
RBC / HPF: NONE SEEN RBC/hpf (ref 0–5)
SPECIFIC GRAVITY, URINE: 1.012 (ref 1.005–1.030)

## 2016-04-05 LAB — LIPASE, BLOOD: Lipase: 20 U/L (ref 11–51)

## 2016-04-05 LAB — POCT PREGNANCY, URINE: PREG TEST UR: NEGATIVE

## 2016-04-05 NOTE — ED Notes (Signed)
Dr. Lenard LancePaduchowski in room performing portable ultrasound per pt request.

## 2016-04-05 NOTE — ED Notes (Signed)
Abdominal pain x 2 days, states has had pregnancy in past without raise in HCG, last menses 2 to 3 months ago.

## 2016-04-05 NOTE — Discharge Instructions (Signed)

## 2016-04-05 NOTE — ED Notes (Signed)

## 2016-04-05 NOTE — ED Provider Notes (Signed)
[facility] Emergency Department Provider Note  Time seen: 7:50 PM  I have reviewed the triage vital signs and the nursing notes.   HISTORY  Chief Complaint Abdominal Pain    HPI Amy Mcfarland is a 30 y.o. female with a past medical history migraines, presents the emergency department with lower abdominal discomfort, which she describes as cramping. According to the patient for the past several days she has been having some lower abdominal cramping, she states her last period was approximately 3 months ago and she is concerned she is pregnant. Denies any abdominal pain or cramping currently. States her last pregnancy her blood work and urine were negative for pregnancy and she didn't find out she is pregnant until 27 weeks by ultrasound. Patient denies any vaginal discharge or bleeding. Denies any nausea, vomiting, diarrhea, dysuria or fever. Denies any abdominal pain currently.     Past Medical History  Diagnosis Date  . Migraine   . Miscarriage   . Kidney infection   . Migraines   . Kidney infection     Patient Active Problem List   Diagnosis Date Noted  . Encounter for Depo-Provera contraception 06/28/2013    Past Surgical History  Procedure Laterality Date  . Cesarean section  2011  . Cesarean section    . Cesarean section      Current Outpatient Rx  Name  Route  Sig  Dispense  Refill  . ibuprofen (ADVIL,MOTRIN) 600 MG tablet   Oral   Take 1 tablet (600 mg total) by mouth every 6 (six) hours as needed. Patient not taking: Reported on 03/02/2016   30 tablet   0   . medroxyPROGESTERone (PROVERA) 10 MG tablet   Oral   Take 1 tablet (10 mg total) by mouth daily. Start if no menstrual period and negative pregnancy test in 2 weeks.   10 tablet   0   . SUMAtriptan (IMITREX) 25 MG tablet   Oral   Take 25 mg by mouth every 2 (two) hours as needed for migraine. May repeat in 2 hours if headache persists or recurs.            Allergies Kiwi extract; Peach; Strawberry extract; and Vicodin  Family History  Problem Relation Age of Onset  . Asthma Maternal Uncle   . Cancer Maternal Grandmother     breast cancer  . Cancer Paternal Grandmother   . Diabetes Mother   . Hypertension Father   . Diabetes Father     Social History Social History  Substance Use Topics  . Smoking status: Current Some Day Smoker -- 0.25 packs/day for 7 years    Types: Cigarettes  . Smokeless tobacco: Never Used  . Alcohol Use: Yes     Comment: occasionally    Review of Systems Constitutional: Negative for fever. Cardiovascular: Negative for chest pain. Respiratory: Negative for shortness of breath. Gastrointestinal: Lower abdominal cramping, resolved currently. Denies nausea, vomiting, diarrhea. Genitourinary: Negative for dysuria. Musculoskeletal: Negative for back pain. Neurological: Negative for headache 10-point ROS otherwise negative.  ____________________________________________   PHYSICAL EXAM:  VITAL SIGNS: ED Triage Vitals  Enc Vitals Group     BP 04/05/16 1900 125/74 mmHg     Pulse Rate 04/05/16 1900 74     Resp 04/05/16 1900 18     Temp 04/05/16 1900 98.7 F (37.1 C)     Temp Source 04/05/16 1900 Oral     SpO2 04/05/16 1900 99 %     Weight 04/05/16  1900 232 lb (105.235 kg)     Height 04/05/16 1900  (1.676 m)     Head Cir --      Peak Flow --      Pain Score 04/05/16 1901 7     Pain Loc --      Pain Edu? --      Excl. in GC? --     Constitutional: Alert and oriented. Well appearing and in no distress. Eyes: Normal exam ENT   Head: Normocephalic and atraumatic.   Mouth/Throat: Mucous membranes are moist. Cardiovascular: Normal rate, regular rhythm. No murmur Respiratory: Normal respiratory effort without tachypnea nor retractions. Breath sounds are clear  Gastrointestinal: Soft and nontender. No distention.  Musculoskeletal: Nontender with normal range of motion in all  extremities.  Neurologic:  Normal speech and language. No gross focal neurologic deficits Skin:  Skin is warm, dry and intact.  Psychiatric: Mood and affect are normal.   ____________________________________________   INITIAL IMPRESSION / ASSESSMENT AND PLAN / ED COURSE  Pertinent labs & imaging results that were available during my care of the patient were reviewed by me and considered in my medical decision making (see chart for details).  Patient's labs have resulted with a negative urine pregnancy test. Blood work is within normal limits besides a slight leukocytosis of 12,000. I discussed with the patient adding on a blood test to look at her quantitative beta hCG, she states she would rather go home and follow-up with her doctor. I also discussed that her urinalysis has not resulted yet, she states she is tired of waiting and wishes to go home. I also discussed placing a portable ultrasound on the patient to ensure that she is not pregnant, patient also refuses saying she is worried to go home. Patient's blood work is largely within normal limits besides a slight leukocytosis. Urine pregnant test is negative. Patient will be discharged home with PCP follow-up.  ____________________________________________   FINAL CLINICAL IMPRESSION(S) / ED DIAGNOSES  Abdominal cramping   Minna Antis, MD 04/05/16 [phone]

## 2016-05-17 ENCOUNTER — Encounter: Payer: Self-pay | Admitting: Obstetrics and Gynecology

## 2016-05-17 ENCOUNTER — Ambulatory Visit (INDEPENDENT_AMBULATORY_CARE_PROVIDER_SITE_OTHER): Payer: Medicaid Other | Admitting: Obstetrics and Gynecology

## 2016-05-17 VITALS — BP 120/60 | HR 83 | Wt 280.9 lb

## 2016-05-17 DIAGNOSIS — Z3042 Encounter for surveillance of injectable contraceptive: Secondary | ICD-10-CM

## 2016-05-17 DIAGNOSIS — Z3202 Encounter for pregnancy test, result negative: Secondary | ICD-10-CM | POA: Diagnosis not present

## 2016-05-17 DIAGNOSIS — Z30013 Encounter for initial prescription of injectable contraceptive: Secondary | ICD-10-CM | POA: Diagnosis not present

## 2016-05-17 DIAGNOSIS — Z309 Encounter for contraceptive management, unspecified: Secondary | ICD-10-CM | POA: Insufficient documentation

## 2016-05-17 LAB — POCT PREGNANCY, URINE: Preg Test, Ur: NEGATIVE

## 2016-05-17 MED ORDER — MEDROXYPROGESTERONE ACETATE 150 MG/ML IM SUSP
150.0000 mg | Freq: Once | INTRAMUSCULAR | Status: AC
  Administered 2016-05-17: 150 mg via INTRAMUSCULAR

## 2016-05-17 NOTE — Addendum Note (Signed)
Addended by: Faythe CasaBELLAMY, Haileigh Pitz M on: 05/17/2016 06:07 PM   Modules accepted: Orders

## 2016-05-17 NOTE — Progress Notes (Signed)
CLINIC ENCOUNTER NOTE  History:  30 y.o. G2P1011 here today for to re-start depo. Last sex 30 days ago, last period 2 weeks ago. Doesn't think gained weight with depo. Also interested in ocps but hx aura with migraines and smokes. No history breast cancer, no history dvt/pe, no active liver disease.   Past Medical History  Diagnosis Date  . Migraine   . Miscarriage   . Kidney infection   . Migraines   . Kidney infection     Past Surgical History  Procedure Laterality Date  . Cesarean section  2011  . Cesarean section    . Cesarean section      The following portions of the patient's history were reviewed and updated as appropriate: allergies, current medications, past family history, past medical history, past social history, past surgical history and problem list.   Health Maintenance:  Normal pap and negative HRHPV on 2012  Review of Systems:  See above; comprehensive review of systems was otherwise negative.  Objective:  Physical Exam BP 120/60 mmHg  Pulse 83  Wt 280 lb 14.4 oz (127.415 kg)  LMP 04/29/2016 (Exact Date) CONSTITUTIONAL: Well-developed, well-nourished female in no acute distress.  HENT:  Normocephalic, atraumatic SKIN: Skin is warm and dry.  NEUROLGIC: Alert  PSYCHIATRIC: Normal mood and affect.  CARDIOVASCULAR: Normal heart rate noted RESPIRATORY: Effort and breath sounds normal, no problems with respiration noted ABDOMEN: Soft, no distention noted.  No tenderness, rebound or guarding.  PELVIC: Deferred   Labs and Imaging No results found.  Assessment & Plan:   # Contraceptive mgmt - upt negative - depo today, f/u as scheduled  # prevention - due for pap, patient wants to schedule  Routine preventative health maintenance measures emphasized.     Lillee Mooneyhan B. Raygen Dahm, MD OB/GYN Fellow [facility], [facility]

## 2016-08-06 ENCOUNTER — Ambulatory Visit: Payer: Medicaid Other | Admitting: Family Medicine

## 2016-08-16 ENCOUNTER — Encounter (HOSPITAL_COMMUNITY): Payer: Self-pay | Admitting: Emergency Medicine

## 2016-08-16 ENCOUNTER — Emergency Department (HOSPITAL_COMMUNITY)
Admission: EM | Admit: 2016-08-16 | Discharge: 2016-08-16 | Disposition: A | Discharge status: Home / Self Care | Payer: Medicaid Other | Attending: Emergency Medicine | Admitting: Emergency Medicine

## 2016-08-16 DIAGNOSIS — N760 Acute vaginitis: Secondary | ICD-10-CM | POA: Diagnosis not present

## 2016-08-16 DIAGNOSIS — F17210 Nicotine dependence, cigarettes, uncomplicated: Secondary | ICD-10-CM | POA: Insufficient documentation

## 2016-08-16 DIAGNOSIS — N898 Other specified noninflammatory disorders of vagina: Secondary | ICD-10-CM | POA: Diagnosis present

## 2016-08-16 LAB — WET PREP, GENITAL
SPERM: NONE SEEN
Trich, Wet Prep: NONE SEEN
YEAST WET PREP: NONE SEEN

## 2016-08-16 LAB — URINALYSIS, ROUTINE W REFLEX MICROSCOPIC
BILIRUBIN URINE: NEGATIVE
GLUCOSE, UA: NEGATIVE mg/dL
HGB URINE DIPSTICK: NEGATIVE
Ketones, ur: NEGATIVE mg/dL
Leukocytes, UA: NEGATIVE
Nitrite: NEGATIVE
Protein, ur: NEGATIVE mg/dL
SPECIFIC GRAVITY, URINE: 1.025 (ref 1.005–1.030)
pH: 6 (ref 5.0–8.0)

## 2016-08-16 LAB — POC URINE PREG, ED: PREG TEST UR: NEGATIVE

## 2016-08-16 MED ORDER — METRONIDAZOLE 500 MG PO TABS: 500.0000 mg | ORAL_TABLET | Freq: Two times a day (BID) | ORAL | 0 refills | Status: DC

## 2016-08-16 MED ORDER — AZITHROMYCIN 250 MG PO TABS
1000.0000 mg | ORAL_TABLET | Freq: Once | ORAL | Status: AC
  Administered 2016-08-16: 1000 mg via ORAL
  Filled 2016-08-16: qty 4

## 2016-08-16 MED ORDER — CEFTRIAXONE SODIUM 250 MG IJ SOLR
250.0000 mg | Freq: Once | INTRAMUSCULAR | Status: AC
  Administered 2016-08-16: 250 mg via INTRAMUSCULAR
  Filled 2016-08-16: qty 250

## 2016-08-16 NOTE — ED Provider Notes (Signed)
[facility]-EMERGENCY DEPT Provider Note   CSN: [id number] Arrival date & time: 08/16/16  0841     History   Chief Complaint Chief Complaint  Patient presents with  . Vaginal Discharge    HPI Amy StallJasmine L Mcfarland is a 30 y.o. female.  HPI  30 year old female presents with vaginal discharge and dysuria. Dysuria started about 3 days ago. A little bit less urine output than normal, no frequency urgency. 2 days before that she has been having vaginal discharge and itching. She is concern for an STD, which she has never had before. Her friend recently got diagnosed with gonorrhea and chlamydia and had similar symptoms. She had one day of abdominal and back pain but that is gone. No fevers or vomiting. Recently had a negative HIV test at health department, declines repeat testing. Is on Depo.  Past Medical History:  Diagnosis Date  . Kidney infection   . Kidney infection   . Migraine   . Migraines   . Miscarriage     Patient Active Problem List   Diagnosis Date Noted  . Contraceptive management 05/17/2016  . Encounter for Depo-Provera contraception 06/28/2013    Past Surgical History:  Procedure Laterality Date  . CESAREAN SECTION  2011  . CESAREAN SECTION    . CESAREAN SECTION      OB History    Gravida Para Term Preterm AB Living   2 1 1   1 1    SAB TAB Ectopic Multiple Live Births   1              Obstetric Comments   LMP 12/25/14       Home Medications    Prior to Admission medications   Medication Sig Start Date End Date Taking? Authorizing Provider  ibuprofen (ADVIL,MOTRIN) 600 MG tablet Take 1 tablet (600 mg total) by mouth every 6 (six) hours as needed. Patient not taking: Reported on 03/02/2016 11/13/15   Joycie PeekBenjamin Cartner, PA-C  medroxyPROGESTERone (PROVERA) 10 MG tablet Take 1 tablet (10 mg total) by mouth daily. Start if no menstrual period and negative pregnancy test in 2 weeks. Patient not taking: Reported on 05/17/2016 03/02/16   Dorathy KinsmanVirginia Smith, CNM    metroNIDAZOLE (FLAGYL) 500 MG tablet Take 1 tablet (500 mg total) by mouth 2 (two) times daily. One po bid x 7 days 08/16/16   Pricilla LovelessScott Media Pizzini, MD  SUMAtriptan (IMITREX) 25 MG tablet Take 25 mg by mouth every 2 (two) hours as needed for migraine. May repeat in 2 hours if headache persists or recurs.    Historical Provider, MD    Family History Family History  Problem Relation Age of Onset  . Diabetes Mother   . Hypertension Father   . Diabetes Father   . Asthma Maternal Uncle   . Cancer Maternal Grandmother     breast cancer  . Cancer Paternal Grandmother     Social History Social History  Substance Use Topics  . Smoking status: Current Some Day Smoker    Packs/day: 0.25    Years: 7.00    Types: Cigarettes  . Smokeless tobacco: Never Used  . Alcohol use Yes     Comment: occasionally     Allergies   Kiwi extract; Peach [prunus persica]; Strawberry extract; and Vicodin [hydrocodone-acetaminophen]   Review of Systems Review of Systems  Gastrointestinal: Negative for abdominal pain and vomiting.  Genitourinary: Positive for decreased urine volume, dysuria and vaginal discharge. Negative for frequency, urgency and vaginal bleeding.  Musculoskeletal: Negative for back pain.  All other systems reviewed and are negative.    Physical Exam Updated Vital Signs BP 139/76 (BP Location: Left Arm)   Pulse 88   Temp 98.6 F (37 C) (Oral)   Resp 18   Ht 5\' 6"  (1.676 m)   Wt 259 lb (117.5 kg)   LMP 07/19/2016   SpO2 97%   BMI 41.80 kg/m   Physical Exam  Constitutional: She is oriented to person, place, and time. She appears well-developed and well-nourished.  obese  HENT:  Head: Normocephalic and atraumatic.  Right Ear: External ear normal.  Left Ear: External ear normal.  Nose: Nose normal.  Eyes: Right eye exhibits no discharge. Left eye exhibits no discharge.  Cardiovascular: Normal rate, regular rhythm and normal heart sounds.   Pulmonary/Chest: Effort normal and  breath sounds normal.  Abdominal: Soft. She exhibits no distension. There is no tenderness. There is no CVA tenderness.  Genitourinary: Cervix exhibits no motion tenderness. Right adnexum displays no mass. Left adnexum displays no mass. No bleeding in the vagina. Vaginal discharge (yellow/white) found.  Neurological: She is alert and oriented to person, place, and time.  Skin: Skin is warm and dry.  Nursing note and vitals reviewed.    ED Treatments / Results  Labs (all labs ordered are listed, but only abnormal results are displayed) Labs Reviewed  WET PREP, GENITAL - Abnormal; Notable for the following:       Result Value   Clue Cells Wet Prep HPF POC PRESENT (*)    WBC, Wet Prep HPF POC MANY (*)    All other components within normal limits  URINALYSIS, ROUTINE W REFLEX MICROSCOPIC (NOT AT [facility])  POC URINE PREG, ED  GC/CHLAMYDIA PROBE AMP ([place]) NOT AT [facility]    EKG  EKG Interpretation None       Radiology No results found.  Procedures Procedures (including critical care time)  Medications Ordered in ED Medications  cefTRIAXone (ROCEPHIN) injection 250 mg (250 mg Intramuscular Given 08/16/16 1005)  azithromycin (ZITHROMAX) tablet 1,000 mg (1,000 mg Oral Given 08/16/16 1003)     Initial Impression / Assessment and Plan / ED Course  I have reviewed the triage vital signs and the nursing notes.  Pertinent labs & imaging results that were available during my care of the patient were reviewed by me and considered in my medical decision making (see chart for details).  Clinical Course  Comment By Time  No abd/back pain currently. Urine, GU exam. Pricilla Loveless, MD 10/02 [phone]  GU exam shows discharge but no signs/symptoms of PID. Given Rocephin and azithromycin. Given symptom bag discharge with clue cells, will also treat with Flagyl. There has been a delay and the urine, her point-of-care pregnancy is reportedly negative but urinalysis is still pending. She does not  want to wait any longer because she is late for work. Discussed I can't treat or rule out UTI without the urinalysis results. She understands this and still wants to leave now. Pricilla Loveless, MD 10/02 1040    Final Clinical Impressions(s) / ED Diagnoses   Final diagnoses:  Acute vaginitis    New Prescriptions New Prescriptions   METRONIDAZOLE (FLAGYL) 500 MG TABLET    Take 1 tablet (500 mg total) by mouth 2 (two) times daily. One po bid x 7 days     Pricilla Loveless, MD 08/16/16 1041

## 2016-08-16 NOTE — ED Triage Notes (Signed)
Pt comes in to ED w/ c/o vaginal discharge, burning and itching x4 days. Discharge is white/milky w/ odor per pt. C/o intermittent lower back pain.  Pt used Monistat Friday afternoon. Pt AOx4. NAD noted in triage.

## 2016-08-17 LAB — GC/CHLAMYDIA PROBE AMP (~~LOC~~) NOT AT ARMC
CHLAMYDIA, DNA PROBE: NEGATIVE
NEISSERIA GONORRHEA: NEGATIVE

## 2016-09-10 ENCOUNTER — Encounter: Payer: Self-pay | Admitting: Medical Oncology

## 2016-09-10 ENCOUNTER — Emergency Department
Admission: EM | Admit: 2016-09-10 | Discharge: 2016-09-10 | Disposition: A | Discharge status: Home / Self Care | Payer: Medicaid Other | Attending: Emergency Medicine | Admitting: Emergency Medicine

## 2016-09-10 DIAGNOSIS — K047 Periapical abscess without sinus: Secondary | ICD-10-CM | POA: Insufficient documentation

## 2016-09-10 DIAGNOSIS — K0889 Other specified disorders of teeth and supporting structures: Secondary | ICD-10-CM | POA: Diagnosis present

## 2016-09-10 DIAGNOSIS — F17210 Nicotine dependence, cigarettes, uncomplicated: Secondary | ICD-10-CM | POA: Diagnosis not present

## 2016-09-10 DIAGNOSIS — K029 Dental caries, unspecified: Secondary | ICD-10-CM

## 2016-09-10 MED ORDER — IBUPROFEN 800 MG PO TABS: 800.0000 mg | ORAL_TABLET | Freq: Three times a day (TID) | ORAL | 0 refills | Status: DC

## 2016-09-10 MED ORDER — LIDOCAINE VISCOUS 2 % MT SOLN: 20.0000 mL | OROMUCOSAL | 0 refills | Status: DC | PRN

## 2016-09-10 MED ORDER — AMOXICILLIN 875 MG PO TABS: 875.0000 mg | ORAL_TABLET | Freq: Two times a day (BID) | ORAL | 0 refills | Status: DC

## 2016-09-10 NOTE — ED Triage Notes (Signed)
Left upper and lower dental pain x 2 days.

## 2016-09-10 NOTE — ED Provider Notes (Signed)
[facility] Emergency Department Provider Note   ____________________________________________   First MD Initiated Contact with Patient 09/10/16 1346     (approximate)  I have reviewed the triage vital signs and the nursing notes.   HISTORY  Chief Complaint Dental Pain   HPI Amy Mcfarland is a 30 y.o. female is here complaining of dental pain for the last 2 days. Patient states she has cavities in both her left upper and lower jaw. Patient states she has an appointment with a dentist in [place] next month. Patient states she has had difficulty eating the cause of her dental pain. Patient has not taken any over-the-counter medication for pain. Currently she rates her pain as a 10 over 10.   Past Medical History:  Diagnosis Date  . Kidney infection   . Kidney infection   . Migraine   . Migraines   . Miscarriage     Patient Active Problem List   Diagnosis Date Noted  . Contraceptive management 05/17/2016  . Encounter for Depo-Provera contraception 06/28/2013    Past Surgical History:  Procedure Laterality Date  . CESAREAN SECTION  2011  . CESAREAN SECTION    . CESAREAN SECTION      Prior to Admission medications   Medication Sig Start Date End Date Taking? Authorizing Provider  amoxicillin (AMOXIL) 875 MG tablet Take 1 tablet (875 mg total) by mouth 2 (two) times daily. 09/10/16   Tommi Rumpshonda L Summers, PA-C  ibuprofen (ADVIL,MOTRIN) 800 MG tablet Take 1 tablet (800 mg total) by mouth 3 (three) times daily. 09/10/16   Tommi Rumpshonda L Summers, PA-C  lidocaine (XYLOCAINE) 2 % solution Use as directed 20 mLs in the mouth or throat as needed for mouth pain. 09/10/16   Tommi Rumpshonda L Summers, PA-C  medroxyPROGESTERone (PROVERA) 10 MG tablet Take 1 tablet (10 mg total) by mouth daily. Start if no menstrual period and negative pregnancy test in 2 weeks. Patient not taking: Reported on 05/17/2016 03/02/16   Dorathy KinsmanVirginia Smith, CNM  metroNIDAZOLE (FLAGYL) 500 MG tablet  Take 1 tablet (500 mg total) by mouth 2 (two) times daily. One po bid x 7 days 08/16/16   Pricilla LovelessScott Goldston, MD  SUMAtriptan (IMITREX) 25 MG tablet Take 25 mg by mouth every 2 (two) hours as needed for migraine. May repeat in 2 hours if headache persists or recurs.    Historical Provider, MD    Allergies Kiwi extract; Peach [prunus persica]; Strawberry extract; and Vicodin [hydrocodone-acetaminophen]  Family History  Problem Relation Age of Onset  . Diabetes Mother   . Hypertension Father   . Diabetes Father   . Asthma Maternal Uncle   . Cancer Maternal Grandmother     breast cancer  . Cancer Paternal Grandmother     Social History Social History  Substance Use Topics  . Smoking status: Current Some Day Smoker    Packs/day: 0.25    Years: 7.00    Types: Cigarettes  . Smokeless tobacco: Never Used  . Alcohol use Yes     Comment: occasionally    Review of Systems Constitutional: No fever/chills ENT: Positive dental pain. Cardiovascular: Denies chest pain. Respiratory: Denies shortness of breath. Gastrointestinal:   No nausea, no vomiting.   Skin: Negative for rash. Neurological: Negative for headaches, focal weakness or numbness.  10-point ROS otherwise negative.  ____________________________________________   PHYSICAL EXAM:  VITAL SIGNS: ED Triage Vitals  Enc Vitals Group     BP 09/10/16 1227 (!) 144/92     Pulse  Rate 09/10/16 1227 67     Resp 09/10/16 1227 18     Temp 09/10/16 1227 98.5 F (36.9 C)     Temp Source 09/10/16 1227 Oral     SpO2 09/10/16 1227 100 %     Weight 09/10/16 1227 248 lb (112.5 kg)     Height 09/10/16 1227 5\' 6"  (1.676 m)     Head Circumference --      Peak Flow --      Pain Score 09/10/16 1228 10     Pain Loc --      Pain Edu? --      Excl. in GC? --     Constitutional: Alert and oriented. Well appearing and in no acute distress. Eyes: Conjunctivae are normal. PERRL. EOMI. Head: Atraumatic. Nose: No  congestion/rhinnorhea. Mouth/Throat: Mucous membranes are moist.  Oropharynx non-erythematous. Left upper and lower molar with extensive erosion and no dental repair. The gum is swollen. No drainage was noted. Neck: No stridor.   Hematological/Lymphatic/Immunilogical: No cervical lymphadenopathy. Cardiovascular: Normal rate, regular rhythm. Grossly normal heart sounds.  Good peripheral circulation. Respiratory: Normal respiratory effort.  No retractions. Lungs CTAB. Musculoskeletal:Moves upper and lower extremities without any difficulty. Normal gait was noted. Neurologic:  Normal speech and language. No gross focal neurologic deficits are appreciated. No gait instability. Skin:  Skin is warm, dry and intact. No rash noted. Psychiatric: Mood and affect are normal. Speech and behavior are normal.  ____________________________________________   LABS (all labs ordered are listed, but only abnormal results are displayed)  Labs Reviewed - No data to display ____________________________________________  PROCEDURES  Procedure(s) performed: None  Procedures  Critical Care performed: No  ____________________________________________   INITIAL IMPRESSION / ASSESSMENT AND PLAN / ED COURSE  Pertinent labs & imaging results that were available during my care of the patient were reviewed by me and considered in my medical decision making (see chart for details).    Clinical Course   Patient was given a prescription for amoxicillin 875 one tablet twice a day for 10 days. She is also given a prescription for ibuprofen and viscous lidocaine as needed for pain. She is encouraged to keep her appointment with her dentist in [place].  ____________________________________________   FINAL CLINICAL IMPRESSION(S) / ED DIAGNOSES  Final diagnoses:  Dental caries  Infected dental caries      NEW MEDICATIONS STARTED DURING THIS VISIT:  Discharge Medication List as of 09/10/2016  1:54 PM     START taking these medications   Details  amoxicillin (AMOXIL) 875 MG tablet Take 1 tablet (875 mg total) by mouth 2 (two) times daily., Starting Fri 09/10/2016, Print    lidocaine (XYLOCAINE) 2 % solution Use as directed 20 mLs in the mouth or throat as needed for mouth pain., Starting Fri 09/10/2016, Print         Note:  This document was prepared using Dragon voice recognition software and may include unintentional dictation errors.    Tommi Rumps, PA-C 09/10/16 1432    Nita Sickle, MD 09/11/16 [phone]

## 2016-09-10 NOTE — Discharge Instructions (Signed)
Keep your Appointment with the [place] in [place]. Used viscous lidocaine as directed. Amoxicillin twice a day for 10 days. Ibuprofen as needed for pain.

## 2016-10-17 ENCOUNTER — Emergency Department
Admission: EM | Admit: 2016-10-17 | Discharge: 2016-10-17 | Disposition: A | Discharge status: Home / Self Care | Payer: Medicaid Other | Attending: Emergency Medicine | Admitting: Emergency Medicine

## 2016-10-17 ENCOUNTER — Encounter: Payer: Self-pay | Admitting: Emergency Medicine

## 2016-10-17 DIAGNOSIS — F17210 Nicotine dependence, cigarettes, uncomplicated: Secondary | ICD-10-CM | POA: Insufficient documentation

## 2016-10-17 DIAGNOSIS — R110 Nausea: Secondary | ICD-10-CM

## 2016-10-17 DIAGNOSIS — Z791 Long term (current) use of non-steroidal anti-inflammatories (NSAID): Secondary | ICD-10-CM | POA: Diagnosis not present

## 2016-10-17 DIAGNOSIS — R102 Pelvic and perineal pain: Secondary | ICD-10-CM

## 2016-10-17 DIAGNOSIS — Z79899 Other long term (current) drug therapy: Secondary | ICD-10-CM | POA: Insufficient documentation

## 2016-10-17 DIAGNOSIS — N9489 Other specified conditions associated with female genital organs and menstrual cycle: Secondary | ICD-10-CM | POA: Insufficient documentation

## 2016-10-17 DIAGNOSIS — R1030 Lower abdominal pain, unspecified: Secondary | ICD-10-CM | POA: Diagnosis present

## 2016-10-17 LAB — COMPREHENSIVE METABOLIC PANEL
ALK PHOS: 58 U/L (ref 38–126)
ALT: 16 U/L (ref 14–54)
ANION GAP: 5 (ref 5–15)
AST: 18 U/L (ref 15–41)
Albumin: 4 g/dL (ref 3.5–5.0)
BILIRUBIN TOTAL: 0.7 mg/dL (ref 0.3–1.2)
BUN: 8 mg/dL (ref 6–20)
CALCIUM: 8.9 mg/dL (ref 8.9–10.3)
CO2: 26 mmol/L (ref 22–32)
Chloride: 106 mmol/L (ref 101–111)
Creatinine, Ser: 1.03 mg/dL — ABNORMAL HIGH (ref 0.44–1.00)
GLUCOSE: 93 mg/dL (ref 65–99)
POTASSIUM: 3.7 mmol/L (ref 3.5–5.1)
Sodium: 137 mmol/L (ref 135–145)
TOTAL PROTEIN: 7.8 g/dL (ref 6.5–8.1)

## 2016-10-17 LAB — URINALYSIS COMPLETE WITH MICROSCOPIC (ARMC ONLY)
BILIRUBIN URINE: NEGATIVE
GLUCOSE, UA: NEGATIVE mg/dL
HGB URINE DIPSTICK: NEGATIVE
Ketones, ur: NEGATIVE mg/dL
LEUKOCYTES UA: NEGATIVE
NITRITE: NEGATIVE
PH: 8 (ref 5.0–8.0)
Protein, ur: NEGATIVE mg/dL
RBC / HPF: NONE SEEN RBC/hpf (ref 0–5)
SPECIFIC GRAVITY, URINE: 1.011 (ref 1.005–1.030)

## 2016-10-17 LAB — CBC WITH DIFFERENTIAL/PLATELET
BASOS ABS: 0.1 10*3/uL (ref 0–0.1)
BASOS PCT: 1 %
Eosinophils Absolute: 0.1 10*3/uL (ref 0–0.7)
Eosinophils Relative: 1 %
HEMATOCRIT: 42.2 % (ref 35.0–47.0)
Hemoglobin: 14.5 g/dL (ref 12.0–16.0)
LYMPHS PCT: 20 %
Lymphs Abs: 2 10*3/uL (ref 1.0–3.6)
MCH: 30.1 pg (ref 26.0–34.0)
MCHC: 34.3 g/dL (ref 32.0–36.0)
MCV: 87.7 fL (ref 80.0–100.0)
MONO ABS: 0.9 10*3/uL (ref 0.2–0.9)
Monocytes Relative: 9 %
NEUTROS ABS: 7.1 10*3/uL — AB (ref 1.4–6.5)
NEUTROS PCT: 69 %
Platelets: 214 10*3/uL (ref 150–440)
RBC: 4.81 MIL/uL (ref 3.80–5.20)
RDW: 14.4 % (ref 11.5–14.5)
WBC: 10.1 10*3/uL (ref 3.6–11.0)

## 2016-10-17 LAB — HCG, QUANTITATIVE, PREGNANCY: hCG, Beta Chain, Quant, S: <1 m[IU]/mL (ref <5)

## 2016-10-17 NOTE — Discharge Instructions (Signed)
You were evaluated for irregular vaginal bleeding, some nausea, as well as intermittent lower abdominal cramping, and as we discussed her exam and evaluation are reassuring in the emergency department today.  Your blood pregnancy test, beta hCG is negative today.  Return to emergency department for any worsening condition including new or worsening abdominal pain, vaginal discharge, vaginal bleeding, fever, nausea, vomiting, diarrhea, bloody stools, dizziness or passing out, or any other symptoms concerning to you.

## 2016-10-17 NOTE — ED Provider Notes (Signed)
[facility] Emergency Department Provider Note ____________________________________________   I have reviewed the triage vital signs and the triage nursing note.  HISTORY  Chief Complaint Possible Pregnancy   Historian Patient  HPI Amy StallJasmine L Muzzy is a 30 y.o. female [id number], miscarriage several years ago approx [redacted] weeks along, here requesting to be evaluated for blood pregnancy test because she never tested positive with urine for prior two pregnancies.  She had some mild suprapubic cramping.  She's had a little bit of nausea, no epigastric pain. No changes in bowel movements. No fever. No coughing.  She states that she previously primary doctor in [place] but is no longer following there but when she called they told her to come to the hospital for evaluation.   Past Medical History:  Diagnosis Date  . Kidney infection   . Kidney infection   . Migraine   . Migraines   . Miscarriage     Patient Active Problem List   Diagnosis Date Noted  . Contraceptive management 05/17/2016  . Encounter for Depo-Provera contraception 06/28/2013    Past Surgical History:  Procedure Laterality Date  . CESAREAN SECTION  2011  . CESAREAN SECTION    . CESAREAN SECTION      Prior to Admission medications   Medication Sig Start Date End Date Taking? Authorizing Provider  amoxicillin (AMOXIL) 875 MG tablet Take 1 tablet (875 mg total) by mouth 2 (two) times daily. 09/10/16   Tommi Rumpshonda L Summers, PA-C  ibuprofen (ADVIL,MOTRIN) 800 MG tablet Take 1 tablet (800 mg total) by mouth 3 (three) times daily. 09/10/16   Tommi Rumpshonda L Summers, PA-C  lidocaine (XYLOCAINE) 2 % solution Use as directed 20 mLs in the mouth or throat as needed for mouth pain. 09/10/16   Tommi Rumpshonda L Summers, PA-C  medroxyPROGESTERone (PROVERA) 10 MG tablet Take 1 tablet (10 mg total) by mouth daily. Start if no menstrual period and negative pregnancy test in 2 weeks. Patient not taking: Reported on 05/17/2016  03/02/16   Dorathy KinsmanVirginia Smith, CNM  metroNIDAZOLE (FLAGYL) 500 MG tablet Take 1 tablet (500 mg total) by mouth 2 (two) times daily. One po bid x 7 days 08/16/16   Pricilla LovelessScott Goldston, MD  SUMAtriptan (IMITREX) 25 MG tablet Take 25 mg by mouth every 2 (two) hours as needed for migraine. May repeat in 2 hours if headache persists or recurs.    Historical Provider, MD    Allergies  Allergen Reactions  . Kiwi Extract Other (See Comments)    Makes mouth feel hairy  . Peach [Prunus Persica] Other (See Comments)    Makes mouth feel hairy  . Strawberry Extract Other (See Comments)    Makes mouth feel like it has hair in it  . Vicodin [Hydrocodone-Acetaminophen] Swelling    Family History  Problem Relation Age of Onset  . Diabetes Mother   . Hypertension Father   . Diabetes Father   . Asthma Maternal Uncle   . Cancer Maternal Grandmother     breast cancer  . Cancer Paternal Grandmother     Social History Social History  Substance Use Topics  . Smoking status: Current Some Day Smoker    Packs/day: 0.25    Years: 7.00    Types: Cigarettes  . Smokeless tobacco: Never Used  . Alcohol use Yes     Comment: occasionally    Review of Systems  Constitutional: Negative for fever. Eyes: Negative for visual changes. ENT: Negative for sore throat. Cardiovascular: Negative for chest pain. Respiratory:  Negative for shortness of breath. Gastrointestinal: As per history of present illness. Genitourinary: Negative for dysuria.  Negative for vaginal discharge. She last took Depo shot in June. She had 3 weeks of bleeding in September and none since then. Musculoskeletal: Negative for back pain. Skin: Negative for rash. Neurological: Negative for headache. 10 point Review of Systems otherwise negative ____________________________________________   PHYSICAL EXAM:  VITAL SIGNS: ED Triage Vitals  Enc Vitals Group     BP 10/17/16 1119 133/70     Pulse Rate 10/17/16 1119 83     Resp 10/17/16 1119 18      Temp 10/17/16 1119 98.3 F (36.8 C)     Temp Source 10/17/16 1119 Oral     SpO2 10/17/16 1119 100 %     Weight 10/17/16 1120 279 lb (126.6 kg)     Height 10/17/16 1120 5\' 6"  (1.676 m)     Head Circumference --      Peak Flow --      Pain Score 10/17/16 1120 7     Pain Loc --      Pain Edu? --      Excl. in GC? --      Constitutional: Alert and oriented. Well appearing and in no distress. HEENT   Head: Normocephalic and atraumatic.      Eyes: Conjunctivae are normal. PERRL. Normal extraocular movements.      Ears:         Nose: No congestion/rhinnorhea.   Mouth/Throat: Mucous membranes are moist.   Neck: No stridor. Cardiovascular/Chest: Normal rate, regular rhythm.  No murmurs, rubs, or gallops. Respiratory: Normal respiratory effort without tachypnea nor retractions. Breath sounds are clear and equal bilaterally. No wheezes/rales/rhonchi. Gastrointestinal: Soft. No distention, no guarding, no rebound. Nontender.    Genitourinary/rectal: Patient declined. Musculoskeletal: Nontender with normal range of motion in all extremities. No joint effusions.  No lower extremity tenderness.  No edema. Neurologic:  Normal speech and language. No gross or focal neurologic deficits are appreciated. Skin:  Skin is warm, dry and intact. No rash noted. Psychiatric: Mood and affect are normal. Speech and behavior are normal. Patient exhibits appropriate insight and judgment.   ____________________________________________  LABS (pertinent positives/negatives)  Labs Reviewed  URINALYSIS COMPLETEWITH MICROSCOPIC ([facility] ONLY) - Abnormal; Notable for the following:       Result Value   Color, Urine YELLOW (*)    APPearance CLEAR (*)    Bacteria, UA RARE (*)    Squamous Epithelial / LPF 0-5 (*)    All other components within normal limits  COMPREHENSIVE METABOLIC PANEL - Abnormal; Notable for the following:    Creatinine, Ser 1.03 (*)    All other components within normal limits   CBC WITH DIFFERENTIAL/PLATELET - Abnormal; Notable for the following:    Neutro Abs 7.1 (*)    All other components within normal limits  HCG, QUANTITATIVE, PREGNANCY    ____________________________________________    EKG I, Governor Rooks, MD, the attending physician have personally viewed and interpreted all ECGs.  None ____________________________________________  RADIOLOGY All Xrays were viewed by me. Imaging interpreted by Radiologist.  None __________________________________________  PROCEDURES  Procedure(s) performed: None  Critical Care performed: None  ____________________________________________   ED COURSE / ASSESSMENT AND PLAN  Pertinent labs & imaging results that were available during my care of the patient were reviewed by me and considered in my medical decision making (see chart for details).   Ms. Malbrough is here essentially for a beta hCG blood test. She  states that she is on Depakote and had some irregular bleeding in September, and has in the past had 2 pregnancies with negative urine pregnancy test. She has had negative urine pregnancy test at home. She's had a little bit of nausea which she never had with prior pregnancies, but because of that and some mild suprapubic cramping she wanted to be evaluated.  On exam she is well-appearing, nontender abdominal exam.  I discussed performing pelvic exam, but patient states that she is recently been tested for STDs, and she is not having vaginal bleeding or discharge, and declines pelvic exam at this point time.  HCG is negative.  Respiratory laboratory studies are reassuring. We discussed this and I will refer her both for [facility] & clinic primary care follow-up here locally.    CONSULTATIONS:   None   Patient / Family / Caregiver informed of clinical course, medical decision-making process, and agree with plan.   I discussed return precautions, follow-up instructions, and discharge  instructions with patient and/or family.   ___________________________________________   FINAL CLINICAL IMPRESSION(S) / ED DIAGNOSES   Final diagnoses:  Nausea  Suprapubic cramping              Note: This dictation was prepared with Dragon dictation. Any transcriptional errors that result from this process are unintentional    Governor Rooksebecca Sherise Geerdes, MD 10/17/16 [phone]

## 2016-10-17 NOTE — ED Triage Notes (Signed)
Pt presents to ED c/o nausea and possible pregnancy for 3 weeks. Pt took home pregnancy test which was negative; usually gets tested by blood work

## 2016-10-17 NOTE — ED Notes (Signed)
Pt alert and oriented X4, active, cooperative, pt in NAD. RR even and unlabored, color WNL.  Pt informed to return if any life threatening symptoms occur.   

## 2016-12-09 ENCOUNTER — Encounter: Payer: Self-pay | Admitting: Emergency Medicine

## 2016-12-09 ENCOUNTER — Emergency Department
Admission: EM | Admit: 2016-12-09 | Discharge: 2016-12-09 | Disposition: A | Discharge status: Home / Self Care | Payer: Medicaid Other | Attending: Emergency Medicine | Admitting: Emergency Medicine

## 2016-12-09 DIAGNOSIS — R51 Headache: Secondary | ICD-10-CM | POA: Diagnosis not present

## 2016-12-09 DIAGNOSIS — R519 Headache, unspecified: Secondary | ICD-10-CM

## 2016-12-09 DIAGNOSIS — R112 Nausea with vomiting, unspecified: Secondary | ICD-10-CM | POA: Insufficient documentation

## 2016-12-09 DIAGNOSIS — G43909 Migraine, unspecified, not intractable, without status migrainosus: Secondary | ICD-10-CM | POA: Diagnosis present

## 2016-12-09 DIAGNOSIS — N9489 Other specified conditions associated with female genital organs and menstrual cycle: Secondary | ICD-10-CM | POA: Diagnosis not present

## 2016-12-09 DIAGNOSIS — F17210 Nicotine dependence, cigarettes, uncomplicated: Secondary | ICD-10-CM | POA: Diagnosis not present

## 2016-12-09 LAB — HCG, QUANTITATIVE, PREGNANCY: hCG, Beta Chain, Quant, S: <1 m[IU]/mL (ref <5)

## 2016-12-09 MED ORDER — ONDANSETRON 4 MG PO TBDP
ORAL_TABLET | ORAL | Status: AC
  Filled 2016-12-09: qty 1

## 2016-12-09 MED ORDER — ONDANSETRON 4 MG PO TBDP: 4.0000 mg | ORAL_TABLET | Freq: Three times a day (TID) | ORAL | 0 refills | Status: DC | PRN

## 2016-12-09 MED ORDER — IBUPROFEN 400 MG PO TABS
ORAL_TABLET | ORAL | Status: AC
  Filled 2016-12-09: qty 1

## 2016-12-09 MED ORDER — ONDANSETRON 4 MG PO TBDP
4.0000 mg | ORAL_TABLET | Freq: Once | ORAL | Status: AC
  Administered 2016-12-09: 4 mg via ORAL
  Filled 2016-12-09: qty 1

## 2016-12-09 MED ORDER — IBUPROFEN 400 MG PO TABS
400.0000 mg | ORAL_TABLET | Freq: Once | ORAL | Status: AC | PRN
  Administered 2016-12-09: 400 mg via ORAL

## 2016-12-09 MED ORDER — ONDANSETRON 4 MG PO TBDP
4.0000 mg | ORAL_TABLET | Freq: Once | ORAL | Status: AC
  Administered 2016-12-09: 4 mg via ORAL

## 2016-12-09 MED ORDER — SUMATRIPTAN SUCCINATE 6 MG/0.5ML ~~LOC~~ SOLN
6.0000 mg | Freq: Once | SUBCUTANEOUS | Status: AC
  Administered 2016-12-09: 6 mg via SUBCUTANEOUS
  Filled 2016-12-09: qty 0.5

## 2016-12-09 NOTE — ED Notes (Signed)
Patient presents to the ED with migraine and history of migraines.  Patient states she normally takes 25mg  imitrex but she states her prescription had expired and she didn't have any left.  Patient reports having some nausea and vomiting along with her migraine.  Patient reports photophobia.  Lights in room are turned down.

## 2016-12-09 NOTE — ED Triage Notes (Signed)
Patient presents to ED via POV from home with c/o migraine that began last night. Hx of same. Patient c/o nausea with 5 episodes of vomiting. Patient has eyes covered in triage. Patient A&O x4.

## 2016-12-09 NOTE — ED Notes (Signed)
[facility]   Did not make appt for ultrasound  States last time she was preg that had to do an U/S

## 2016-12-09 NOTE — Discharge Instructions (Signed)
Follow up with [facility] for further workup of your headaches. You will also need to see them for any continued migraine medication. Increase fluids today. Zofran if needed for nausea. Also make an appointment for the health department for further evaluation of your period problems.

## 2016-12-09 NOTE — ED Provider Notes (Signed)
[facility] Emergency Department Provider Note  ____________________________________________   First MD Initiated Contact with Patient 12/09/16 1323     (approximate)  I have reviewed the triage vital signs and the nursing notes.   HISTORY  Chief Complaint Migraine   HPI Amy Mcfarland is a 31 y.o. female is here with complaint of migraine that began last night. Patient states she had 5 episodes of vomiting since last night. She has taken Imitrex in the past for her migraines and this headache is no different than what she normally experiences. She had a prescription for Imitrex but this is expired. She was last seen at [facility] for a migraine earlier she was given the prescription for Imitrex. She is also to follow-up with a headache specialist which she did not. Patient also states that she has not had a period and normally has to have a ultrasound to detect her pregnancies. She had a negative [facility] and was told to schedule for an ultrasound which she did not.patient has been on Depo "for a long time". Currently she rates her pain as a 10 over 10. She last vomited just prior to being placed in the exam room. She was given Zofran.   Past Medical History:  Diagnosis Date  . Kidney infection   . Kidney infection   . Migraine   . Migraines   . Miscarriage     Patient Active Problem List   Diagnosis Date Noted  . Contraceptive management 05/17/2016  . Encounter for Depo-Provera contraception 06/28/2013    Past Surgical History:  Procedure Laterality Date  . CESAREAN SECTION  2011  . CESAREAN SECTION    . CESAREAN SECTION      Prior to Admission medications   Medication Sig Start Date End Date Taking? Authorizing Provider  ondansetron (ZOFRAN ODT) 4 MG disintegrating tablet Take 1 tablet (4 mg total) by mouth every 8 (eight) hours as needed for nausea or vomiting. 12/09/16   Tommi Rumps, PA-C    SUMAtriptan (IMITREX) 25 MG tablet Take 25 mg by mouth every 2 (two) hours as needed for migraine. May repeat in 2 hours if headache persists or recurs.    Historical Provider, MD    Allergies Kiwi extract; Peach [prunus persica]; Strawberry extract; and Vicodin [hydrocodone-acetaminophen]  Family History  Problem Relation Age of Onset  . Diabetes Mother   . Hypertension Father   . Diabetes Father   . Asthma Maternal Uncle   . Cancer Maternal Grandmother     breast cancer  . Cancer Paternal Grandmother     Social History Social History  Substance Use Topics  . Smoking status: Current Some Day Smoker    Packs/day: 0.25    Years: 7.00    Types: Cigarettes  . Smokeless tobacco: Never Used  . Alcohol use Yes     Comment: occasionally    Review of Systems Constitutional: No fever/chills Eyes: no visual changes but patient does have photophobia. ENT: No sore throat. Cardiovascular: Denies chest pain. Respiratory: Denies shortness of breath. Gastrointestinal: No abdominal pain.  positive nausea,positive vomiting.  Genitourinary: Negative for dysuria. Musculoskeletal: Negative for back pain. Skin: Negative for rash. Neurological: positive for headaches, nofocal weakness or numbness.  10-point ROS otherwise negative.  ____________________________________________   PHYSICAL EXAM:  VITAL SIGNS: ED Triage Vitals  Enc Vitals Group     BP 12/09/16 1152 132/82     Pulse Rate 12/09/16 1152 75  Resp 12/09/16 1152 20     Temp 12/09/16 1152 98 F (36.7 C)     Temp Source 12/09/16 1152 Oral     SpO2 12/09/16 1152 100 %     Weight 12/09/16 1151 248 lb (112.5 kg)     Height 12/09/16 1151 5\' 6"  (1.676 m)     Head Circumference --      Peak Flow --      Pain Score 12/09/16 1156 10     Pain Loc --      Pain Edu? --      Excl. in GC? --     Constitutional: Alert and oriented. Well appearing and in no acute distress.patient is lying in a darkened room. Eyes:  Conjunctivae are normal. PERRL. EOMI. Sensitive to light. Head: Atraumatic. Nose: No congestion/rhinnorhea. ACs and TMs are clear bilaterally. Mouth/Throat: Mucous membranes are moist.  Oropharynx non-erythematous. Neck: No stridor.   Hematological/Lymphatic/Immunilogical: No cervical lymphadenopathy. Cardiovascular: Normal rate, regular rhythm. Grossly normal heart sounds.  Good peripheral circulation. Respiratory: Normal respiratory effort.  No retractions. Lungs CTAB. Musculoskeletal: his upper and lower extremities without any difficulty. Normal gait was noted. Neurologic:  Normal speech and language. No gross focal neurologic deficits are appreciated. No gait instability. Skin:  Skin is warm, dry and intact. No rash noted. Psychiatric: Mood and affect are normal. Speech and behavior are normal.  ____________________________________________   LABS (all labs ordered are listed, but only abnormal results are displayed)  Labs Reviewed  HCG, QUANTITATIVE, PREGNANCY  POC URINE PREG, ED     PROCEDURES  Procedure(s) performed: None  Procedures  Critical Care performed: No  ____________________________________________   INITIAL IMPRESSION / ASSESSMENT AND PLAN / ED COURSE  Pertinent labs & imaging results that were available during my care of the patient were reviewed by me and considered in my medical decision making (see chart for details).    Clinical Course as of Dec 09 1541  Thu Dec 09, 2016  1458 HCG, Beta Chain, Quant, S: <1 [RS]  1458 HCG, Beta Chain, Quant, S: <1 [RS]    Clinical Course User Index [RS] Tommi Rumpshonda L Summers, PA-C   ----------------------------------------- 3:47 PM on 12/09/2016 ----------------------------------------- Patient analysis that her headache is much better and is really be discharged.    Patient was told that her beta was less than 1. Patient was given Imitrex and Zofran. She is to follow-up with [facility] current clinic for further  evaluation of her headaches. She is also given a prescription for Zofran to take if  she continues to have any nausea. Patient did not exhibit the classic migraine symptoms and was talkative and occasionally laughed. She continued to be photophobic. She is encouraged to continue to make an appointment for a headache workup and also follow-up with the health Department about her irregular periods.  ____________________________________________   FINAL CLINICAL IMPRESSION(S) / ED DIAGNOSES  Final diagnoses:  Acute nonintractable headache, unspecified headache type      NEW MEDICATIONS STARTED DURING THIS VISIT:  New Prescriptions   ONDANSETRON (ZOFRAN ODT) 4 MG DISINTEGRATING TABLET    Take 1 tablet (4 mg total) by mouth every 8 (eight) hours as needed for nausea or vomiting.     Note:  This document was prepared using Dragon voice recognition software and may include unintentional dictation errors.    Tommi Rumpshonda L Summers, PA-C 12/09/16 1543    Tommi Rumpshonda L Summers, PA-C 12/09/16 1548    Emily FilbertJonathan E Williams, MD 12/10/16 1455

## 2017-02-14 ENCOUNTER — Emergency Department
Admission: EM | Admit: 2017-02-14 | Discharge: 2017-02-14 | Discharge status: Against Medical Advice | Payer: Medicaid Other | Attending: Emergency Medicine | Admitting: Emergency Medicine

## 2017-02-14 ENCOUNTER — Encounter: Payer: Self-pay | Admitting: Emergency Medicine

## 2017-02-14 DIAGNOSIS — J029 Acute pharyngitis, unspecified: Secondary | ICD-10-CM | POA: Diagnosis not present

## 2017-02-14 DIAGNOSIS — F17210 Nicotine dependence, cigarettes, uncomplicated: Secondary | ICD-10-CM | POA: Insufficient documentation

## 2017-02-14 DIAGNOSIS — N644 Mastodynia: Secondary | ICD-10-CM | POA: Diagnosis not present

## 2017-02-14 LAB — POCT RAPID STREP A: Streptococcus, Group A Screen (Direct): NEGATIVE

## 2017-02-14 LAB — POCT PREGNANCY, URINE: Preg Test, Ur: NEGATIVE

## 2017-02-14 MED ORDER — DEXAMETHASONE 10 MG/ML FOR PEDIATRIC ORAL USE
10.0000 mg | Freq: Once | INTRAMUSCULAR | Status: DC
  Filled 2017-02-14: qty 1

## 2017-02-14 MED ORDER — IBUPROFEN 600 MG PO TABS: 600.0000 mg | ORAL_TABLET | Freq: Once | ORAL | Status: DC

## 2017-02-14 NOTE — ED Notes (Signed)
Went in room to assess patient. Patient stated " I am not going to wait on the paper work. I am going somewhere else where they will do something for me."

## 2017-02-14 NOTE — ED Triage Notes (Signed)
Patient with complaint of sore throat and chills that started yesterday. Patient also states that she has breast tenderness that started 4 days ago. Patient states that the only time she has had the same breast tenderness is when she pregnant. Patient states that she could be pregnant that she stopped getting the depo injection 7 months ago and has not had a period since.
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
## 2017-02-15 ENCOUNTER — Telehealth: Payer: Self-pay | Admitting: Obstetrics and Gynecology

## 2017-02-15 NOTE — Telephone Encounter (Signed)
Can have follow up any provider in the next 1-2 weeks

## 2017-02-15 NOTE — Telephone Encounter (Signed)
Pt is calling today withthat she was seen in the ER on the past Monday. Please advise ER Notes so I may  schedule. Pt reports breast very tender to touch. ZO#[phone]

## 2017-02-15 NOTE — Telephone Encounter (Signed)
Called and left voicemail for appt schedule 02/25/17 at 10:30

## 2017-02-15 NOTE — Telephone Encounter (Signed)
Please advise and route to [place]. Thanks

## 2017-02-25 ENCOUNTER — Encounter: Payer: Self-pay | Admitting: Advanced Practice Midwife

## 2017-03-01 ENCOUNTER — Encounter: Payer: Medicaid Other | Admitting: Obstetrics and Gynecology

## 2017-03-01 ENCOUNTER — Ambulatory Visit (INDEPENDENT_AMBULATORY_CARE_PROVIDER_SITE_OTHER): Payer: Medicaid Other | Admitting: Obstetrics and Gynecology

## 2017-03-01 ENCOUNTER — Encounter: Payer: Self-pay | Admitting: Obstetrics and Gynecology

## 2017-03-01 VITALS — BP 134/80 | HR 88 | Ht 66.5 in | Wt 273.0 lb

## 2017-03-01 DIAGNOSIS — N911 Secondary amenorrhea: Secondary | ICD-10-CM

## 2017-03-01 DIAGNOSIS — F458 Other somatoform disorders: Secondary | ICD-10-CM

## 2017-03-01 NOTE — Progress Notes (Signed)
Obstetrics & Gynecology Office Visit   Chief Complaint:  Chief Complaint  Patient presents with  . ER follow up    no cycle since August    History of Present Illness: Patient is a 31 year old G2P1011 presenting for ER follow up of amenorrhea.  She has not had a cycle in the last 8 months since stopping depo provera.  She reports breast tenderness, nausea, and is convinced that she is pregnant despite negative UPT in the emergency department.  She eloped from the ED after presentation when the provider refused to order an ultrasound.  She states in her prior pregnancies both blood and urine pregnancy tests were negative.    Prior to depo provera her periods were irregular and heavy.  She denies weight changes in the past year.  No hirsutism or acne.  Denies constipation or diarrhea.  No temperature intolerance.  No nipple discharge or headaches.  Has not started any new medicatons   Review of Systems: Review of Systems  Constitutional: Positive for malaise/fatigue. Negative for chills, fever and weight loss.  Respiratory: Negative for cough and shortness of breath.   Cardiovascular: Negative for chest pain and palpitations.  Gastrointestinal: Positive for nausea. Negative for abdominal pain, constipation, diarrhea, heartburn and vomiting.  Genitourinary: Negative for dysuria, frequency and urgency.  Skin: Negative for itching and rash.  Neurological: Negative for dizziness and headaches.  Endo/Heme/Allergies: Negative for polydipsia.  Psychiatric/Behavioral: Negative for depression.    Past Medical History:  Past Medical History:  Diagnosis Date  . Kidney infection   . Kidney infection   . Migraine   . Migraines   . Miscarriage     Past Surgical History:  Past Surgical History:  Procedure Laterality Date  . CESAREAN SECTION  2011  . CESAREAN SECTION    . CESAREAN SECTION    . DILATION AND CURETTAGE OF UTERUS  2006    Gynecologic History: Patient's last menstrual  period was 07/12/2016.  Obstetric History: G2P1011  Family History:  Family History  Problem Relation Age of Onset  . Diabetes Mother   . Hypertension Father   . Diabetes Father   . Asthma Maternal Uncle   . Cancer Maternal Grandmother     breast cancer  . Cancer Paternal Grandmother     Social History:  Social History   Social History  . Marital status: Single    Spouse name: N/A  . Number of children: N/A  . Years of education: N/A   Occupational History  . Not on file.   Social History Main Topics  . Smoking status: Current Some Day Smoker    Packs/day: 0.25    Years: 7.00    Types: Cigarettes  . Smokeless tobacco: Never Used  . Alcohol use Yes     Comment: occasionally  . Drug use: No     Comment: Quit Marijuana 01/2017  . Sexual activity: Yes    Birth control/ protection: None   Other Topics Concern  . Not on file   Social History Narrative  . No narrative on file    Allergies:  Allergies  Allergen Reactions  . Kiwi Extract Other (See Comments)    Makes mouth feel hairy  . Peach [Prunus Persica] Other (See Comments)    Makes mouth feel hairy  . Strawberry Extract Other (See Comments)    Makes mouth feel like it has hair in it  . Vicodin [Hydrocodone-Acetaminophen] Swelling    Medications: Prior to Admission medications  Medication Sig Start Date End Date Taking? Authorizing Provider  SUMAtriptan (IMITREX) 25 MG tablet Take 25 mg by mouth every 2 (two) hours as needed for migraine. May repeat in 2 hours if headache persists or recurs.    Historical Provider, MD    Physical Exam Vitals:  Vitals:   03/01/17 1427  BP: 134/80  Pulse: 88   Patient's last menstrual period was 07/12/2016.  General: NAD HEENT: normocephalic, anicteric Thyroid: no enlargement, no palpable nodules Pulmonary: No increased work of breathing Abdomen: NABS, soft, non-tender, non-distended.  Umbilicus without lesions.  No hepatomegaly, splenomegaly or masses  palpable. No evidence of hernia  Genitourinary:  External: Normal external female genitalia.  Normal urethral meatus, normal  Bartholin's and Skene's glands.    Vagina: Normal vaginal mucosa, no evidence of prolapse.    Cervix: Grossly normal in appearance, no bleeding  Uterus: Non-enlarged, mobile, normal contour.  No CMT  Adnexa: ovaries non-enlarged, no adnexal masses  Rectal: deferred  Lymphatic: no evidence of inguinal lymphadenopathy Extremities: no edema, erythema, or tenderness Neurologic: Grossly intact Psychiatric: mood appropriate, affect full  Female chaperone present for pelvic and breast  portions of the physical exam  Assessment: 31 y.o. [id number] presenting with secondary amenorrhea Plan: Problem List Items Addressed This Visit    None    Visit Diagnoses    Pseudocyesis    -  Primary   Relevant Orders   TSH+Prl+FSH+TestT+LH+DHEA S...   US Transvaginal Non-OB   Beta HCG, Quant   Secondary amenorrhea       Relevant Orders   TSH+Prl+FSH+TestT+LH+DHEA S...   US Transvaginal Non-OB   Beta HCG, Quant      - work up for secondary amenorrhea, suspect possible PCOS.  Will obtain basic lab work up for PCOS and obtain TVUS in the next 1-2 weeks - at least 6 negative UPT's or serum HCG in the last year - we discussed there are rare instance were urine pregnancy test may be negative, obtained serum HCG today -.A total of 20 minutes were spent in face-to-face contact with the patient during this encounter with over half of that time devoted to counseling and coordination of care.

## 2017-03-01 NOTE — Patient Instructions (Signed)
Secondary Amenorrhea Secondary amenorrhea is the stopping of menstrual flow for 3-6 months in a female who has previously had periods. There are many possible causes. Most of these causes are not serious. Usually, treating the underlying problem causing the loss of menses will return your periods to normal. What are the causes? Some common and uncommon causes of not menstruating include:  Malnutrition.  Low blood sugar (hypoglycemia).  Polycystic ovary disease.  Stress or fear.  Breastfeeding.  Hormone imbalance.  Ovarian failure.  Medicines.  Extreme obesity.  Cystic fibrosis.  Low body weight or drastic weight reduction from any cause.  Early menopause.  Removal of ovaries or uterus.  Contraceptives.  Illness.  Long-term (chronic) illnesses.  Cushing syndrome.  Thyroid problems.  Birth control pills, patches, or vaginal rings for birth control. What increases the risk? You may be at greater risk of secondary amenorrhea if:  You have a family history of this condition.  You have an eating disorder.  You do athletic training. How is this diagnosed? A diagnosis is made by your health care provider taking a medical history and doing a physical exam. This will include a pelvic exam to check for problems with your reproductive organs. Pregnancy must be ruled out. Often, numerous blood tests are done to measure different hormones in the body. Urine testing may be done. Specialized exams (ultrasound, CT scan, MRI, or hysteroscopy) may have to be done as well as measuring the body mass index (BMI). How is this treated? Treatment depends on the cause of the amenorrhea. If an eating disorder is present, this can be treated with an adequate diet and therapy. Chronic illnesses may improve with treatment of the illness. Amenorrhea may be corrected with medicines, lifestyle changes, or surgery. If the amenorrhea cannot be corrected, it is sometimes possible to create a false  menstruation with medicines. Follow these instructions at home:  Maintain a healthy diet.  Manage weight problems.  Exercise regularly but not excessively.  Get adequate sleep.  Manage stress.  Be aware of changes in your menstrual cycle. Keep a record of when your periods occur. Note the date your period starts, how long it lasts, and any problems. Contact a health care provider if: Your symptoms do not get better with treatment. This information is not intended to replace advice given to you by your health care provider. Make sure you discuss any questions you have with your health care provider. Document Released: 12/13/2006 Document Revised: 04/08/2016 Document Reviewed: 04/19/2013 Elsevier Interactive Patient Education  2017 Elsevier Inc.  

## 2017-03-03 ENCOUNTER — Encounter: Payer: Self-pay | Admitting: Obstetrics and Gynecology

## 2017-03-04 LAB — TSH+PRL+FSH+TESTT+LH+DHEA S...
17 HYDROXYPROGESTERONE: 32 ng/dL
ANDROSTENEDIONE: 50 ng/dL (ref 41–262)
DHEA-SO4: 419.6 ug/dL — ABNORMAL HIGH (ref 84.8–378.0)
FSH: 5.4 m[IU]/mL
LH: 4.8 m[IU]/mL
PROLACTIN: 16.6 ng/mL (ref 4.8–23.3)
TSH: 0.853 u[IU]/mL (ref 0.450–4.500)
Testosterone, Free: 2.6 pg/mL (ref 0.0–4.2)
Testosterone: 91 ng/dL — ABNORMAL HIGH (ref 8–48)

## 2017-03-04 LAB — BETA HCG QUANT (REF LAB): hCG Quant: <1 m[IU]/mL

## 2017-03-04 NOTE — Telephone Encounter (Signed)
No labs back

## 2017-03-07 ENCOUNTER — Encounter: Payer: Self-pay | Admitting: Obstetrics and Gynecology

## 2017-03-14 ENCOUNTER — Emergency Department: Payer: Medicaid Other

## 2017-03-14 ENCOUNTER — Encounter: Payer: Self-pay | Admitting: Emergency Medicine

## 2017-03-14 ENCOUNTER — Emergency Department
Admission: EM | Admit: 2017-03-14 | Discharge: 2017-03-14 | Disposition: A | Discharge status: Home / Self Care | Payer: Medicaid Other | Attending: Emergency Medicine | Admitting: Emergency Medicine

## 2017-03-14 DIAGNOSIS — S4991XA Unspecified injury of right shoulder and upper arm, initial encounter: Secondary | ICD-10-CM | POA: Diagnosis present

## 2017-03-14 DIAGNOSIS — M25512 Pain in left shoulder: Secondary | ICD-10-CM | POA: Diagnosis not present

## 2017-03-14 DIAGNOSIS — F17210 Nicotine dependence, cigarettes, uncomplicated: Secondary | ICD-10-CM | POA: Insufficient documentation

## 2017-03-14 DIAGNOSIS — Y999 Unspecified external cause status: Secondary | ICD-10-CM | POA: Insufficient documentation

## 2017-03-14 DIAGNOSIS — Y939 Activity, unspecified: Secondary | ICD-10-CM | POA: Insufficient documentation

## 2017-03-14 DIAGNOSIS — Y92410 Unspecified street and highway as the place of occurrence of the external cause: Secondary | ICD-10-CM | POA: Insufficient documentation

## 2017-03-14 MED ORDER — ACETAMINOPHEN 325 MG PO TABS
650.0000 mg | ORAL_TABLET | Freq: Once | ORAL | Status: AC
  Administered 2017-03-14: 650 mg via ORAL
  Filled 2017-03-14: qty 2

## 2017-03-14 NOTE — ED Notes (Signed)
Right arm pain. No obvious injury. +2
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
## 2017-03-14 NOTE — ED Notes (Signed)
Pt. Verbalizes understanding of d/c instructions and follow-up. VS stable and pain controlled per pt.  Pt. In NAD at time of d/c and denies further concerns regarding this visit. Pt. Stable at the time of departure from the unit, departing unit by the safest and most appropriate manner per that pt condition and limitations. Pt advised to return to the ED at any time for emergent concerns, or for new/worsening symptoms.   
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
## 2017-03-16 ENCOUNTER — Ambulatory Visit (INDEPENDENT_AMBULATORY_CARE_PROVIDER_SITE_OTHER): Payer: Medicaid Other

## 2017-03-16 ENCOUNTER — Ambulatory Visit: Payer: Medicaid Other | Admitting: Obstetrics and Gynecology

## 2017-03-16 DIAGNOSIS — F458 Other somatoform disorders: Secondary | ICD-10-CM | POA: Diagnosis not present

## 2017-03-16 DIAGNOSIS — N911 Secondary amenorrhea: Secondary | ICD-10-CM | POA: Diagnosis not present

## 2017-03-17 ENCOUNTER — Ambulatory Visit
Admission: RE | Admit: 2017-03-17 | Discharge: 2017-03-17 | Disposition: A | Discharge status: Home / Self Care | Payer: Medicaid Other | Source: Ambulatory Visit | Attending: Chiropractor | Admitting: Chiropractor

## 2017-03-17 ENCOUNTER — Other Ambulatory Visit: Payer: Self-pay | Admitting: Chiropractor

## 2017-03-17 ENCOUNTER — Ambulatory Visit
Admission: RE | Admit: 2017-03-17 | Discharge: 2017-03-17 | Disposition: A | Discharge status: Home / Self Care | Payer: No Typology Code available for payment source | Source: Ambulatory Visit | Attending: Chiropractor | Admitting: Chiropractor

## 2017-03-17 DIAGNOSIS — M546 Pain in thoracic spine: Secondary | ICD-10-CM | POA: Diagnosis present

## 2017-03-17 DIAGNOSIS — M545 Low back pain: Secondary | ICD-10-CM

## 2017-03-17 DIAGNOSIS — M549 Dorsalgia, unspecified: Secondary | ICD-10-CM

## 2017-03-17 DIAGNOSIS — M5382 Other specified dorsopathies, cervical region: Secondary | ICD-10-CM | POA: Insufficient documentation

## 2017-03-17 DIAGNOSIS — R51 Headache: Secondary | ICD-10-CM | POA: Diagnosis not present

## 2017-03-17 DIAGNOSIS — M5416 Radiculopathy, lumbar region: Secondary | ICD-10-CM | POA: Insufficient documentation

## 2017-03-17 DIAGNOSIS — M542 Cervicalgia: Secondary | ICD-10-CM | POA: Diagnosis present

## 2017-03-24 ENCOUNTER — Emergency Department
Admission: EM | Admit: 2017-03-24 | Discharge: 2017-03-24 | Disposition: A | Discharge status: Home / Self Care | Payer: Medicaid Other | Attending: Emergency Medicine | Admitting: Emergency Medicine

## 2017-03-24 ENCOUNTER — Encounter: Payer: Self-pay | Admitting: Emergency Medicine

## 2017-03-24 DIAGNOSIS — Z5321 Procedure and treatment not carried out due to patient leaving prior to being seen by health care provider: Secondary | ICD-10-CM | POA: Diagnosis not present

## 2017-03-24 DIAGNOSIS — G43909 Migraine, unspecified, not intractable, without status migrainosus: Secondary | ICD-10-CM | POA: Diagnosis not present

## 2017-03-24 DIAGNOSIS — F17210 Nicotine dependence, cigarettes, uncomplicated: Secondary | ICD-10-CM | POA: Insufficient documentation

## 2017-03-24 NOTE — ED Triage Notes (Signed)
Pt in with co migraine for 2 days, has taken ibuprofen and tylenol for the same. Has hx of the same, now is light sensitive and nauseous.

## 2017-03-25 ENCOUNTER — Telehealth: Payer: Self-pay | Admitting: Emergency Medicine

## 2017-04-10 IMAGING — CR DG FOOT COMPLETE 3+V*L*
3 series · 3 of 3 positions shown · non-contrast
Comparison: None.

CLINICAL DATA: 29-year-old female with left foot trauma and pain.

EXAM:
LEFT FOOT - COMPLETE 3+ VIEW

[foot ap]
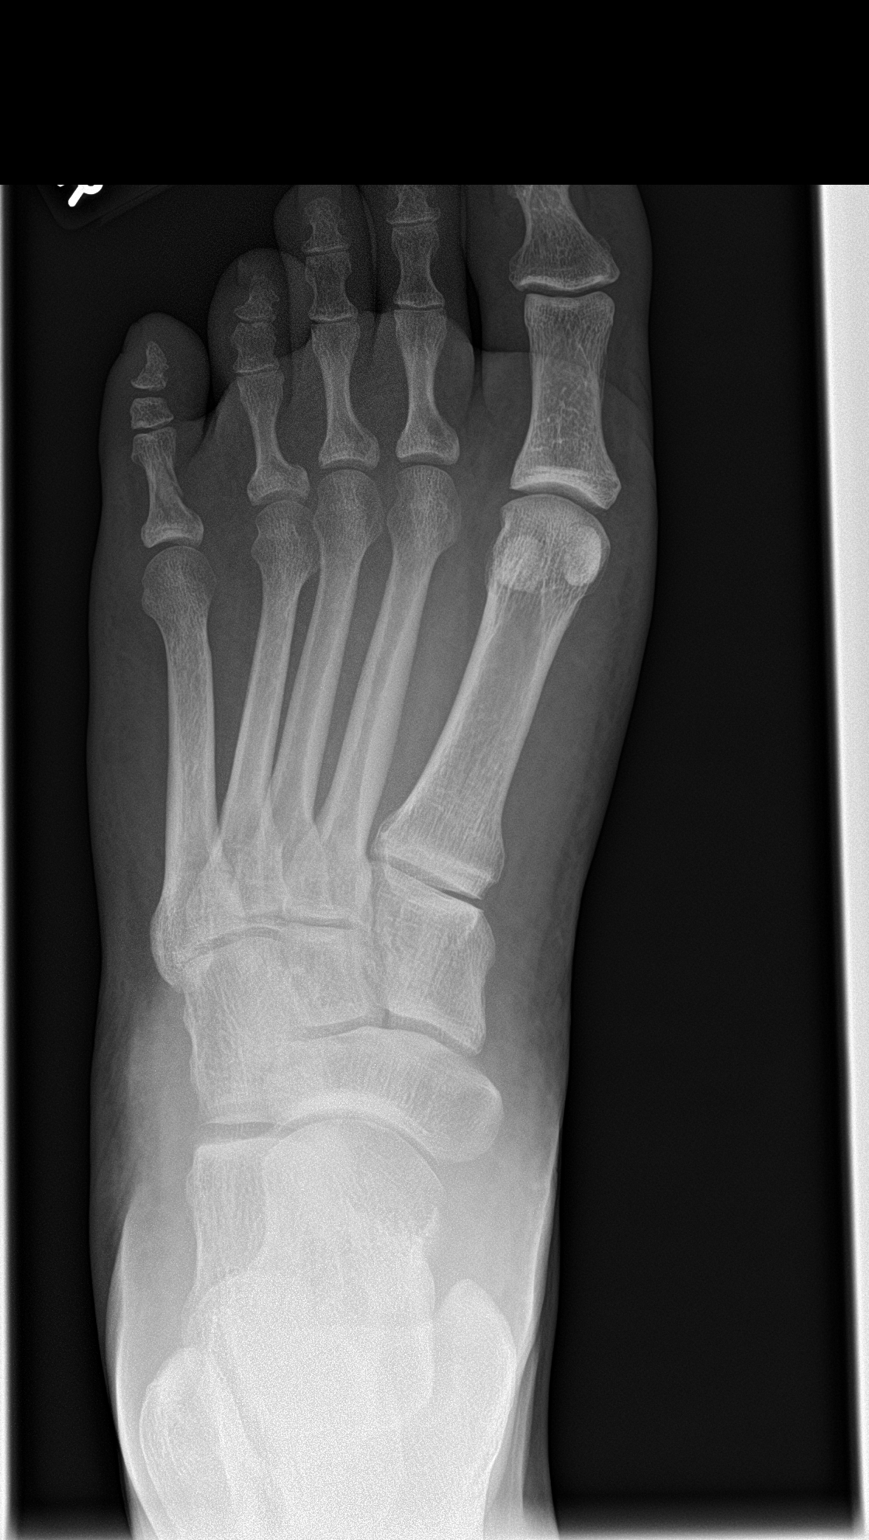

[foot obl]
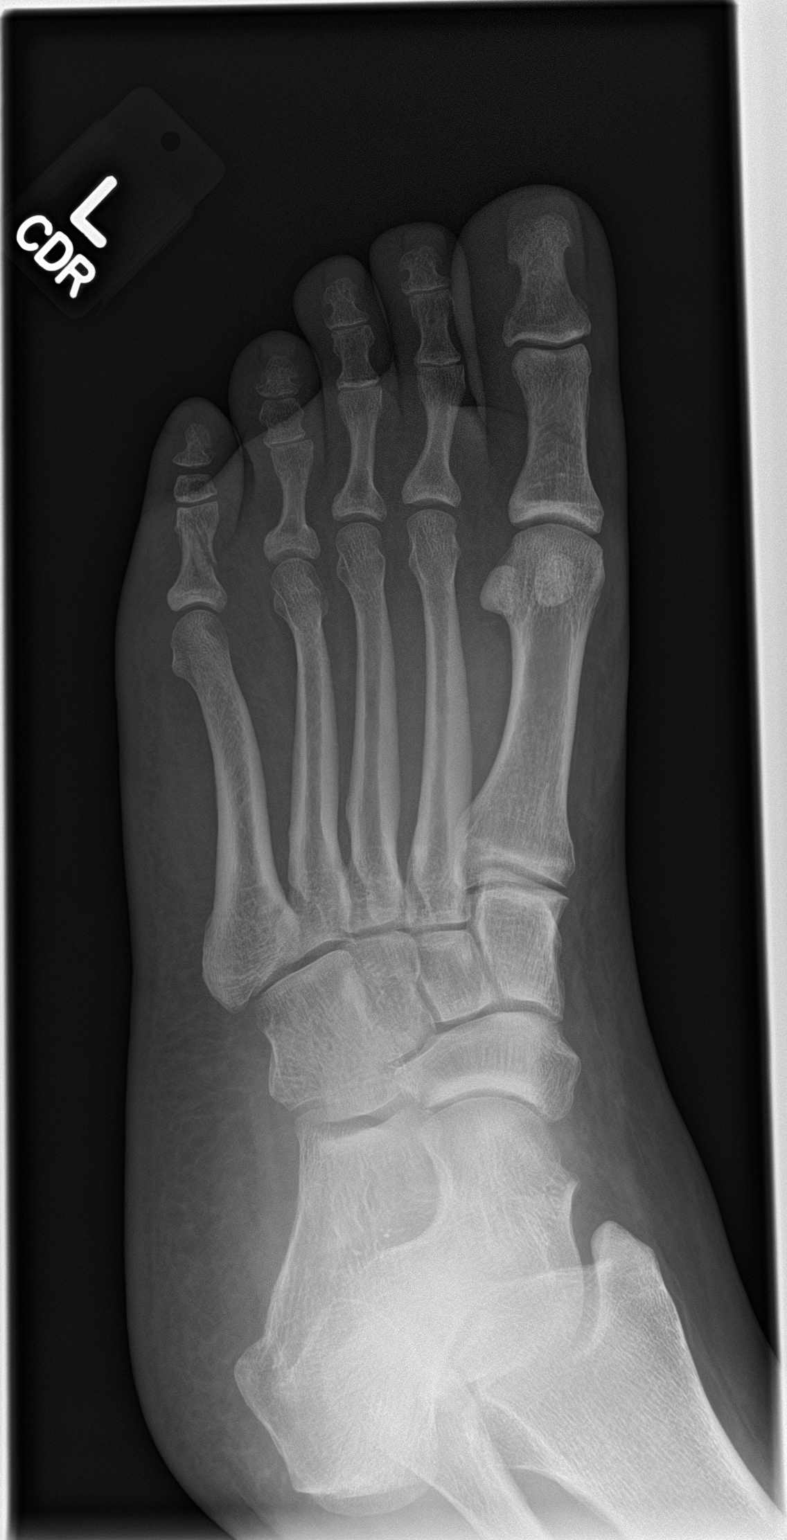

[foot lat]
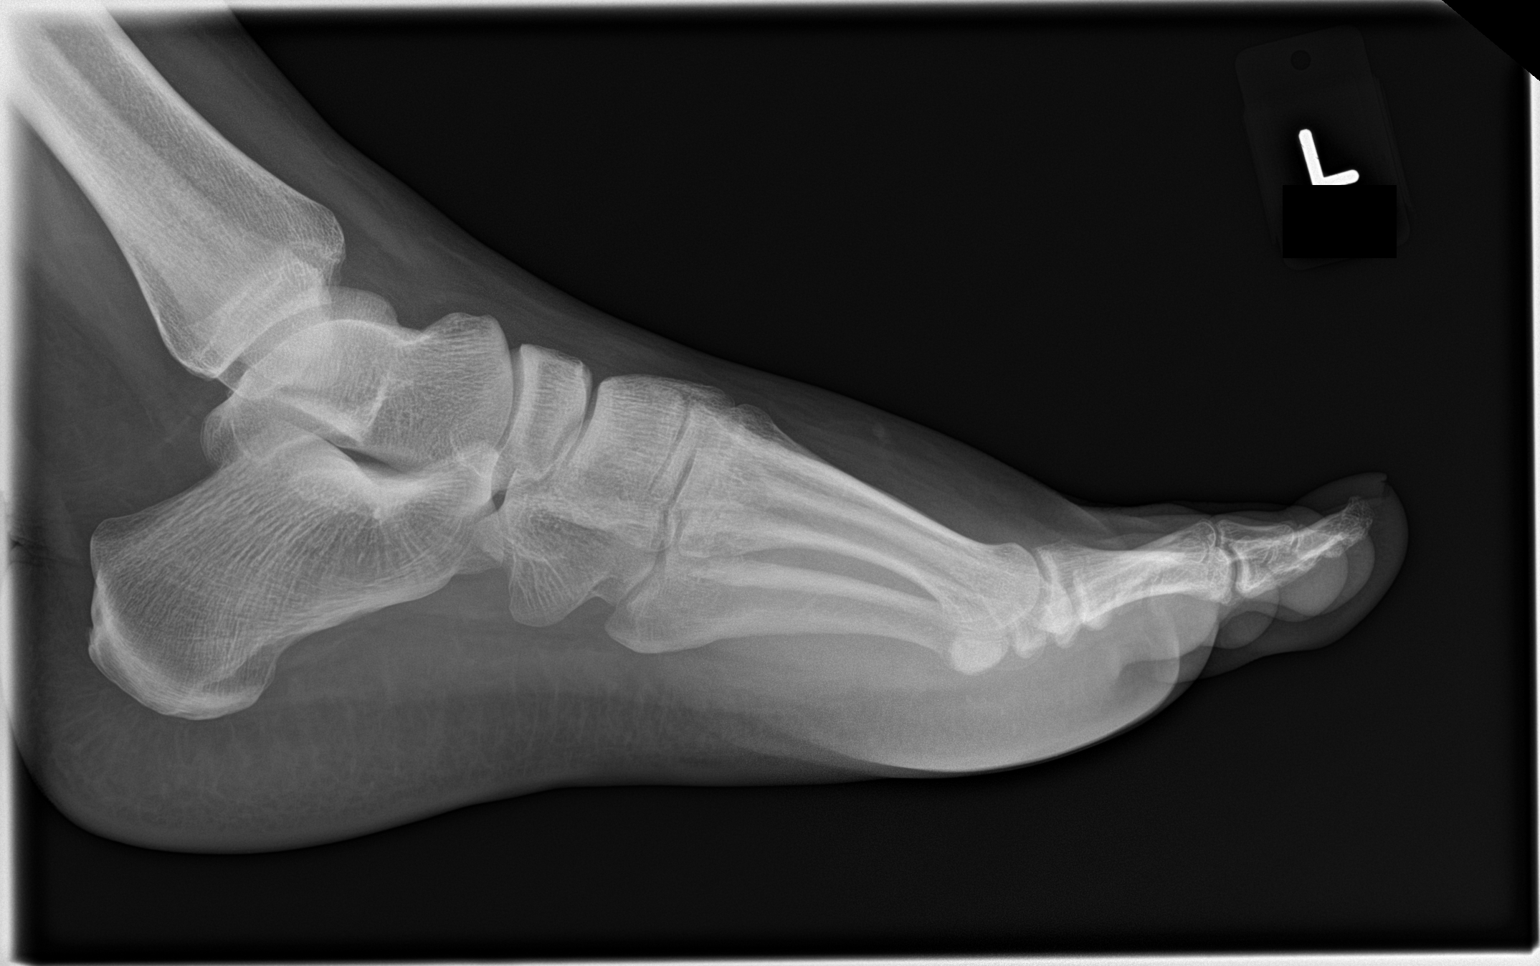

[3 of 3 positions shown; findings below may reference images not displayed]

FINDINGS: There is nondisplaced fracture of the proximal phalanx of the fifth
digit. There is mild soft tissue swelling of the fifth toe and
forefoot. No other fracture identified. No radiopaque foreign object
noted.
IMPRESSION: Nondisplaced fracture of the proximal phalanx of the fifth toe.

## 2017-06-28 ENCOUNTER — Emergency Department (HOSPITAL_COMMUNITY): Payer: Medicaid Other

## 2017-06-28 ENCOUNTER — Other Ambulatory Visit (HOSPITAL_COMMUNITY): Payer: Self-pay | Admitting: Radiology

## 2017-06-28 ENCOUNTER — Emergency Department (HOSPITAL_COMMUNITY)
Admission: EM | Admit: 2017-06-28 | Discharge: 2017-06-28 | Disposition: A | Discharge status: Home / Self Care | Payer: Medicaid Other | Attending: Emergency Medicine | Admitting: Emergency Medicine

## 2017-06-28 ENCOUNTER — Encounter (HOSPITAL_COMMUNITY): Payer: Self-pay

## 2017-06-28 DIAGNOSIS — Y9389 Activity, other specified: Secondary | ICD-10-CM | POA: Diagnosis not present

## 2017-06-28 DIAGNOSIS — M25512 Pain in left shoulder: Secondary | ICD-10-CM

## 2017-06-28 DIAGNOSIS — M79675 Pain in left toe(s): Secondary | ICD-10-CM | POA: Diagnosis not present

## 2017-06-28 DIAGNOSIS — Z79899 Other long term (current) drug therapy: Secondary | ICD-10-CM | POA: Insufficient documentation

## 2017-06-28 DIAGNOSIS — F17210 Nicotine dependence, cigarettes, uncomplicated: Secondary | ICD-10-CM | POA: Insufficient documentation

## 2017-06-28 DIAGNOSIS — Y929 Unspecified place or not applicable: Secondary | ICD-10-CM | POA: Insufficient documentation

## 2017-06-28 DIAGNOSIS — W132XXA Fall from, out of or through roof, initial encounter: Secondary | ICD-10-CM | POA: Insufficient documentation

## 2017-06-28 DIAGNOSIS — W19XXXA Unspecified fall, initial encounter: Secondary | ICD-10-CM

## 2017-06-28 DIAGNOSIS — Y999 Unspecified external cause status: Secondary | ICD-10-CM | POA: Insufficient documentation

## 2017-06-28 MED ORDER — IBUPROFEN 400 MG PO TABS: 400.0000 mg | ORAL_TABLET | Freq: Four times a day (QID) | ORAL | 0 refills | Status: DC | PRN

## 2017-06-28 MED ORDER — OXYCODONE-ACETAMINOPHEN 5-325 MG PO TABS: 1.0000 | ORAL_TABLET | Freq: Once | ORAL | Status: DC

## 2017-06-28 MED ORDER — KETOROLAC TROMETHAMINE 60 MG/2ML IM SOLN: 60.0000 mg | Freq: Once | INTRAMUSCULAR | Status: DC

## 2017-06-28 NOTE — ED Triage Notes (Signed)
Per Pt, Pt is coming from home. Pt reports falling off a one story roof yesterday after it started raining while she was on there. Pt denies hitting her head or LOC. Pt fell on her left shoulder and has left shoulder pain and left pinky toe numbness.

## 2017-06-28 NOTE — ED Provider Notes (Signed)
[facility]EMERGENCY DEPT Provider Note   CSN: [id number] Arrival date & time: 06/28/17  1301     History   Chief Complaint Chief Complaint  Patient presents with  . Fall    HPI Amy Mcfarland is a 31 y.o. female.   Fall   31 year old female that fell yesterday landing on her left foot and shoulder with pain since that time took a Percocet and it got better however the pain came back to came here for evaluation. She states the main pain is in her left fifth digit and fifth metatarsal area. No deformities that she noted. She also has pain with range of motion of her left shoulder. No rib pain or chest pain or shortness of breath. She states she has some pain to the lateral side of her neck on the left. No midline pain.  Past Medical History:  Diagnosis Date  . Kidney infection   . Kidney infection   . Migraine   . Migraines   . Miscarriage     Patient Active Problem List   Diagnosis Date Noted  . Contraceptive management 05/17/2016  . Encounter for Depo-Provera contraception 06/28/2013    Past Surgical History:  Procedure Laterality Date  . CESAREAN SECTION  2011  . CESAREAN SECTION    . CESAREAN SECTION    . DILATION AND CURETTAGE OF UTERUS  2006    OB History    Gravida Para Term Preterm AB Living   2 1 1   1 1    SAB TAB Ectopic Multiple Live Births   1       1      Obstetric Comments   LMP 12/25/14       Home Medications    Prior to Admission medications   Medication Sig Start Date End Date Taking? Authorizing Provider  ibuprofen (ADVIL,MOTRIN) 400 MG tablet Take 1 tablet (400 mg total) by mouth every 6 (six) hours as needed. 06/28/17   Peregrine Nolt, Barbara Cower, MD  SUMAtriptan (IMITREX) 25 MG tablet Take 25 mg by mouth every 2 (two) hours as needed for migraine. May repeat in 2 hours if headache persists or recurs.    [provider]    Family History Family History  Problem Relation Age of Onset  . Diabetes Mother   . Hypertension Father   .  Diabetes Father   . Asthma Maternal Uncle   . Cancer Maternal Grandmother        breast cancer  . Cancer Paternal Grandmother     Social History Social History  Substance Use Topics  . Smoking status: Current Some Day Smoker    Packs/day: 0.25    Years: 7.00    Types: Cigarettes  . Smokeless tobacco: Never Used  . Alcohol use Yes     Comment: occasionally     Allergies   Kiwi extract; Peach [prunus persica]; Strawberry extract; and Vicodin [hydrocodone-acetaminophen]   Review of Systems Review of Systems  All other systems reviewed and are negative.    Physical Exam Updated Vital Signs BP (!) 127/59 (BP Location: Right Arm)   Pulse 65   Temp 97.9 F (36.6 C) (Oral)   Resp 18   Ht 5' 6.5" (1.689 m)   Wt 112.9 kg (249 lb)   LMP 06/17/2017   SpO2 100%   BMI 39.59 kg/m   Physical Exam  Constitutional: She is oriented to person, place, and time. She appears well-developed and well-nourished.  HENT:  Head: Normocephalic and atraumatic.  Eyes: Conjunctivae  and EOM are normal.  Neck: Normal range of motion.  Cardiovascular: Normal rate and regular rhythm.   Pulmonary/Chest: Effort normal and breath sounds normal. No stridor. No respiratory distress.  Abdominal: Soft. She exhibits no distension.  Musculoskeletal: Normal range of motion. She exhibits tenderness (left small toe and MTP joint, also with some tenderness with ROM of left shoulder but normal strength). She exhibits no edema or deformity.  Neurological: She is alert and oriented to person, place, and time.  Skin: Skin is warm and dry.  Nursing note and vitals reviewed.    ED Treatments / Results  Labs (all labs ordered are listed, but only abnormal results are displayed) Labs Reviewed - No data to display  EKG  EKG Interpretation None       Radiology Dg Shoulder Left  Result Date: 06/28/2017 CLINICAL DATA:  Status post fall from a 1 story roof yesterday striking the left shoulder. Persistent  left shoulder pain. EXAM: LEFT SHOULDER - 2+ VIEW COMPARISON:  None in PACs. FINDINGS: The bones of the left shoulder are subjectively adequately mineralized. The glenohumeral joint space is well maintained. There is narrowing of the AC joint. The subacromial subdeltoid space is normal. The observed portions of the left clavicle and upper left ribs are normal. IMPRESSION: There is no acute or significant chronic bony abnormality of the left shoulder. Electronically Signed   By: David  SwazilandJordan M.D.   On: 06/28/2017 14:08   Dg Foot Complete Left  Result Date: 06/28/2017 CLINICAL DATA:  Left pinky toe pain. EXAM: LEFT FOOT - COMPLETE 3+ VIEW COMPARISON:  Left foot x-rays dated November 13, 2015. FINDINGS: There is no evidence of fracture or dislocation. There is no evidence of arthropathy or other focal bone abnormality. Soft tissues are unremarkable. IMPRESSION: Negative. Electronically Signed   By: Obie DredgeWilliam T Derry M.D.   On: 06/28/2017 14:08    Procedures Procedures (including critical care time)  Medications Ordered in ED Medications  oxyCODONE-acetaminophen (PERCOCET/ROXICET) 5-325 MG per tablet 1 tablet (not administered)  ketorolac (TORADOL) injection 60 mg (not administered)     Initial Impression / Assessment and Plan / ED Course  I have reviewed the triage vital signs and the nursing notes.  Pertinent labs & imaging results that were available during my care of the patient were reviewed by me and considered in my medical decision making (see chart for details).     No fractures or concern for significant injuries. Requesting more percocet. Offered other supportive care, not interested. Left without discharge papers, ambulated without difficulty.   Final Clinical Impressions(s) / ED Diagnoses   Final diagnoses:  Fall, initial encounter  Acute pain of left shoulder  Pain of toe of left foot    New Prescriptions New Prescriptions   IBUPROFEN (ADVIL,MOTRIN) 400 MG TABLET    Take  1 tablet (400 mg total) by mouth every 6 (six) hours as needed.     Marily MemosMesner, Deen Deguia, MD 06/28/17 [id number]

## 2017-06-28 NOTE — ED Notes (Signed)
Patient muttering under her breath while in hall bed, stating she's taken enough ibuprofen and taping toes won't do anything. RN stated that once I finished talking with the MD on the phone, I would grab her medications and d/c papers. Patient then stood up and stated that she would discharge herself and she didn't need the papers. RN attempted to encourage patient to stay for her medications, and she replied that she was leaving now. Patient stood up and left, ambulatory with steady gait, no difficulties. MD aware.

## 2017-06-28 NOTE — ED Notes (Signed)
Dr. Clayborne DanaMesner at bedside, assessing patient at this time.

## 2017-07-02 ENCOUNTER — Other Ambulatory Visit: Payer: Self-pay | Admitting: Emergency Medicine

## 2017-08-08 ENCOUNTER — Telehealth: Payer: Self-pay | Admitting: Emergency Medicine

## 2017-08-08 ENCOUNTER — Emergency Department: Payer: Medicaid Other

## 2017-08-08 ENCOUNTER — Emergency Department
Admission: EM | Admit: 2017-08-08 | Discharge: 2017-08-08 | Disposition: A | Discharge status: Home / Self Care | Payer: Medicaid Other | Attending: Emergency Medicine | Admitting: Emergency Medicine

## 2017-08-08 DIAGNOSIS — R05 Cough: Secondary | ICD-10-CM | POA: Diagnosis not present

## 2017-08-08 DIAGNOSIS — Z5321 Procedure and treatment not carried out due to patient leaving prior to being seen by health care provider: Secondary | ICD-10-CM | POA: Insufficient documentation

## 2017-08-08 DIAGNOSIS — R079 Chest pain, unspecified: Secondary | ICD-10-CM | POA: Diagnosis present

## 2017-08-08 LAB — CBC
HEMATOCRIT: 40.6 % (ref 35.0–47.0)
Hemoglobin: 13.7 g/dL (ref 12.0–16.0)
MCH: 29.6 pg (ref 26.0–34.0)
MCHC: 33.9 g/dL (ref 32.0–36.0)
MCV: 87.4 fL (ref 80.0–100.0)
PLATELETS: 209 10*3/uL (ref 150–440)
RBC: 4.64 MIL/uL (ref 3.80–5.20)
RDW: 14.1 % (ref 11.5–14.5)
WBC: 11.8 10*3/uL — AB (ref 3.6–11.0)

## 2017-08-08 LAB — BASIC METABOLIC PANEL
Anion gap: 5 (ref 5–15)
BUN: 10 mg/dL (ref 6–20)
CHLORIDE: 108 mmol/L (ref 101–111)
CO2: 24 mmol/L (ref 22–32)
CREATININE: 0.92 mg/dL (ref 0.44–1.00)
Calcium: 8.9 mg/dL (ref 8.9–10.3)
GFR calc Af Amer: >60 mL/min (ref >60)
GFR calc non Af Amer: >60 mL/min (ref >60)
GLUCOSE: 95 mg/dL (ref 65–99)
Potassium: 3.3 mmol/L — ABNORMAL LOW (ref 3.5–5.1)
SODIUM: 137 mmol/L (ref 135–145)

## 2017-08-08 LAB — TROPONIN I: Troponin I: <0.03 ng/mL (ref <0.03)

## 2017-08-08 LAB — POCT PREGNANCY, URINE: Preg Test, Ur: NEGATIVE

## 2017-08-08 NOTE — Telephone Encounter (Signed)
Called patient due to lwot to inquire about condition and follow up plans. Number not in service. 

## 2017-08-08 NOTE — ED Notes (Signed)
Patient transported to X-ray 

## 2017-08-08 NOTE — ED Triage Notes (Signed)
Patient c/o left chest pain radiating to throat. Pt describes pain as sharp and reports concurrent symptoms of SOB, weakness.   Patient c/o nasal congestion/drainage, and productive cough with yellow sputum.    Patient c/o left lower dental pain.

## 2017-08-14 DIAGNOSIS — J029 Acute pharyngitis, unspecified: Secondary | ICD-10-CM | POA: Diagnosis not present

## 2017-08-14 DIAGNOSIS — Z5321 Procedure and treatment not carried out due to patient leaving prior to being seen by health care provider: Secondary | ICD-10-CM | POA: Diagnosis not present

## 2017-08-14 NOTE — ED Triage Notes (Signed)
Pt states is here for a sore throat that began today. Pt denies fever. Pt states she also has nasal congestion.

## 2017-08-14 NOTE — ED Notes (Signed)
Pt states she is here for a sore throat that started today.

## 2017-08-15 ENCOUNTER — Emergency Department
Admission: EM | Admit: 2017-08-15 | Discharge: 2017-08-15 | Disposition: A | Discharge status: Home / Self Care | Payer: Medicaid Other | Attending: Emergency Medicine | Admitting: Emergency Medicine

## 2017-08-15 LAB — POCT RAPID STREP A: Streptococcus, Group A Screen (Direct): NEGATIVE

## 2017-08-15 NOTE — ED Notes (Signed)
Pt came up to secretary while this RN in with another pt. Pt states that "I've been here long enough.Marland Kitchenibuprofen want to go". Pt informed that it is her right to leave if she wishes. Pt leaving ED at this time. MD York Cerise made aware and CN Lea.

## 2017-08-15 NOTE — ED Provider Notes (Signed)
-----------------------------------------   5:58 AM on 08/15/2017 -----------------------------------------  After waiting 6+ hours for an exam room, this patient was in the room for 20 minutes and then told her nurse she was leaving due to the long wait.  I was in another room with a patient at the time she eloped (left without being seen).   Loleta Rose, MD 08/15/17 [phone]

## 2017-08-15 NOTE — ED Notes (Signed)
Pt sleeping in lobby 

## 2017-08-15 NOTE — ED Notes (Signed)
Pt updated on delay. Pt states "this sucks, we've been here since midnight". Pt going around waiting room asking other patients how long they have been waiting."

## 2017-08-16 ENCOUNTER — Emergency Department
Admission: EM | Admit: 2017-08-16 | Discharge: 2017-08-16 | Disposition: A | Discharge status: Home / Self Care | Payer: Medicaid Other | Attending: Emergency Medicine | Admitting: Emergency Medicine

## 2017-08-16 ENCOUNTER — Encounter: Payer: Self-pay | Admitting: *Deleted

## 2017-08-16 DIAGNOSIS — Z202 Contact with and (suspected) exposure to infections with a predominantly sexual mode of transmission: Secondary | ICD-10-CM | POA: Diagnosis present

## 2017-08-16 DIAGNOSIS — F17210 Nicotine dependence, cigarettes, uncomplicated: Secondary | ICD-10-CM | POA: Insufficient documentation

## 2017-08-16 MED ORDER — CEFTRIAXONE SODIUM 250 MG IJ SOLR
250.0000 mg | Freq: Once | INTRAMUSCULAR | Status: AC
  Administered 2017-08-16: 250 mg via INTRAMUSCULAR
  Filled 2017-08-16: qty 250

## 2017-08-16 MED ORDER — AZITHROMYCIN 500 MG PO TABS
1000.0000 mg | ORAL_TABLET | Freq: Once | ORAL | Status: AC
  Administered 2017-08-16: 1000 mg via ORAL
  Filled 2017-08-16: qty 2

## 2017-08-16 NOTE — ED Notes (Signed)
Her for STD treatment, boyfriend recently treated here. Pt denies any symptoms.

## 2017-08-16 NOTE — ED Provider Notes (Signed)
[facility] Emergency Department Provider Note   ____________________________________________   I have reviewed the triage vital signs and the nursing notes.   HISTORY  Chief Complaint SEXUALLY TRANSMITTED DISEASE    HPI Amy Mcfarland is a 31 y.o. female Presents to emergency department with concerns for contracting a sexually transmitted infection from her boyfriend. She states her boyfriend was recently treated here for sexual transmitted infection. He informed her of the infection and treatment and advised her to also be treated. Patient reports only mild burning and urinary or vaginal discharge. Patient reports her boyfriend is her only partner. Patient denies any past history of STIs. Patient denies fever, chills, headache, vision changes, chest pain, chest tightness, shortness of breath, abdominal pain, nausea and vomiting.  Past Medical History:  Diagnosis Date  . Kidney infection   . Kidney infection   . Migraine   . Migraines   . Miscarriage     Patient Active Problem List   Diagnosis Date Noted  . Contraceptive management 05/17/2016  . Encounter for Depo-Provera contraception 06/28/2013    Past Surgical History:  Procedure Laterality Date  . CESAREAN SECTION  2011  . CESAREAN SECTION    . CESAREAN SECTION    . DILATION AND CURETTAGE OF UTERUS  2006    Prior to Admission medications   Medication Sig Start Date End Date Taking? Authorizing Provider  ibuprofen (ADVIL,MOTRIN) 400 MG tablet Take 1 tablet (400 mg total) by mouth every 6 (six) hours as needed. 06/28/17   Mesner, Barbara Cower, MD  SUMAtriptan (IMITREX) 25 MG tablet Take 25 mg by mouth every 2 (two) hours as needed for migraine. May repeat in 2 hours if headache persists or recurs.    [provider]    Allergies Kiwi extract; Peach [prunus persica]; Strawberry extract; and Vicodin [hydrocodone-acetaminophen]  Family History  Problem Relation Age of Onset  .  Diabetes Mother   . Hypertension Father   . Diabetes Father   . Asthma Maternal Uncle   . Cancer Maternal Grandmother        breast cancer  . Cancer Paternal Grandmother     Social History Social History  Substance Use Topics  . Smoking status: Current Some Day Smoker    Packs/day: 0.25    Years: 7.00    Types: Cigarettes  . Smokeless tobacco: Never Used  . Alcohol use Yes     Comment: occasionally    Review of Systems Constitutional: Negative for fever/chills Cardiovascular: Denies chest pain. Respiratory: Denies cough. Denies shortness of breath. Gastrointestinal: No abdominal pain.  No nausea, vomiting, diarrhea. Genitourinary: positive for mild burning in the genitalia area. Negative for dysuria or vaginal discharge. Musculoskeletal: Negative for back pain. Skin: Negative for rash. Neurological: Negative for headaches.  ____________________________________________   PHYSICAL EXAM:  VITAL SIGNS: ED Triage Vitals  Enc Vitals Group     BP 08/16/17 1203 119/69     Pulse Rate 08/16/17 1203 92     Resp 08/16/17 1203 16     Temp 08/16/17 1203 98.2 F (36.8 C)     Temp Source 08/16/17 1203 Oral     SpO2 08/16/17 1203 100 %     Weight 08/16/17 1204 249 lb (112.9 kg)     Height 08/16/17 1204  (1.676 m)     Head Circumference --      Peak Flow --      Pain Score 08/16/17 1203 0     Pain Loc --  Pain Edu? --      Excl. in GC? --     Constitutional: Alert and oriented. Well appearing and in no acute distress.  Cardiovascular: Normal rate, regular rhythm. Good peripheral circulation. Respiratory: Normal respiratory effort without tachypnea or retractions. Cardiovascular: Normal rate, regular rhythm. Normal distal pulses. Gastrointestinal: Bowel sounds 4 quadrants. Soft and nontender to palpation.  Genitourinary: positive for mild burning in the genitalia area. Negative for dysuria or vaginal discharge. Musculoskeletal: Nontender with normal range of motion  in all extremities. Neurologic: Normal speech and language.  Skin:  Skin is warm, dry and intact. No rash noted. Psychiatric: Mood and affect are normal. Speech and behavior are normal. Patient exhibits appropriate insight and judgement.  ____________________________________________   LABS (all labs ordered are listed, but only abnormal results are displayed)  Labs Reviewed - No data to display ____________________________________________  EKG none ____________________________________________  RADIOLOGY none ____________________________________________   PROCEDURES  Procedure(s) performed: no    Critical Care performed: no ____________________________________________   INITIAL IMPRESSION / ASSESSMENT AND PLAN / ED COURSE  Pertinent labs & imaging results that were available during my care of the patient were reviewed by me and considered in my medical decision making (see chart for details).   Patient presents to emergency department with concerns of contracting sexually transmitted disease from her sexual partner. History, physical exam findings were unremarkable and patient was empirically treated for chlamydia and gonorrhea at her request. Patient tolerated medications without complication. Patient advised to follow up with PCP as needed or return to the emergency department if symptoms return or worsen. Patient informed of clinical course, understand medical decision-making process, and agree with plan. ____________________________________________   FINAL CLINICAL IMPRESSION(S) / ED DIAGNOSES  Final diagnoses:  Exposure to sexually transmitted disease (STD)       NEW MEDICATIONS STARTED DURING THIS VISIT:  New Prescriptions   No medications on file     Note:  This document was prepared using Dragon voice recognition software and may include unintentional dictation errors.    Clois Comber, PA-C 08/16/17 1349    Jene Every, MD 08/16/17  [phone]

## 2017-08-16 NOTE — ED Triage Notes (Signed)
Pt was sent by her partner to have STI testing/treatment. He was seen last week and received "a shot and some pills" Pt is asymptomatic

## 2017-08-16 NOTE — Discharge Instructions (Signed)
Follow-up with her primary care as needed if you notice any worsening symptoms do not hesitate to return to emergency department.

## 2017-08-25 ENCOUNTER — Encounter: Payer: Self-pay | Admitting: Medical Oncology

## 2017-08-25 ENCOUNTER — Emergency Department
Admission: EM | Admit: 2017-08-25 | Discharge: 2017-08-25 | Disposition: A | Discharge status: Home / Self Care | Payer: Medicaid Other | Attending: Emergency Medicine | Admitting: Emergency Medicine

## 2017-08-25 ENCOUNTER — Emergency Department: Payer: Medicaid Other

## 2017-08-25 DIAGNOSIS — F17210 Nicotine dependence, cigarettes, uncomplicated: Secondary | ICD-10-CM | POA: Insufficient documentation

## 2017-08-25 DIAGNOSIS — M79674 Pain in right toe(s): Secondary | ICD-10-CM | POA: Diagnosis present

## 2017-08-25 MED ORDER — MELOXICAM 7.5 MG PO TABS: 7.5000 mg | ORAL_TABLET | Freq: Every day | ORAL | 1 refills | Status: AC

## 2017-08-25 MED ORDER — NAPROXEN 500 MG PO TABS
500.0000 mg | ORAL_TABLET | Freq: Once | ORAL | Status: AC
  Administered 2017-08-25: 500 mg via ORAL
  Filled 2017-08-25: qty 1
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
## 2017-10-01 ENCOUNTER — Encounter (HOSPITAL_COMMUNITY): Payer: Self-pay | Admitting: Emergency Medicine

## 2017-10-01 ENCOUNTER — Other Ambulatory Visit: Payer: Self-pay

## 2017-10-01 ENCOUNTER — Ambulatory Visit (HOSPITAL_COMMUNITY)
Admission: EM | Admit: 2017-10-01 | Discharge: 2017-10-01 | Disposition: A | Discharge status: Home / Self Care | Payer: Medicaid Other | Attending: Family Medicine | Admitting: Family Medicine

## 2017-10-01 DIAGNOSIS — K0889 Other specified disorders of teeth and supporting structures: Secondary | ICD-10-CM

## 2017-10-01 MED ORDER — HYDROCODONE-ACETAMINOPHEN 5-325 MG PO TABS: 1.0000 | ORAL_TABLET | Freq: Four times a day (QID) | ORAL | 0 refills | Status: DC | PRN

## 2017-10-01 MED ORDER — AMOXICILLIN 875 MG PO TABS: 875.0000 mg | ORAL_TABLET | Freq: Two times a day (BID) | ORAL | 0 refills | Status: AC

## 2017-10-01 NOTE — Discharge Instructions (Signed)
Please call your dentist on Monday if not improving.

## 2017-10-01 NOTE — ED Provider Notes (Signed)
[facility]   [id number] 10/01/17 Arrival Time: 1205  ASSESSMENT & PLAN:  1. Pain, dental     Meds ordered this encounter  Medications  . amoxicillin (AMOXIL) 875 MG tablet    Sig: Take 1 tablet (875 mg total) 2 (two) times daily for 10 days by mouth.    Dispense:  20 tablet    Refill:  0  . HYDROcodone-acetaminophen (NORCO/VICODIN) 5-325 MG tablet    Sig: Take 1 tablet every 6 (six) hours as needed by mouth for moderate pain or severe pain.    Dispense:  10 tablet    Refill:  0    [place] Controlled Substances Registry consulted for this patient. I feel the risk/benefit ratio today is favorable for proceeding with this prescription for a controlled substance. Medication sedation precautions given.  She will call her dentist Monday morning if not improving.  Reviewed expectations re: course of current medical issues. Questions answered. Outlined signs and symptoms indicating need for more acute intervention. Patient verbalized understanding. After Visit Summary given.   SUBJECTIVE:  Amy StallJasmine L Samara is a [age] y.o. female who reports gradual onset of bilateral lower dental pain. Present for 6-7 days. Scheduled to have lower molars removed. Afebrile. Tolerating PO intake but painful with chewing. OTC analgesics without much relief.  ROS: As per HPI.  OBJECTIVE:  Vitals:   10/01/17 1241  BP: 119/74  Pulse: 71  Resp: (!) 22  Temp: 98.3 F (36.8 C)  TempSrc: Oral  SpO2: 100%    General appearance: alert; no distress HENT: normocephalic; atraumatic; dentition: fair; gingival hypertrophy over bilateral lower teeth; some tenderness; no fluctuance Neck: supple without LAD Lungs: normal respirations Skin: warm and dry Psychological: alert and cooperative; normal mood and affect   Allergies  Allergen Reactions  . Kiwi Extract Other (See Comments)    Makes mouth feel hairy  . Peach [Prunus Persica] Other (See Comments)    Makes mouth feel hairy  . Strawberry  Extract Other (See Comments)    Makes mouth feel like it has hair in it  . Vicodin [Hydrocodone-Acetaminophen] Swelling    Past Medical History:  Diagnosis Date  . Kidney infection   . Kidney infection   . Migraine   . Migraines   . Miscarriage    Social History   Socioeconomic History  . Marital status: Single    Spouse name: Not on file  . Number of children: Not on file  . Years of education: Not on file  . Highest education level: Not on file  Social Needs  . Financial resource strain: Not on file  . Food insecurity - worry: Not on file  . Food insecurity - inability: Not on file  . Transportation needs - medical: Not on file  . Transportation needs - non-medical: Not on file  Occupational History  . Not on file  Tobacco Use  . Smoking status: Current Some Day Smoker    Packs/day: 0.25    Years: 7.00    Pack years: 1.75    Types: Cigarettes  . Smokeless tobacco: Never Used  Substance and Sexual Activity  . Alcohol use: Yes    Comment: occasionally  . Drug use: No    Comment: Quit Marijuana 01/2017  . Sexual activity: Yes    Birth control/protection: None  Other Topics Concern  . Not on file  Social History Narrative  . Not on file   Family History  Problem Relation Age of Onset  . Diabetes Mother   .  Hypertension Father   . Diabetes Father   . Asthma Maternal Uncle   . Cancer Maternal Grandmother        breast cancer  . Cancer Paternal Grandmother    Past Surgical History:  Procedure Laterality Date  . CESAREAN SECTION  2011  . CESAREAN SECTION    . CESAREAN SECTION    . DILATION AND CURETTAGE OF UTERUS  2006     Mardella LaymanHagler, Tradarius Reinwald, MD 10/05/17 1021

## 2017-10-01 NOTE — ED Triage Notes (Signed)
Pain for a week.  Patient is having teeth removed January 3.  Bilateral jaw pain, and pain into left ear.

## 2017-10-29 ENCOUNTER — Emergency Department
Admission: EM | Admit: 2017-10-29 | Discharge: 2017-10-29 | Disposition: A | Discharge status: Home / Self Care | Payer: Medicaid Other | Attending: Emergency Medicine | Admitting: Emergency Medicine

## 2017-10-29 ENCOUNTER — Encounter: Payer: Self-pay | Admitting: Emergency Medicine

## 2017-10-29 ENCOUNTER — Other Ambulatory Visit: Payer: Self-pay

## 2017-10-29 DIAGNOSIS — R05 Cough: Secondary | ICD-10-CM | POA: Diagnosis present

## 2017-10-29 DIAGNOSIS — J069 Acute upper respiratory infection, unspecified: Secondary | ICD-10-CM

## 2017-10-29 DIAGNOSIS — F17210 Nicotine dependence, cigarettes, uncomplicated: Secondary | ICD-10-CM | POA: Diagnosis not present

## 2017-10-29 MED ORDER — AMOXICILLIN 500 MG PO CAPS: 500.0000 mg | ORAL_CAPSULE | Freq: Three times a day (TID) | ORAL | 0 refills | Status: DC

## 2017-10-29 NOTE — ED Notes (Signed)
Pt c/o chills, denies fever. Pt in no acute respiratory distress at this time. Pt c/o sinus congestion, cough, and runny nose. Pt cough is bronchitic.

## 2017-10-29 NOTE — ED Provider Notes (Signed)
[facility] Emergency Department Provider Note  ____________________________________________   First MD Initiated Contact with Patient 10/29/17 1847     (approximate)  I have reviewed the triage vital signs and the nursing notes.   HISTORY  Chief Complaint Sore Throat and Cough    HPI Amy Mcfarland is a 31 y.o. female complains of sinus congestion, cough, and runny nose, states her throat is sore, she denies chest pain or shortness of breath, symptoms have been for several days, she is used over-the-counter cough medicine without any relief  Past Medical History:  Diagnosis Date  . Kidney infection   . Kidney infection   . Migraine   . Migraines   . Miscarriage     Patient Active Problem List   Diagnosis Date Noted  . Contraceptive management 05/17/2016  . Encounter for Depo-Provera contraception 06/28/2013    Past Surgical History:  Procedure Laterality Date  . CESAREAN SECTION  2011  . CESAREAN SECTION    . CESAREAN SECTION    . DILATION AND CURETTAGE OF UTERUS  2006    Prior to Admission medications   Medication Sig Start Date End Date Taking? Authorizing Provider  amoxicillin (AMOXIL) 500 MG capsule Take 1 capsule (500 mg total) by mouth 3 (three) times daily. 10/29/17   Sherrie MustacheFisher, Roselyn BeringSusan W, PA-C  HYDROcodone-acetaminophen (NORCO/VICODIN) 5-325 MG tablet Take 1 tablet every 6 (six) hours as needed by mouth for moderate pain or severe pain. 10/01/17   Mardella LaymanHagler, Brian, MD  ibuprofen (ADVIL,MOTRIN) 400 MG tablet Take 1 tablet (400 mg total) by mouth every 6 (six) hours as needed. 06/28/17   Mesner, Barbara CowerJason, MD  SUMAtriptan (IMITREX) 25 MG tablet Take 25 mg by mouth every 2 (two) hours as needed for migraine. May repeat in 2 hours if headache persists or recurs.    [provider]    Allergies Kiwi extract; Peach [prunus persica]; and Strawberry extract  Family History  Problem Relation Age of Onset  . Diabetes Mother   .  Hypertension Father   . Diabetes Father   . Asthma Maternal Uncle   . Cancer Maternal Grandmother        breast cancer  . Cancer Paternal Grandmother     Social History Social History   Tobacco Use  . Smoking status: Current Some Day Smoker    Packs/day: 0.25    Years: 7.00    Pack years: 1.75    Types: Cigarettes  . Smokeless tobacco: Never Used  Substance Use Topics  . Alcohol use: Yes    Comment: occasionally  . Drug use: No    Comment: Quit Marijuana 01/2017    Review of Systems  Constitutional: No fever/chills Eyes: No visual changes. ENT: Positive sore throat.  Positive runny nose and congestion Respiratory: Positive received cough Genitourinary: Negative for dysuria. Musculoskeletal: Negative for back pain. Skin: Negative for rash.    ____________________________________________   PHYSICAL EXAM:  VITAL SIGNS: ED Triage Vitals [10/29/17 1726]  Enc Vitals Group     BP (!) 129/93     Pulse Rate 96     Resp 20     Temp 98.3 F (36.8 C)     Temp Source Oral     SpO2 97 %     Weight 240 lb (108.9 kg)     Height 5\' 6"  (1.676 m)     Head Circumference      Peak Flow      Pain Score 6  Pain Loc      Pain Edu?      Excl. in GC?     Constitutional: Alert and oriented. Well appearing and in no acute distress. Eyes: Conjunctivae are normal.  Head: Atraumatic. Nose: No congestion/rhinnorhea.  Positive for nasal mucosal swelling Mouth/Throat: Mucous membranes are moist.   Cardiovascular: Normal rate, regular rhythm.  Heart sounds are normal Respiratory: Normal respiratory effort.  No retractions, lungs are clear to auscultation GU: deferred Musculoskeletal: FROM all extremities, warm and well perfused Neurologic:  Normal speech and language.  Skin:  Skin is warm, dry and intact. No rash noted. Psychiatric: Mood and affect are normal. Speech and behavior are normal.  ____________________________________________   LABS (all labs ordered are  listed, but only abnormal results are displayed)  Labs Reviewed - No data to display ____________________________________________   ____________________________________________  RADIOLOGY    ____________________________________________   PROCEDURES  Procedure(s) performed: No      ____________________________________________   INITIAL IMPRESSION / ASSESSMENT AND PLAN / ED COURSE  Pertinent labs & imaging results that were available during my care of the patient were reviewed by me and considered in my medical decision making (see chart for details).  Patient is a [age] year old female with acute upper respiratory infection.  The quick strep was negative, patient was given a prescription for amoxicillin, she was also given a work note, she states she understands treatment plan, she was discharged in stable condition      ____________________________________________   FINAL CLINICAL IMPRESSION(S) / ED DIAGNOSES  Final diagnoses:  Acute upper respiratory infection      NEW MEDICATIONS STARTED DURING THIS VISIT:  This SmartLink is deprecated. Use AVSMEDLIST instead to display the medication list for a patient.   Note:  This document was prepared using Dragon voice recognition software and may include unintentional dictation errors.    Faythe GheeFisher, Susan W, PA-C 10/29/17 1919    Jeanmarie PlantMcShane, James A, MD 10/29/17 [id number]

## 2017-10-29 NOTE — ED Triage Notes (Signed)
Sore throat and cough x 4 days.  ?

## 2017-10-29 NOTE — ED Notes (Signed)
Pt ambulatory upon discharge. Verbalized understanding of discharge instructions, prescription and follow-up care. VSS. Skin warm and dry. A&O x4.  

## 2017-10-29 NOTE — Discharge Instructions (Signed)
Follow-up with your regular doctor if you are not better in 3-5 days, use the antibiotic as prescribed, be sure to eat yogurt so you do not you have a yeast infection from the antibiotic, gargle with warm salt water as needed for pain in her throat, you may take Tylenol and Advil as needed, you may take over-the-counter cold medicines as desired, return to the emergency department if your worsening

## 2017-11-11 ENCOUNTER — Ambulatory Visit (HOSPITAL_COMMUNITY)
Admission: EM | Admit: 2017-11-11 | Discharge: 2017-11-11 | Disposition: A | Discharge status: Home / Self Care | Payer: Medicaid Other | Attending: Internal Medicine | Admitting: Internal Medicine

## 2017-11-11 ENCOUNTER — Encounter (HOSPITAL_COMMUNITY): Payer: Self-pay | Admitting: Emergency Medicine

## 2017-11-11 DIAGNOSIS — K0889 Other specified disorders of teeth and supporting structures: Secondary | ICD-10-CM | POA: Diagnosis not present

## 2017-11-11 MED ORDER — MELOXICAM 7.5 MG PO TABS: 7.5000 mg | ORAL_TABLET | Freq: Every day | ORAL | 0 refills | Status: DC

## 2017-11-11 MED ORDER — PENICILLIN V POTASSIUM 500 MG PO TABS: 500.0000 mg | ORAL_TABLET | Freq: Four times a day (QID) | ORAL | 0 refills | Status: AC

## 2017-11-11 MED ORDER — HYDROCODONE-ACETAMINOPHEN 5-325 MG PO TABS: 1.0000 | ORAL_TABLET | Freq: Four times a day (QID) | ORAL | 0 refills | Status: DC | PRN

## 2017-11-11 NOTE — ED Provider Notes (Signed)
[facility]    CSN: [id number] Arrival date & time: 11/11/17  1608     History   Chief Complaint Chief Complaint  Patient presents with  . Dental Pain    HPI David StallJasmine L Fassler is a 31 y.o. female.   31 year old female comes in for 2-week history of dental pain.  She has known cracked tooth on the left upper and lower molars.  She was seen a month ago for similar symptoms.  She has an appointment with the dentist on January 8 for treatment.  She has been taking ibuprofen with little relief.  She had leftover Norco's from last appointment that she has been taking with good relief.  Denies trouble breathing, trouble swallowing, swelling of the throat.  Denies fever, chills, night sweats.      Past Medical History:  Diagnosis Date  . Kidney infection   . Kidney infection   . Migraine   . Migraines   . Miscarriage     Patient Active Problem List   Diagnosis Date Noted  . Contraceptive management 05/17/2016  . Encounter for Depo-Provera contraception 06/28/2013    Past Surgical History:  Procedure Laterality Date  . CESAREAN SECTION  2011  . CESAREAN SECTION    . CESAREAN SECTION    . DILATION AND CURETTAGE OF UTERUS  2006    OB History    Gravida Para Term Preterm AB Living   2 1 1   1 1    SAB TAB Ectopic Multiple Live Births   1       1      Obstetric Comments   LMP 12/25/14       Home Medications    Prior to Admission medications   Medication Sig Start Date End Date Taking? Authorizing Provider  HYDROcodone-acetaminophen (NORCO/VICODIN) 5-325 MG tablet Take 1 tablet by mouth every 6 (six) hours as needed for severe pain. 11/11/17   Cathie HoopsYu, Amy V, PA-C  ibuprofen (ADVIL,MOTRIN) 400 MG tablet Take 1 tablet (400 mg total) by mouth every 6 (six) hours as needed. 06/28/17   Mesner, Barbara CowerJason, MD  meloxicam (MOBIC) 7.5 MG tablet Take 1 tablet (7.5 mg total) by mouth daily. 11/11/17   Cathie HoopsYu, Amy V, PA-C  penicillin v potassium (VEETID) 500 MG tablet Take 1  tablet (500 mg total) by mouth 4 (four) times daily for 7 days. 11/11/17 11/18/17  Belinda FisherYu, Amy V, PA-C  SUMAtriptan (IMITREX) 25 MG tablet Take 25 mg by mouth every 2 (two) hours as needed for migraine. May repeat in 2 hours if headache persists or recurs.    [provider]    Family History Family History  Problem Relation Age of Onset  . Diabetes Mother   . Hypertension Father   . Diabetes Father   . Asthma Maternal Uncle   . Cancer Maternal Grandmother        breast cancer  . Cancer Paternal Grandmother     Social History Social History   Tobacco Use  . Smoking status: Current Some Day Smoker    Packs/day: 0.25    Years: 7.00    Pack years: 1.75    Types: Cigarettes  . Smokeless tobacco: Never Used  Substance Use Topics  . Alcohol use: Yes    Comment: occasionally  . Drug use: No    Comment: Quit Marijuana 01/2017     Allergies   Kiwi extract; Peach [prunus persica]; and Strawberry extract   Review of Systems Review of Systems  Reason unable  to perform ROS: See HPI as above.     Physical Exam Triage Vital Signs ED Triage Vitals  Enc Vitals Group     BP 11/11/17 1637 115/67     Pulse Rate 11/11/17 1636 72     Resp 11/11/17 1636 16     Temp 11/11/17 1636 97.8 F (36.6 C)     Temp Source 11/11/17 1636 Oral     SpO2 11/11/17 1636 100 %     Weight 11/11/17 1637 245 lb (111.1 kg)     Height --      Head Circumference --      Peak Flow --      Pain Score 11/11/17 1637 10     Pain Loc --      Pain Edu? --      Excl. in GC? --    No data found.  Updated Vital Signs BP 115/67   Pulse 72   Temp 97.8 F (36.6 C) (Oral)   Resp 16   Wt 245 lb (111.1 kg)   LMP 11/11/2017   SpO2 100%   BMI 39.54 kg/m   Physical Exam  Constitutional: She is oriented to person, place, and time. She appears well-developed and well-nourished. No distress.  HENT:  Head: Normocephalic and atraumatic.  Mouth/Throat: Uvula is midline, oropharynx is clear and moist and  mucous membranes are normal. Abnormal dentition. Dental caries present.  Cracked tooth on left upper and lower molar.  Tenderness on palpation on surrounding gums.  Floor of mouth soft to palpation.  No facial swelling.  Eyes: Conjunctivae are normal. Pupils are equal, round, and reactive to light.  Neurological: She is alert and oriented to person, place, and time.     UC Treatments / Results  Labs (all labs ordered are listed, but only abnormal results are displayed) Labs Reviewed - No data to display  EKG  EKG Interpretation None       Radiology No results found.  Procedures Procedures (including critical care time)  Medications Ordered in UC Medications - No data to display   Initial Impression / Assessment and Plan / UC Course  I have reviewed the triage vital signs and the nursing notes.  Pertinent labs & imaging results that were available during my care of the patient were reviewed by me and considered in my medical decision making (see chart for details).    Start antibiotics for possible dental abscess.  Mobic for pain.  Patient requesting Norco for further pain relief, given patient with scheduled appointment with dentist, will refill 10 pills for severe pain.  Discussed with patient we will not be able to provide further refills.  Discussed with patient symptoms can return if dental problem is not addressed. Follow up with dentist for further evaluation and treatment of dental pain. Resources given. Return precautions given.  Patient expresses understanding and agrees to plan.   Final Clinical Impressions(s) / UC Diagnoses   Final diagnoses:  Pain, dental    ED Discharge Orders        Ordered    penicillin v potassium (VEETID) 500 MG tablet  4 times daily     11/11/17 1706    meloxicam (MOBIC) 7.5 MG tablet  Daily     11/11/17 1706    HYDROcodone-acetaminophen (NORCO/VICODIN) 5-325 MG tablet  Every 6 hours PRN     11/11/17 1706       Controlled  Substance Prescriptions [place] Controlled Substance Registry consulted? Yes, I have consulted the [place] Controlled  Substances Registry for this patient, and feel the risk/benefit ratio today is favorable for proceeding with this prescription for a controlled substance.   Belinda Fisher, PA-C 11/11/17 1712

## 2017-11-11 NOTE — Discharge Instructions (Signed)
Start Penicillin as directed for possible dental abscess. Mobic for pain and inflammation. Norco for breakthrough pain. Follow up with dentist as scheduled for further treatment and evaluation. If experiencing swelling of the throat, trouble breathing, trouble swallowing, follow up for reevaluation.

## 2017-11-11 NOTE — ED Triage Notes (Signed)
PT reports dental pain for 2 weeks.

## 2017-12-06 ENCOUNTER — Encounter: Payer: Self-pay | Admitting: Emergency Medicine

## 2017-12-06 ENCOUNTER — Emergency Department
Admission: EM | Admit: 2017-12-06 | Discharge: 2017-12-06 | Disposition: A | Discharge status: Home / Self Care | Payer: Medicaid Other | Attending: Emergency Medicine | Admitting: Emergency Medicine

## 2017-12-06 DIAGNOSIS — F17210 Nicotine dependence, cigarettes, uncomplicated: Secondary | ICD-10-CM | POA: Diagnosis not present

## 2017-12-06 DIAGNOSIS — Z79899 Other long term (current) drug therapy: Secondary | ICD-10-CM | POA: Diagnosis not present

## 2017-12-06 DIAGNOSIS — J111 Influenza due to unidentified influenza virus with other respiratory manifestations: Secondary | ICD-10-CM | POA: Diagnosis not present

## 2017-12-06 DIAGNOSIS — R6883 Chills (without fever): Secondary | ICD-10-CM | POA: Diagnosis present

## 2017-12-06 MED ORDER — OSELTAMIVIR PHOSPHATE 75 MG PO CAPS: 75.0000 mg | ORAL_CAPSULE | Freq: Two times a day (BID) | ORAL | 0 refills | Status: AC

## 2017-12-06 MED ORDER — IBUPROFEN 800 MG PO TABS: 800.0000 mg | ORAL_TABLET | Freq: Three times a day (TID) | ORAL | 0 refills | Status: DC | PRN

## 2017-12-06 MED ORDER — ONDANSETRON HCL 4 MG PO TABS: 4.0000 mg | ORAL_TABLET | Freq: Every day | ORAL | 0 refills | Status: AC | PRN

## 2017-12-06 MED ORDER — ONDANSETRON 4 MG PO TBDP
4.0000 mg | ORAL_TABLET | Freq: Once | ORAL | Status: AC
  Administered 2017-12-06: 4 mg via ORAL
  Filled 2017-12-06: qty 1

## 2017-12-06 MED ORDER — IBUPROFEN 800 MG PO TABS
800.0000 mg | ORAL_TABLET | Freq: Once | ORAL | Status: AC
  Administered 2017-12-06: 800 mg via ORAL
  Filled 2017-12-06: qty 1

## 2017-12-06 MED ORDER — ALBUTEROL SULFATE HFA 108 (90 BASE) MCG/ACT IN AERS: 2.0000 | INHALATION_SPRAY | Freq: Four times a day (QID) | RESPIRATORY_TRACT | 0 refills | Status: DC | PRN

## 2017-12-06 MED ORDER — OSELTAMIVIR PHOSPHATE 75 MG PO CAPS
75.0000 mg | ORAL_CAPSULE | Freq: Once | ORAL | Status: AC
  Administered 2017-12-06: 75 mg via ORAL
  Filled 2017-12-06: qty 1

## 2017-12-06 MED ORDER — BENZONATATE 100 MG PO CAPS: 100.0000 mg | ORAL_CAPSULE | Freq: Three times a day (TID) | ORAL | 0 refills | Status: DC | PRN

## 2017-12-06 NOTE — ED Triage Notes (Signed)
Patient with complaint of congestion, chills and body aches that started on Sunday. Patient reports temperature of 99.6 at home today.

## 2017-12-06 NOTE — ED Provider Notes (Signed)
[facility] Emergency Department Provider Note  ____________________________________________  Time seen: Approximately 9:39 PM  I have reviewed the triage vital signs and the nursing notes.   HISTORY  Chief Complaint Nasal Congestion; Generalized Body Aches; and Chills    HPI Amy Mcfarland is a 32 y.o. female that presents to the emergency department for evaluation of headache, grade fever, chills, muscle aches, nasal congestion, nonproductive cough since yesterday.  She drinking well.  She does not have an appetite.  She had an episode of diarrhea today.  She was exposed to someone with the flu over the weekend.  She smokes a pack of cigarettes per day.  No shortness of breath, chest pain, abdominal pain.  Past Medical History:  Diagnosis Date  . Kidney infection   . Kidney infection   . Migraine   . Migraines   . Miscarriage     Patient Active Problem List   Diagnosis Date Noted  . Contraceptive management 05/17/2016  . Encounter for Depo-Provera contraception 06/28/2013    Past Surgical History:  Procedure Laterality Date  . CESAREAN SECTION  2011  . DILATION AND CURETTAGE OF UTERUS  2006    Prior to Admission medications   Medication Sig Start Date End Date Taking? Authorizing Provider  albuterol (PROVENTIL HFA;VENTOLIN HFA) 108 (90 Base) MCG/ACT inhaler Inhale 2 puffs into the lungs every 6 (six) hours as needed for wheezing or shortness of breath. 12/06/17   Enid Derry, PA-C  benzonatate (TESSALON PERLES) 100 MG capsule Take 1 capsule (100 mg total) by mouth 3 (three) times daily as needed for cough. 12/06/17 12/06/18  Enid Derry, PA-C  HYDROcodone-acetaminophen (NORCO/VICODIN) 5-325 MG tablet Take 1 tablet by mouth every 6 (six) hours as needed for severe pain. 11/11/17   Cathie Hoops, Amy V, PA-C  ibuprofen (ADVIL,MOTRIN) 800 MG tablet Take 1 tablet (800 mg total) by mouth every 8 (eight) hours as needed. 12/06/17   Enid Derry, PA-C   meloxicam (MOBIC) 7.5 MG tablet Take 1 tablet (7.5 mg total) by mouth daily. 11/11/17   Cathie Hoops, Amy V, PA-C  ondansetron (ZOFRAN) 4 MG tablet Take 1 tablet (4 mg total) by mouth daily as needed for nausea or vomiting. 12/06/17 12/06/18  Enid Derry, PA-C  oseltamivir (TAMIFLU) 75 MG capsule Take 1 capsule (75 mg total) by mouth 2 (two) times daily for 5 days. 12/06/17 12/11/17  Enid Derry, PA-C  SUMAtriptan (IMITREX) 25 MG tablet Take 25 mg by mouth every 2 (two) hours as needed for migraine. May repeat in 2 hours if headache persists or recurs.    [provider]    Allergies Kiwi extract; Peach [prunus persica]; and Strawberry extract  Family History  Problem Relation Age of Onset  . Diabetes Mother   . Hypertension Father   . Diabetes Father   . Asthma Maternal Uncle   . Cancer Maternal Grandmother        breast cancer  . Cancer Paternal Grandmother     Social History Social History   Tobacco Use  . Smoking status: Current Some Day Smoker    Packs/day: 0.25    Years: 7.00    Pack years: 1.75    Types: Cigarettes  . Smokeless tobacco: Never Used  Substance Use Topics  . Alcohol use: Yes    Comment: occasionally  . Drug use: No    Comment: Quit Marijuana 01/2017     Review of Systems  Constitutional: Positive for fever. Eyes: No visual changes. No discharge. ENT:  Positive for congestion and rhinorrhea. Cardiovascular: No chest pain. Respiratory: Positive for cough. No SOB. Gastrointestinal: No abdominal pain.   Musculoskeletal: Positive for body aches. Skin: Negative for rash, abrasions, lacerations, ecchymosis.   ____________________________________________   PHYSICAL EXAM:  VITAL SIGNS: ED Triage Vitals  Enc Vitals Group     BP 12/06/17 2041 (!) 109/94     Pulse Rate 12/06/17 2039 92     Resp 12/06/17 2039 18     Temp 12/06/17 2039 99.9 F (37.7 C)     Temp Source 12/06/17 2039 Oral     SpO2 12/06/17 2039 100 %     Weight 12/06/17 2041 235  lb (106.6 kg)     Height 12/06/17 2041 5\' 6"  (1.676 m)     Head Circumference --      Peak Flow --      Pain Score 12/06/17 2040 10     Pain Loc --      Pain Edu? --      Excl. in GC? --      Constitutional: Alert and oriented. Well appearing and in no acute distress. Eyes: Conjunctivae are normal. PERRL. EOMI. No discharge. Head: Atraumatic. ENT: No frontal and maxillary sinus tenderness.      Ears: Tympanic membranes pearly gray with good landmarks. No discharge.      Nose: Mild congestion/rhinnorhea.      Mouth/Throat: Mucous membranes are moist. Oropharynx non-erythematous. Tonsils not enlarged. No exudates. Uvula midline. Neck: No stridor.   Hematological/Lymphatic/Immunilogical: No cervical lymphadenopathy. Cardiovascular: Normal rate, regular rhythm.  Good peripheral circulation. Respiratory: Normal respiratory effort without tachypnea or retractions. Lungs CTAB. Good air entry to the bases with no decreased or absent breath sounds. Gastrointestinal: Bowel sounds 4 quadrants. Soft and nontender to palpation. No guarding or rigidity. No palpable masses. No distention. Musculoskeletal: Full range of motion to all extremities. No gross deformities appreciated. Neurologic:  Normal speech and language. No gross focal neurologic deficits are appreciated.  Skin:  Skin is warm, dry and intact. No rash noted.   ____________________________________________   LABS (all labs ordered are listed, but only abnormal results are displayed)  Labs Reviewed - No data to display ____________________________________________  EKG   ____________________________________________  RADIOLOGY   No results found.  ____________________________________________    PROCEDURES  Procedure(s) performed:    Procedures    Medications  ibuprofen (ADVIL,MOTRIN) tablet 800 mg (800 mg Oral Given 12/06/17 2150)  ondansetron (ZOFRAN-ODT) disintegrating tablet 4 mg (4 mg Oral Given 12/06/17  2150)  oseltamivir (TAMIFLU) capsule 75 mg (75 mg Oral Given 12/06/17 2150)     ____________________________________________   INITIAL IMPRESSION / ASSESSMENT AND PLAN / ED COURSE  Pertinent labs & imaging results that were available during my care of the patient were reviewed by me and considered in my medical decision making (see chart for details).  Review of the [place] CSRS was performed in accordance of the [facility] prior to dispensing any controlled drugs.   Patient's diagnosis is consistent with influenza. Vital signs and exam are reassuring.  We discussed ordering a flu test but patient was just exposed to somebody with influenza and has similar symptoms. Patient appears well and is staying well hydrated. Patient should alternate tylenol and ibuprofen for fever. Patient feels comfortable going home. Patient will be discharged home with prescriptions for Tamiflu, Zofran, inhaler, Tessalon Perles, ibuprofen. Patient is to follow up with PCP as needed or otherwise directed. Patient is given ED precautions to return to the ED for any  worsening or new symptoms.     ____________________________________________  FINAL CLINICAL IMPRESSION(S) / ED DIAGNOSES  Final diagnoses:  Influenza      NEW MEDICATIONS STARTED DURING THIS VISIT:  ED Discharge Orders        Ordered    oseltamivir (TAMIFLU) 75 MG capsule  2 times daily     12/06/17 2140    ondansetron (ZOFRAN) 4 MG tablet  Daily PRN     12/06/17 2140    albuterol (PROVENTIL HFA;VENTOLIN HFA) 108 (90 Base) MCG/ACT inhaler  Every 6 hours PRN     12/06/17 2140    benzonatate (TESSALON PERLES) 100 MG capsule  3 times daily PRN     12/06/17 2140    ibuprofen (ADVIL,MOTRIN) 800 MG tablet  Every 8 hours PRN     12/06/17 2140          This chart was dictated using voice recognition software/Dragon. Despite best efforts to proofread, errors can occur which can change the meaning. Any change was purely unintentional.    Enid Derry, PA-C 12/06/17 2329    Don Perking, [place], MD 12/07/17 1524

## 2018-01-27 ENCOUNTER — Other Ambulatory Visit: Payer: Self-pay

## 2018-01-27 ENCOUNTER — Encounter (HOSPITAL_COMMUNITY): Payer: Self-pay | Admitting: Family Medicine

## 2018-01-27 ENCOUNTER — Ambulatory Visit (HOSPITAL_COMMUNITY)
Admission: EM | Admit: 2018-01-27 | Discharge: 2018-01-27 | Disposition: A | Discharge status: Home / Self Care | Payer: Medicaid Other | Attending: Family Medicine | Admitting: Family Medicine

## 2018-01-27 DIAGNOSIS — K029 Dental caries, unspecified: Secondary | ICD-10-CM

## 2018-01-27 DIAGNOSIS — K0889 Other specified disorders of teeth and supporting structures: Secondary | ICD-10-CM | POA: Diagnosis not present

## 2018-01-27 DIAGNOSIS — K047 Periapical abscess without sinus: Secondary | ICD-10-CM

## 2018-01-27 MED ORDER — CHLORHEXIDINE GLUCONATE 0.12 % MT SOLN: 15.0000 mL | Freq: Two times a day (BID) | OROMUCOSAL | 0 refills | Status: DC

## 2018-01-27 MED ORDER — PENICILLIN V POTASSIUM 500 MG PO TABS: 500.0000 mg | ORAL_TABLET | Freq: Three times a day (TID) | ORAL | 0 refills | Status: DC

## 2018-01-27 MED ORDER — DICLOFENAC SODIUM 75 MG PO TBEC: 75.0000 mg | DELAYED_RELEASE_TABLET | Freq: Two times a day (BID) | ORAL | 0 refills | Status: DC

## 2018-01-27 NOTE — ED Triage Notes (Signed)
Pt c/o dental pain x 2 weeks.  

## 2018-01-27 NOTE — ED Provider Notes (Signed)
[facility]   [id number] 01/27/18 Arrival Time: 1410   SUBJECTIVE:  Amy Mcfarland is a 32 y.o. female who presents to the urgent care with complaint of toothache for two weeks.  She's been here before and wants pain medicine.  Says she has dental extraction planned for April 12.  Also c/o ear pain. Past Medical History:  Diagnosis Date  . Kidney infection   . Kidney infection   . Migraine   . Migraines   . Miscarriage    Family History  Problem Relation Age of Onset  . Diabetes Mother   . Hypertension Father   . Diabetes Father   . Asthma Maternal Uncle   . Cancer Maternal Grandmother        breast cancer  . Cancer Paternal Grandmother    Social History   Socioeconomic History  . Marital status: Single    Spouse name: Not on file  . Number of children: Not on file  . Years of education: Not on file  . Highest education level: Not on file  Social Needs  . Financial resource strain: Not on file  . Food insecurity - worry: Not on file  . Food insecurity - inability: Not on file  . Transportation needs - medical: Not on file  . Transportation needs - non-medical: Not on file  Occupational History  . Not on file  Tobacco Use  . Smoking status: Current Some Day Smoker    Packs/day: 0.25    Years: 7.00    Pack years: 1.75    Types: Cigarettes  . Smokeless tobacco: Never Used  Substance and Sexual Activity  . Alcohol use: Yes    Comment: occasionally  . Drug use: No    Comment: Quit Marijuana 01/2017  . Sexual activity: Not on file  Other Topics Concern  . Not on file  Social History Narrative  . Not on file   No outpatient medications have been marked as taking for the 01/27/18 encounter [facility] Encounter).   Allergies  Allergen Reactions  . Kiwi Extract Other (See Comments)    Makes mouth feel hairy  . Peach [Prunus Persica] Other (See Comments)    Makes mouth feel hairy  . Strawberry Extract Other (See Comments)    Makes mouth feel  like it has hair in it      ROS: As per HPI, remainder of ROS negative.   OBJECTIVE:   Vitals:   01/27/18 1503  BP: 140/78  Pulse: 82  Resp: 18  Temp: 98.4 F (36.9 C)  SpO2: 100%     General appearance: alert; no distress Eyes: PERRL; EOMI; conjunctiva normal HENT: normocephalic; atraumatic; TMs normal, canal normal, external ears normal without trauma; nasal mucosa normal; oral multiple dental caries. Neck: supple Back: no CVA tenderness Extremities: no cyanosis or edema; symmetrical with no gross deformities Skin: warm and dry Neurologic: normal gait; grossly normal Psychological: alert and cooperative; normal mood and affect      Labs:  Results for orders placed or performed during the hospital encounter of 08/15/17  POCT rapid strep A [facility])  Result Value Ref Range   Streptococcus, Group A Screen (Direct) NEGATIVE NEGATIVE    Labs Reviewed - No data to display  No results found.     ASSESSMENT & PLAN:  1. Dental caries   2. Toothache   3. Dental infection   4. Pain due to dental caries     Meds ordered this encounter  Medications  .  diclofenac (VOLTAREN) 75 MG EC tablet    Sig: Take 1 tablet (75 mg total) by mouth 2 (two) times daily.    Dispense:  14 tablet    Refill:  0  . penicillin v potassium (VEETID) 500 MG tablet    Sig: Take 1 tablet (500 mg total) by mouth 3 (three) times daily.    Dispense:  30 tablet    Refill:  0  . chlorhexidine (PERIDEX) 0.12 % solution    Sig: Use as directed 15 mLs in the mouth or throat 2 (two) times daily.    Dispense:  120 mL    Refill:  0    Reviewed expectations re: course of current medical issues. Questions answered. Outlined signs and symptoms indicating need for more acute intervention. Patient verbalized understanding. After Visit Summary given.    Procedures:      Elvina SidleLauenstein, Emmanuelle Coxe, MD 01/27/18 [phone]

## 2018-01-27 NOTE — Discharge Instructions (Signed)
I am prescribing the pain medicine recommended by the dentist.

## 2018-02-28 ENCOUNTER — Emergency Department
Admission: EM | Admit: 2018-02-28 | Discharge: 2018-02-28 | Disposition: A | Discharge status: Home / Self Care | Payer: Medicaid Other | Attending: Emergency Medicine | Admitting: Emergency Medicine

## 2018-02-28 ENCOUNTER — Encounter: Payer: Self-pay | Admitting: Emergency Medicine

## 2018-02-28 ENCOUNTER — Emergency Department: Payer: Medicaid Other

## 2018-02-28 ENCOUNTER — Other Ambulatory Visit: Payer: Self-pay

## 2018-02-28 DIAGNOSIS — S1093XA Contusion of unspecified part of neck, initial encounter: Secondary | ICD-10-CM

## 2018-02-28 DIAGNOSIS — W108XXA Fall (on) (from) other stairs and steps, initial encounter: Secondary | ICD-10-CM | POA: Insufficient documentation

## 2018-02-28 DIAGNOSIS — Y998 Other external cause status: Secondary | ICD-10-CM | POA: Insufficient documentation

## 2018-02-28 DIAGNOSIS — Y9389 Activity, other specified: Secondary | ICD-10-CM | POA: Insufficient documentation

## 2018-02-28 DIAGNOSIS — F17210 Nicotine dependence, cigarettes, uncomplicated: Secondary | ICD-10-CM | POA: Insufficient documentation

## 2018-02-28 DIAGNOSIS — Z79899 Other long term (current) drug therapy: Secondary | ICD-10-CM | POA: Diagnosis not present

## 2018-02-28 DIAGNOSIS — Y929 Unspecified place or not applicable: Secondary | ICD-10-CM | POA: Insufficient documentation

## 2018-02-28 DIAGNOSIS — S199XXA Unspecified injury of neck, initial encounter: Secondary | ICD-10-CM | POA: Diagnosis present

## 2018-02-28 MED ORDER — METHYLPREDNISOLONE 4 MG PO TBPK: ORAL_TABLET | ORAL | 0 refills | Status: DC

## 2018-02-28 NOTE — ED Triage Notes (Signed)
Pt states she tripped up the steps on Sunday, landed on the front of her neck, appears in NAD.

## 2018-02-28 NOTE — ED Notes (Signed)
Pt ambulatory to POV without difficulty. VSS. NAD. Discharge instructions, RX and follow up discussed. All questions addressed.  

## 2018-02-28 NOTE — ED Provider Notes (Signed)
[facility] Emergency Department Provider Note   ____________________________________________   First MD Initiated Contact with Patient 02/28/18 1334     (approximate)  I have reviewed the triage vital signs and the nursing notes.   HISTORY  Chief Complaint Neck Pain    HPI Amy Mcfarland is a 32 y.o. female patient complained of anterior neck pain secondary to tripping while going up the steps.  Patient is a step into the front of her neck.  Patient state pain increased with coughing and swallowing.  Patient is able to tolerate food and fluids.  Patient rates the pain as a 8/10.  Patient described the pain is "achy".  No palliative measure for complaint.  Past Medical History:  Diagnosis Date  . Kidney infection   . Kidney infection   . Migraine   . Migraines   . Miscarriage     Patient Active Problem List   Diagnosis Date Noted  . Contraceptive management 05/17/2016  . Encounter for Depo-Provera contraception 06/28/2013    Past Surgical History:  Procedure Laterality Date  . CESAREAN SECTION  2011  . DILATION AND CURETTAGE OF UTERUS  2006    Prior to Admission medications   Medication Sig Start Date End Date Taking? Authorizing Provider  albuterol (PROVENTIL HFA;VENTOLIN HFA) 108 (90 Base) MCG/ACT inhaler Inhale 2 puffs into the lungs every 6 (six) hours as needed for wheezing or shortness of breath. 12/06/17   Enid Derry, PA-C  chlorhexidine (PERIDEX) 0.12 % solution Use as directed 15 mLs in the mouth or throat 2 (two) times daily. 01/27/18   Elvina Sidle, MD  diclofenac (VOLTAREN) 75 MG EC tablet Take 1 tablet (75 mg total) by mouth 2 (two) times daily. 01/27/18   Elvina Sidle, MD  meloxicam (MOBIC) 7.5 MG tablet Take 1 tablet (7.5 mg total) by mouth daily. 11/11/17   Belinda Fisher, PA-C  methylPREDNISolone (MEDROL DOSEPAK) 4 MG TBPK tablet Take Tapered dose as directed 02/28/18   Joni Reining, PA-C  ondansetron [facility]) 4 MG  tablet Take 1 tablet (4 mg total) by mouth daily as needed for nausea or vomiting. 12/06/17 12/06/18  Enid Derry, PA-C  penicillin v potassium (VEETID) 500 MG tablet Take 1 tablet (500 mg total) by mouth 3 (three) times daily. 01/27/18   Elvina Sidle, MD  SUMAtriptan (IMITREX) 25 MG tablet Take 25 mg by mouth every 2 (two) hours as needed for migraine. May repeat in 2 hours if headache persists or recurs.    [provider]    Allergies Kiwi extract; Peach [prunus persica]; and Strawberry extract  Family History  Problem Relation Age of Onset  . Diabetes Mother   . Hypertension Father   . Diabetes Father   . Asthma Maternal Uncle   . Cancer Maternal Grandmother        breast cancer  . Cancer Paternal Grandmother     Social History Social History   Tobacco Use  . Smoking status: Current Some Day Smoker    Packs/day: 0.25    Years: 7.00    Pack years: 1.75    Types: Cigarettes  . Smokeless tobacco: Never Used  Substance Use Topics  . Alcohol use: Yes    Comment: occasionally  . Drug use: No    Comment: Quit Marijuana 01/2017    Review of Systems  Constitutional: No fever/chills Eyes: No visual changes. ENT: No sore throat. Cardiovascular: Denies chest pain. Respiratory: Denies shortness of breath. Gastrointestinal: No abdominal pain.  No nausea, no vomiting.  No diarrhea.  No constipation. Genitourinary: Negative for dysuria. Musculoskeletal: Negative for back pain. Skin: Negative for rash. Neurological: Negative for headaches, focal weakness or numbness. Allergic/Immunilogical: Food allergies. ____________________________________________   PHYSICAL EXAM:  VITAL SIGNS: ED Triage Vitals  Enc Vitals Group     BP 02/28/18 1259 125/74     Pulse Rate 02/28/18 1257 69     Resp 02/28/18 1257 18     Temp 02/28/18 1257 98.4 F (36.9 C)     Temp Source 02/28/18 1257 Oral     SpO2 02/28/18 1257 99 %     Weight 02/28/18 1254 230 lb (104.3 kg)     Height  02/28/18 1254 5\' 6"  (1.676 m)     Head Circumference --      Peak Flow --      Pain Score 02/28/18 1253 8     Pain Loc --      Pain Edu? --      Excl. in GC? --    Constitutional: Alert and oriented. Well appearing and in no acute distress. Eyes: Conjunctivae are normal. PERRL. EOMI. Head: Atraumatic. Nose: No congestion/rhinnorhea. Mouth/Throat: Mucous membranes are moist.  Oropharynx non-erythematous. Neck: No stridor.  No cervical spine tenderness to palpation. Hematological/Lymphatic/Immunilogical: No cervical lymphadenopathy. Cardiovascular: Normal rate, regular rhythm. Grossly normal heart sounds.  Good peripheral circulation. Respiratory: Normal respiratory effort.  No retractions. Lungs CTAB. Gastrointestinal: Soft and nontender. No distention. No abdominal bruits. No CVA tenderness. Musculoskeletal: No lower extremity tenderness nor edema.  No joint effusions. Neurologic:  Normal speech and language. No gross focal neurologic deficits are appreciated. No gait instability. Skin:  Skin is warm, dry and intact. No rash noted. Psychiatric: Mood and affect are normal. Speech and behavior are normal.  ____________________________________________   LABS (all labs ordered are listed, but only abnormal results are displayed)  Labs Reviewed - No data to display ____________________________________________  EKG   ____________________________________________  RADIOLOGY  No acute findings on soft tissue neck x-ray  Official radiology report(s): Dg Neck Soft Tissue  Result Date: 02/28/2018 CLINICAL DATA:  Fall with anterior neck pain EXAM: NECK SOFT TISSUES - 1+ VIEW COMPARISON:  03/17/2017 FINDINGS: There is no evidence of retropharyngeal soft tissue swelling or epiglottic enlargement. The cervical airway is unremarkable and no radio-opaque foreign body identified. IMPRESSION: Negative. Electronically Signed   By: Deatra RobinsonKevin  Herman M.D.   On: 02/28/2018 14:29     ____________________________________________   PROCEDURES  Procedure(s) performed: None  Procedures  Critical Care performed: No  ____________________________________________   INITIAL IMPRESSION / ASSESSMENT AND PLAN / ED COURSE  As part of my medical decision making, I reviewed the following data within the electronic MEDICAL RECORD NUMBER    Anterior neck pain secondary to contusion.  Discussed negative x-ray findings with patient.  Patient given discharge care instructions.  Patient advised to follow-up PCP for continued care.  Take medication as directed.      ____________________________________________   FINAL CLINICAL IMPRESSION(S) / ED DIAGNOSES  Final diagnoses:  Contusion of neck, initial encounter     ED Discharge Orders        Ordered    methylPREDNISolone (MEDROL DOSEPAK) 4 MG TBPK tablet     02/28/18 1443       Note:  This document was prepared using Dragon voice recognition software and may include unintentional dictation errors.    Joni ReiningSmith, Kynlei Piontek K, PA-C 02/28/18 1447    Darci CurrentBrown, [place] N, MD 02/28/18 [phone]

## 2018-04-27 ENCOUNTER — Emergency Department
Admission: EM | Admit: 2018-04-27 | Discharge: 2018-04-27 | Disposition: A | Discharge status: Home / Self Care | Payer: Medicaid Other | Attending: Emergency Medicine | Admitting: Emergency Medicine

## 2018-04-27 ENCOUNTER — Encounter: Payer: Self-pay | Admitting: Emergency Medicine

## 2018-04-27 DIAGNOSIS — Z79899 Other long term (current) drug therapy: Secondary | ICD-10-CM | POA: Insufficient documentation

## 2018-04-27 DIAGNOSIS — J028 Acute pharyngitis due to other specified organisms: Secondary | ICD-10-CM

## 2018-04-27 DIAGNOSIS — F17210 Nicotine dependence, cigarettes, uncomplicated: Secondary | ICD-10-CM | POA: Diagnosis not present

## 2018-04-27 DIAGNOSIS — J029 Acute pharyngitis, unspecified: Secondary | ICD-10-CM | POA: Diagnosis not present

## 2018-04-27 DIAGNOSIS — R05 Cough: Secondary | ICD-10-CM | POA: Diagnosis present

## 2018-04-27 DIAGNOSIS — J069 Acute upper respiratory infection, unspecified: Secondary | ICD-10-CM

## 2018-04-27 MED ORDER — DIPHENHYDRAMINE HCL 12.5 MG/5ML PO ELIX
12.5000 mg | ORAL_SOLUTION | Freq: Once | ORAL | Status: AC
  Administered 2018-04-27: 12.5 mg via ORAL
  Filled 2018-04-27: qty 5

## 2018-04-27 MED ORDER — FEXOFENADINE-PSEUDOEPHED ER 60-120 MG PO TB12: 1.0000 | ORAL_TABLET | Freq: Two times a day (BID) | ORAL | 0 refills | Status: DC

## 2018-04-27 MED ORDER — LIDOCAINE VISCOUS HCL 2 % MT SOLN
15.0000 mL | Freq: Once | OROMUCOSAL | Status: AC
  Administered 2018-04-27: 15 mL via OROMUCOSAL
  Filled 2018-04-27: qty 15

## 2018-04-27 MED ORDER — IBUPROFEN 800 MG PO TABS: 800.0000 mg | ORAL_TABLET | Freq: Three times a day (TID) | ORAL | 0 refills | Status: DC | PRN

## 2018-04-27 MED ORDER — BENZONATATE 100 MG PO CAPS: 200.0000 mg | ORAL_CAPSULE | Freq: Three times a day (TID) | ORAL | 0 refills | Status: DC | PRN

## 2018-04-27 MED ORDER — KETOROLAC TROMETHAMINE 60 MG/2ML IM SOLN
60.0000 mg | Freq: Once | INTRAMUSCULAR | Status: AC
  Administered 2018-04-27: 60 mg via INTRAMUSCULAR
  Filled 2018-04-27: qty 2

## 2018-04-27 MED ORDER — MAGIC MOUTHWASH W/LIDOCAINE: 5.0000 mL | Freq: Four times a day (QID) | ORAL | 0 refills | Status: DC

## 2018-04-27 MED ORDER — BENZONATATE 100 MG PO CAPS
200.0000 mg | ORAL_CAPSULE | Freq: Once | ORAL | Status: AC
  Administered 2018-04-27: 200 mg via ORAL
  Filled 2018-04-27: qty 2

## 2018-04-27 NOTE — ED Triage Notes (Signed)
Pt reports her niece came over Monday with flu like sx's and today she has it too. Pt c/o bodyaches, nasal congestion, cough and sore throat.

## 2018-04-27 NOTE — ED Provider Notes (Signed)
[facility] Emergency Department Provider Note   ____________________________________________   First MD Initiated Contact with Patient 04/27/18 1231     (approximate)  I have reviewed the triage vital signs and the nursing notes.   HISTORY  Chief Complaint Influenza; Sore Throat; and Nasal Congestion    HPI Amy Mcfarland is a 32 y.o. female patient complain of body aches, nasal congestion, sore throat, and nonproductive cough.  Onset of complaint at the knees visited her 3 days ago.  These had the same symptoms.  Patient denies fever/chills.  Patient denies nausea, vomiting, diarrhea.  Patient rates the pain discomfort as a 9/10.  Patient describes her pain is "achy".  No palliative measures for complaint.  Past Medical History:  Diagnosis Date  . Kidney infection   . Kidney infection   . Migraine   . Migraines   . Miscarriage     Patient Active Problem List   Diagnosis Date Noted  . Contraceptive management 05/17/2016  . Encounter for Depo-Provera contraception 06/28/2013    Past Surgical History:  Procedure Laterality Date  . CESAREAN SECTION  2011  . DILATION AND CURETTAGE OF UTERUS  2006    Prior to Admission medications   Medication Sig Start Date End Date Taking? Authorizing Provider  albuterol (PROVENTIL HFA;VENTOLIN HFA) 108 (90 Base) MCG/ACT inhaler Inhale 2 puffs into the lungs every 6 (six) hours as needed for wheezing or shortness of breath. 12/06/17   Enid Derry, PA-C  benzonatate (TESSALON PERLES) 100 MG capsule Take 2 capsules (200 mg total) by mouth 3 (three) times daily as needed. 04/27/18 04/27/19  Joni Reining, PA-C  chlorhexidine (PERIDEX) 0.12 % solution Use as directed 15 mLs in the mouth or throat 2 (two) times daily. 01/27/18   Elvina Sidle, MD  diclofenac (VOLTAREN) 75 MG EC tablet Take 1 tablet (75 mg total) by mouth 2 (two) times daily. 01/27/18   Elvina Sidle, MD  fexofenadine-pseudoephedrine  (ALLEGRA-D) 60-120 MG 12 hr tablet Take 1 tablet by mouth 2 (two) times daily. 04/27/18   Joni Reining, PA-C  ibuprofen (ADVIL,MOTRIN) 800 MG tablet Take 1 tablet (800 mg total) by mouth every 8 (eight) hours as needed for moderate pain. 04/27/18   Joni Reining, PA-C  magic mouthwash w/lidocaine SOLN Take 5 mLs by mouth 4 (four) times daily. 04/27/18   Joni Reining, PA-C  meloxicam (MOBIC) 7.5 MG tablet Take 1 tablet (7.5 mg total) by mouth daily. 11/11/17   Belinda Fisher, PA-C  methylPREDNISolone (MEDROL DOSEPAK) 4 MG TBPK tablet Take Tapered dose as directed 02/28/18   Joni Reining, PA-C  ondansetron [facility]) 4 MG tablet Take 1 tablet (4 mg total) by mouth daily as needed for nausea or vomiting. 12/06/17 12/06/18  Enid Derry, PA-C  penicillin v potassium (VEETID) 500 MG tablet Take 1 tablet (500 mg total) by mouth 3 (three) times daily. 01/27/18   Elvina Sidle, MD  SUMAtriptan (IMITREX) 25 MG tablet Take 25 mg by mouth every 2 (two) hours as needed for migraine. May repeat in 2 hours if headache persists or recurs.    [provider]    Allergies Kiwi extract; Peach [prunus persica]; and Strawberry extract  Family History  Problem Relation Age of Onset  . Diabetes Mother   . Hypertension Father   . Diabetes Father   . Asthma Maternal Uncle   . Cancer Maternal Grandmother        breast cancer  . Cancer Paternal  Grandmother     Social History Social History   Tobacco Use  . Smoking status: Current Some Day Smoker    Packs/day: 0.25    Years: 7.00    Pack years: 1.75    Types: Cigarettes  . Smokeless tobacco: Never Used  Substance Use Topics  . Alcohol use: Yes    Comment: occasionally  . Drug use: No    Comment: Quit Marijuana 01/2017    Review of Systems Constitutional: No fever/chills.  Body ache. Eyes: No visual changes. ENT: Sore throat and nasal congestion. Cardiovascular: Denies chest pain. Respiratory: Denies shortness of breath.  Nonproductive  cough. Skin: Negative for rash. Neurological: Negative for headaches, focal weakness or numbness. Allergic/Immunilogical: Fruits.  ____________________________________________   PHYSICAL EXAM:  VITAL SIGNS: ED Triage Vitals  Enc Vitals Group     BP 04/27/18 1201 117/84     Pulse Rate 04/27/18 1201 91     Resp 04/27/18 1201 16     Temp 04/27/18 1201 98.8 F (37.1 C)     Temp Source 04/27/18 1201 Oral     SpO2 04/27/18 1201 100 %     Weight 04/27/18 1200 240 lb (108.9 kg)     Height 04/27/18 1200 5\' 6"  (1.676 m)     Head Circumference --      Peak Flow --      Pain Score 04/27/18 1221 9     Pain Loc --      Pain Edu? --      Excl. in GC? --    Constitutional: Alert and oriented. Well appearing and in no acute distress. Nose: Edematous nasal turbinates clear rhinorrhea.. Mouth/Throat: Mucous membranes are moist.  Oropharynx non-erythematous.  Postnasal drainage. Neck: No stridor. Hematological/Lymphatic/Immunilogical: No cervical lymphadenopathy. Cardiovascular: Normal rate, regular rhythm. Grossly normal heart sounds.  Good peripheral circulation. Respiratory: Normal respiratory effort.  No retractions. Lungs CTAB. Neurologic:  Normal speech and language. No gross focal neurologic deficits are appreciated. No gait instability. Skin:  Skin is warm, dry and intact. No rash noted. Psychiatric: Mood and affect are normal. Speech and behavior are normal.  ____________________________________________   LABS (all labs ordered are listed, but only abnormal results are displayed)  Labs Reviewed - No data to display ____________________________________________  EKG   ____________________________________________  RADIOLOGY  ED MD interpretation:    Official radiology report(s): No results found.  ____________________________________________   PROCEDURES  Procedure(s) performed: None  Procedures  Critical Care performed:  No  ____________________________________________   INITIAL IMPRESSION / ASSESSMENT AND PLAN / ED COURSE  As part of my medical decision making, I reviewed the following data within the electronic MEDICAL RECORD NUMBER    Viral respiratory infection with pharyngitis.  Patient given discharge care instruction a work note.  Patient advised take medication as directed.  Patient advised follow-up PCP if no improvement 3 to 5 days.      ____________________________________________   FINAL CLINICAL IMPRESSION(S) / ED DIAGNOSES  Final diagnoses:  Upper respiratory tract infection, unspecified type  Pharyngitis due to other organism     ED Discharge Orders        Ordered    fexofenadine-pseudoephedrine (ALLEGRA-D) 60-120 MG 12 hr tablet  2 times daily     04/27/18 1248    benzonatate (TESSALON PERLES) 100 MG capsule  3 times daily PRN     04/27/18 1248    magic mouthwash w/lidocaine SOLN  4 times daily     04/27/18 1248    ibuprofen (ADVIL,MOTRIN) 800  MG tablet  Every 8 hours PRN     04/27/18 1248       Note:  This document was prepared using Dragon voice recognition software and may include unintentional dictation errors.    Joni Reining, PA-C 04/27/18 1255    Merrily Brittle, MD 04/28/18 1209

## 2018-04-27 NOTE — ED Notes (Signed)
Pt c/o cough, headache, chills x 2 days. No relief with motrin or theraflu

## 2018-07-06 DIAGNOSIS — M0890 Juvenile arthritis, unspecified, unspecified site: Secondary | ICD-10-CM | POA: Insufficient documentation

## 2018-07-06 LAB — HM PAP SMEAR: HM Pap smear: NEGATIVE

## 2018-07-08 ENCOUNTER — Emergency Department
Admission: EM | Admit: 2018-07-08 | Discharge: 2018-07-08 | Disposition: A | Discharge status: Home / Self Care | Payer: Medicaid Other | Attending: Emergency Medicine | Admitting: Emergency Medicine

## 2018-07-08 ENCOUNTER — Emergency Department: Payer: Medicaid Other

## 2018-07-08 ENCOUNTER — Other Ambulatory Visit: Payer: Self-pay

## 2018-07-08 DIAGNOSIS — Z79899 Other long term (current) drug therapy: Secondary | ICD-10-CM | POA: Insufficient documentation

## 2018-07-08 DIAGNOSIS — M79605 Pain in left leg: Secondary | ICD-10-CM | POA: Insufficient documentation

## 2018-07-08 DIAGNOSIS — M25552 Pain in left hip: Secondary | ICD-10-CM | POA: Diagnosis present

## 2018-07-08 DIAGNOSIS — F17210 Nicotine dependence, cigarettes, uncomplicated: Secondary | ICD-10-CM | POA: Insufficient documentation

## 2018-07-08 MED ORDER — PREDNISONE 10 MG PO TABS: 10.0000 mg | ORAL_TABLET | Freq: Every day | ORAL | 0 refills | Status: DC

## 2018-07-08 MED ORDER — OXYCODONE-ACETAMINOPHEN 5-325 MG PO TABS
1.0000 | ORAL_TABLET | Freq: Once | ORAL | Status: AC
  Administered 2018-07-08: 1 via ORAL
  Filled 2018-07-08: qty 1

## 2018-07-08 MED ORDER — OXYCODONE HCL 5 MG PO TABS: 5.0000 mg | ORAL_TABLET | Freq: Four times a day (QID) | ORAL | 0 refills | Status: DC | PRN

## 2018-07-08 MED ORDER — METHOCARBAMOL 500 MG PO TABS: 500.0000 mg | ORAL_TABLET | Freq: Four times a day (QID) | ORAL | 0 refills | Status: DC

## 2018-07-08 MED ORDER — METHOCARBAMOL 500 MG PO TABS
500.0000 mg | ORAL_TABLET | Freq: Once | ORAL | Status: AC
  Administered 2018-07-08: 500 mg via ORAL
  Filled 2018-07-08: qty 1

## 2018-07-08 NOTE — ED Notes (Signed)
First Nurse Note: Pt c/o leg pain. Pt is in NAD at this time.

## 2018-07-08 NOTE — ED Provider Notes (Signed)
[facility] EMERGENCY DEPARTMENT Provider Note   CSN: [id number] Arrival date & time: 07/08/18  1231     History   Chief Complaint Chief Complaint  Patient presents with  . Leg Pain    HPI Amy Mcfarland is a 32 y.o. female presents to the emergency department for evaluation of acute left hip pain.  Patient states she developed left hip pain 1 day ago with no preceding trauma or injury.  She points to her left groin.  She describes the pain as tightness with occasional tingling.  Symptoms are increased with lying down but she also has pain with standing.  She denies any numbness or weakness in the lower extremities.  She does admit to having spasms in the left anterior thigh along the groin.  She denies any recent increase in activities such as exercising or weight lifting prior to developing pain.  She has not had any pain medicines.  She denies any back pain, loss of bowel or bladder symptoms.  She denies any recent viral illnesses.  HPI  Past Medical History:  Diagnosis Date  . Kidney infection   . Kidney infection   . Migraine   . Migraines   . Miscarriage     Patient Active Problem List   Diagnosis Date Noted  . Contraceptive management 05/17/2016  . Encounter for Depo-Provera contraception 06/28/2013    Past Surgical History:  Procedure Laterality Date  . CESAREAN SECTION  2011  . DILATION AND CURETTAGE OF UTERUS  2006     OB History    Gravida  2   Para  1   Term  1   Preterm      AB  1   Living  1     SAB  1   TAB      Ectopic      Multiple      Live Births  1        Obstetric Comments  LMP 12/25/14         Home Medications    Prior to Admission medications   Medication Sig Start Date End Date Taking? Authorizing Provider  albuterol (PROVENTIL HFA;VENTOLIN HFA) 108 (90 Base) MCG/ACT inhaler Inhale 2 puffs into the lungs every 6 (six) hours as needed for wheezing or shortness of breath. 12/06/17   Enid Derry, PA-C  benzonatate (TESSALON PERLES) 100 MG capsule Take 2 capsules (200 mg total) by mouth 3 (three) times daily as needed. 04/27/18 04/27/19  Joni Reining, PA-C  chlorhexidine (PERIDEX) 0.12 % solution Use as directed 15 mLs in the mouth or throat 2 (two) times daily. 01/27/18   Elvina Sidle, MD  diclofenac (VOLTAREN) 75 MG EC tablet Take 1 tablet (75 mg total) by mouth 2 (two) times daily. 01/27/18   Elvina Sidle, MD  fexofenadine-pseudoephedrine (ALLEGRA-D) 60-120 MG 12 hr tablet Take 1 tablet by mouth 2 (two) times daily. 04/27/18   Joni Reining, PA-C  ibuprofen (ADVIL,MOTRIN) 800 MG tablet Take 1 tablet (800 mg total) by mouth every 8 (eight) hours as needed for moderate pain. 04/27/18   Joni Reining, PA-C  magic mouthwash w/lidocaine SOLN Take 5 mLs by mouth 4 (four) times daily. 04/27/18   Joni Reining, PA-C  meloxicam (MOBIC) 7.5 MG tablet Take 1 tablet (7.5 mg total) by mouth daily. 11/11/17   Cathie Hoops, Amy V, PA-C  methocarbamol (ROBAXIN) 500 MG tablet Take 1 tablet (500 mg total) by mouth 4 (four) times daily. 07/08/18  Evon SlackGaines, Raynell Upton C, PA-C  methylPREDNISolone (MEDROL DOSEPAK) 4 MG TBPK tablet Take Tapered dose as directed 02/28/18   Joni ReiningSmith, Ronald K, PA-C  ondansetron [facility](ZOFRAN) 4 MG tablet Take 1 tablet (4 mg total) by mouth daily as needed for nausea or vomiting. 12/06/17 12/06/18  Enid DerryWagner, Ashley, PA-C  oxyCODONE (ROXICODONE) 5 MG immediate release tablet Take 1 tablet (5 mg total) by mouth every 6 (six) hours as needed. 07/08/18 07/08/19  Evon SlackGaines, Deicy Rusk C, PA-C  penicillin v potassium (VEETID) 500 MG tablet Take 1 tablet (500 mg total) by mouth 3 (three) times daily. 01/27/18   Elvina SidleLauenstein, Kurt, MD  predniSONE (DELTASONE) 10 MG tablet Take 1 tablet (10 mg total) by mouth daily. 6,5,4,3,2,1 six day taper 07/08/18   Evon SlackGaines, Babe Anthis C, PA-C  SUMAtriptan (IMITREX) 25 MG tablet Take 25 mg by mouth every 2 (two) hours as needed for migraine. May repeat in 2 hours if headache persists or  recurs.    [provider]    Family History Family History  Problem Relation Age of Onset  . Diabetes Mother   . Hypertension Father   . Diabetes Father   . Asthma Maternal Uncle   . Cancer Maternal Grandmother        breast cancer  . Cancer Paternal Grandmother     Social History Social History   Tobacco Use  . Smoking status: Current Some Day Smoker    Packs/day: 0.25    Years: 7.00    Pack years: 1.75    Types: Cigarettes  . Smokeless tobacco: Never Used  Substance Use Topics  . Alcohol use: Yes    Comment: occasionally  . Drug use: No    Comment: Quit Marijuana 01/2017     Allergies   Kiwi extract; Peach [prunus persica]; and Strawberry extract   Review of Systems Review of Systems  Constitutional: Negative for fever.  Respiratory: Negative for shortness of breath.   Cardiovascular: Negative for chest pain.  Gastrointestinal: Negative for abdominal pain.  Genitourinary: Negative for difficulty urinating, dysuria and urgency.  Musculoskeletal: Positive for arthralgias and gait problem. Negative for back pain, joint swelling and myalgias.  Skin: Negative for rash.  Neurological: Negative for dizziness and headaches.     Physical Exam Updated Vital Signs BP 135/85 (BP Location: Left Arm)   Pulse 98   Temp 97.9 F (36.6 C) (Oral)   Resp 20   Ht 5\' 6"  (1.676 m)   Wt 111.1 kg   SpO2 97%   BMI 39.54 kg/m   Physical Exam  Constitutional: She is oriented to person, place, and time. She appears well-developed and well-nourished.  HENT:  Head: Normocephalic and atraumatic.  Eyes: Conjunctivae are normal.  Neck: Normal range of motion.  Cardiovascular: Normal rate.  Pulmonary/Chest: Effort normal. No respiratory distress.  Musculoskeletal: Normal range of motion.  Patient ambulatory with mild antalgic gait.  She is no tenderness along the lumbar spinous process or left and right paravertebral muscles.  She has mild pain with left hip internal  rotation.  She is tender along the left groin with no warmth or redness noted.  No swelling or edema noted throughout the left lower extremity.  She is neurovascular intact in left lower extremity.  Neurological: She is alert and oriented to person, place, and time.  Skin: Skin is warm. No rash noted.  Psychiatric: She has a normal mood and affect. Her behavior is normal. Thought content normal.     ED Treatments / Results  Labs (all  labs ordered are listed, but only abnormal results are displayed) Labs Reviewed - No data to display  EKG None  Radiology Dg Hip Unilat W Or Wo Pelvis 2-3 Views Left  Result Date: 07/08/2018 CLINICAL DATA:  Left hip pain for 2 days.  No reported injury. EXAM: DG HIP (WITH OR WITHOUT PELVIS) 2-3V LEFT COMPARISON:  None. FINDINGS: There is no evidence of hip fracture or dislocation. There is no evidence of arthropathy or other focal bone abnormality. IMPRESSION: Negative. Electronically Signed   By: Delbert Phenix M.D.   On: 07/08/2018 13:37    Procedures Procedures (including critical care time)  Medications Ordered in ED Medications  oxyCODONE-acetaminophen (PERCOCET/ROXICET) 5-325 MG per tablet 1 tablet (1 tablet Oral Given 07/08/18 1312)  methocarbamol (ROBAXIN) tablet 500 mg (500 mg Oral Given 07/08/18 1312)     Initial Impression / Assessment and Plan / ED Course  I have reviewed the triage vital signs and the nursing notes.  Pertinent labs & imaging results that were available during my care of the patient were reviewed by me and considered in my medical decision making (see chart for details).     32 year old female with left groin pain she describes tingling with spasms.  No trauma or injury.  Mild pain with hip internal rotation.  X-rays obtained to rule out any type of fracture, AVN.  X-rays reviewed by me and read by radiologist are negative.  Patient with no signs of any arthropathy.  Patient with no signs of any infection on exam, vital  signs are normal.  Patient's exam and history consistent with muscle spasm and/or possible left lumbar radiculopathy.  Patient started on a prednisone taper, given prescription for oxycodone and muscle relaxer.  She is educated signs symptoms return to ED for.  She will follow-up with orthopedics if no improvement 1 week.  Patient was given a note to return to work with light duty restrictions.  Final Clinical Impressions(s) / ED Diagnoses   Final diagnoses:  Left leg pain  Left hip pain    ED Discharge Orders         Ordered    methocarbamol (ROBAXIN) 500 MG tablet  4 times daily     07/08/18 1359    oxyCODONE (ROXICODONE) 5 MG immediate release tablet  Every 6 hours PRN     07/08/18 1359    predniSONE (DELTASONE) 10 MG tablet  Daily     07/08/18 1359           Ronnette Juniper 07/08/18 1406    Jeanmarie Plant, MD 07/08/18 1527
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
## 2018-07-08 NOTE — Discharge Instructions (Signed)
Please take medications as prescribed for pain.  Please follow-up with orthopedics if no improvement of left hip pain in 1 week.  If any increasing pain, fevers, swelling in the lower extremity return to the emergency department immediately.
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
## 2019-01-27 ENCOUNTER — Encounter: Payer: Self-pay | Admitting: Emergency Medicine

## 2019-01-27 ENCOUNTER — Emergency Department
Admission: EM | Admit: 2019-01-27 | Discharge: 2019-01-27 | Disposition: A | Discharge status: Home / Self Care | Payer: Medicaid Other | Attending: Emergency Medicine | Admitting: Emergency Medicine

## 2019-01-27 ENCOUNTER — Other Ambulatory Visit: Payer: Self-pay

## 2019-01-27 DIAGNOSIS — K047 Periapical abscess without sinus: Secondary | ICD-10-CM | POA: Insufficient documentation

## 2019-01-27 DIAGNOSIS — F17210 Nicotine dependence, cigarettes, uncomplicated: Secondary | ICD-10-CM | POA: Insufficient documentation

## 2019-01-27 DIAGNOSIS — Z79899 Other long term (current) drug therapy: Secondary | ICD-10-CM | POA: Insufficient documentation

## 2019-01-27 MED ORDER — HYDROCODONE-ACETAMINOPHEN 5-325 MG PO TABS: 1.0000 | ORAL_TABLET | Freq: Four times a day (QID) | ORAL | 0 refills | Status: DC | PRN

## 2019-01-27 MED ORDER — AMOXICILLIN-POT CLAVULANATE 875-125 MG PO TABS: 1.0000 | ORAL_TABLET | Freq: Two times a day (BID) | ORAL | 0 refills | Status: DC

## 2019-01-27 MED ORDER — MAGIC MOUTHWASH W/LIDOCAINE: 10.0000 mL | Freq: Four times a day (QID) | ORAL | 0 refills | Status: DC | PRN

## 2019-01-27 NOTE — ED Triage Notes (Signed)
Pt to ED from home c/o left sided tooth ache for about 1 week, able to talk without issue, maintaining saliva.  Left side face swollen.

## 2019-01-27 NOTE — ED Provider Notes (Signed)
[facility] Emergency Department Provider Note ____________________________________________  Time seen: Approximately 11:10 AM  I have reviewed the triage vital signs and the nursing notes.   HISTORY  Chief Complaint Dental Pain   HPI Amy Mcfarland is a 33 y.o. female who presents to the emergency department for treatment and evaluation of lef side tooth ache x 1 week. Facial swelling started yesterday. Pain worse last night. No relief with tylenol or Mcfarland.  Past Medical History:  Diagnosis Date  . Kidney infection   . Kidney infection   . Migraine   . Migraines   . Miscarriage     Patient Active Problem List   Diagnosis Date Noted  . Contraceptive management 05/17/2016  . Encounter for Depo-Provera contraception 06/28/2013    Past Surgical History:  Procedure Laterality Date  . CESAREAN SECTION  2011  . DILATION AND CURETTAGE OF UTERUS  2006    Prior to Admission medications   Medication Sig Start Date End Date Taking? Authorizing Provider  albuterol (PROVENTIL HFA;VENTOLIN HFA) 108 (90 Base) MCG/ACT inhaler Inhale 2 puffs into the lungs every 6 (six) hours as needed for wheezing or shortness of breath. 12/06/17   Enid Derry, PA-C  amoxicillin-clavulanate (AUGMENTIN) 875-125 MG tablet Take 1 tablet by mouth 2 (two) times daily. 01/27/19   Salayah Meares B, FNP  benzonatate (TESSALON PERLES) 100 MG capsule Take 2 capsules (200 mg total) by mouth 3 (three) times daily as needed. 04/27/18 04/27/19  Joni Reining, PA-C  chlorhexidine (PERIDEX) 0.12 % solution Use as directed 15 mLs in the mouth or throat 2 (two) times daily. 01/27/18   Elvina Sidle, MD  diclofenac (VOLTAREN) 75 MG EC tablet Take 1 tablet (75 mg total) by mouth 2 (two) times daily. 01/27/18   Elvina Sidle, MD  fexofenadine-pseudoephedrine (ALLEGRA-D) 60-120 MG 12 hr tablet Take 1 tablet by mouth 2 (two) times daily. 04/27/18   Joni Reining, PA-C   HYDROcodone-acetaminophen (NORCO/VICODIN) 5-325 MG tablet Take 1 tablet by mouth every 6 (six) hours as needed for moderate pain. 01/27/19 01/27/20  Ginnie Marich, Kasandra Knudsen, FNP  Mcfarland (ADVIL,MOTRIN) 800 MG tablet Take 1 tablet (800 mg total) by mouth every 8 (eight) hours as needed for moderate pain. 04/27/18   Joni Reining, PA-C  magic mouthwash w/lidocaine SOLN Take 10 mLs by mouth 4 (four) times daily as needed for mouth pain. 01/27/19   Colie Fugitt, Rulon Eisenmenger B, FNP  meloxicam (MOBIC) 7.5 MG tablet Take 1 tablet (7.5 mg total) by mouth daily. 11/11/17   Cathie Hoops, Amy V, PA-C  methocarbamol (ROBAXIN) 500 MG tablet Take 1 tablet (500 mg total) by mouth 4 (four) times daily. 07/08/18   Evon Slack, PA-C  methylPREDNISolone (MEDROL DOSEPAK) 4 MG TBPK tablet Take Tapered dose as directed 02/28/18   Joni Reining, PA-C  oxyCODONE (ROXICODONE) 5 MG immediate release tablet Take 1 tablet (5 mg total) by mouth every 6 (six) hours as needed. 07/08/18 07/08/19  Evon Slack, PA-C  penicillin v potassium (VEETID) 500 MG tablet Take 1 tablet (500 mg total) by mouth 3 (three) times daily. 01/27/18   Elvina Sidle, MD  predniSONE (DELTASONE) 10 MG tablet Take 1 tablet (10 mg total) by mouth daily. 6,5,4,3,2,1 six day taper 07/08/18   Evon Slack, PA-C  SUMAtriptan (IMITREX) 25 MG tablet Take 25 mg by mouth every 2 (two) hours as needed for migraine. May repeat in 2 hours if headache persists or recurs.    [provider]  Allergies Kiwi extract; Peach [prunus persica]; and Strawberry extract  Family History  Problem Relation Age of Onset  . Diabetes Mother   . Hypertension Father   . Diabetes Father   . Asthma Maternal Uncle   . Cancer Maternal Grandmother        breast cancer  . Cancer Paternal Grandmother     Social History Social History   Tobacco Use  . Smoking status: Current Some Day Smoker    Packs/day: 0.25    Years: 7.00    Pack years: 1.75    Types: Cigarettes  . Smokeless  tobacco: Never Used  Substance Use Topics  . Alcohol use: Yes    Comment: occasionally  . Drug use: No    Comment: Quit Marijuana 01/2017    Review of Systems Constitutional: Negative for fever or recent illness. ENT: Positive for dental pain. Musculoskeletal: Negative for trismus of the jaw.  Skin: Negative for wound or lesion. ____________________________________________   PHYSICAL EXAM:  VITAL SIGNS: ED Triage Vitals [01/27/19 0919]  Enc Vitals Group     BP (!) 145/91     Pulse Rate 86     Resp 16     Temp 98.3 F (36.8 C)     Temp Source Oral     SpO2 100 %     Weight 248 lb (112.5 kg)     Height 5\' 6"  (1.676 m)     Head Circumference      Peak Flow      Pain Score 10     Pain Loc      Pain Edu?      Excl. in GC?     Constitutional: Alert and oriented. Well appearing and in no acute distress. Eyes: Conjunctiva are clear without discharge or drainage. Mouth/Throat: Airway is patent. Periodontal Exam    Hematological/Lymphatic/Immunilogical: No palpable anterior cervical adenopathy. Respiratory: Respirations even and unlabored. Musculoskeletal: Decreased ROM of the jaw secondary to pain. Neurologic: Awake, alert, oriented.  Skin:  Mild left side facial swelling. Psychiatric: Affect and behavior intact.  ____________________________________________   LABS (all labs ordered are listed, but only abnormal results are displayed)  Labs Reviewed - No data to display ____________________________________________   RADIOLOGY  Not indicated. ____________________________________________   PROCEDURES  Procedure(s) performed:   Procedures  Critical Care performed: No ____________________________________________   INITIAL IMPRESSION / ASSESSMENT AND PLAN / ED COURSE  Amy Mcfarland is a 33 y.o. female who presents to the emergency department for treatment and evaluation of dental pain. She has a dentist appointment scheduled on April 9. She will be  treated with Augmentin and Norco. She is to return to the ER if symptoms change or worsen and she is unable to see her PCP or dentist before her scheduled appointment.  Pertinent labs & imaging results that were available during my care of the patient were reviewed by me and considered in my medical decision making (see chart for details).  ____________________________________________   FINAL CLINICAL IMPRESSION(S) / ED DIAGNOSES  Final diagnoses:  Dental infection    New Prescriptions   AMOXICILLIN-CLAVULANATE (AUGMENTIN) 875-125 MG TABLET    Take 1 tablet by mouth 2 (two) times daily.   HYDROCODONE-ACETAMINOPHEN (NORCO/VICODIN) 5-325 MG TABLET    Take 1 tablet by mouth every 6 (six) hours as needed for moderate pain.   MAGIC MOUTHWASH W/LIDOCAINE SOLN    Take 10 mLs by mouth 4 (four) times daily as needed for mouth pain.    If controlled substance  prescribed during this visit, 12 month history viewed on the [facility] prior to issuing an initial prescription for Schedule II or III opiod.  Note:  This document was prepared using Dragon voice recognition software and may include unintentional dictation errors.   Chinita Pester, FNP 01/27/19 1132    Jene Every, MD 01/27/19 [phone]

## 2019-04-02 ENCOUNTER — Emergency Department
Admission: EM | Admit: 2019-04-02 | Discharge: 2019-04-02 | Disposition: A | Discharge status: Home / Self Care | Payer: Medicaid Other | Attending: Emergency Medicine | Admitting: Emergency Medicine

## 2019-04-02 ENCOUNTER — Other Ambulatory Visit: Payer: Self-pay

## 2019-04-02 DIAGNOSIS — N644 Mastodynia: Secondary | ICD-10-CM | POA: Diagnosis present

## 2019-04-02 DIAGNOSIS — F17210 Nicotine dependence, cigarettes, uncomplicated: Secondary | ICD-10-CM | POA: Diagnosis not present

## 2019-04-02 DIAGNOSIS — L309 Dermatitis, unspecified: Secondary | ICD-10-CM | POA: Diagnosis not present

## 2019-04-02 MED ORDER — BETAMETHASONE DIPROPIONATE 0.05 % EX CREA: TOPICAL_CREAM | Freq: Two times a day (BID) | CUTANEOUS | 0 refills | Status: DC

## 2019-04-02 NOTE — ED Notes (Signed)
See triage note  Presents with an irritation around nipple of right breast  Area has been there for about 5 months

## 2019-04-02 NOTE — Discharge Instructions (Addendum)
Apply the medication as prescribed.  Follow-up with your regular doctor or Dr. Dalbert Garnet for evaluation of the breast tenderness and rash.  They would be able to provide a referral for a mammogram.

## 2019-04-02 NOTE — ED Triage Notes (Signed)
Pt c/o irritated around the nipple of her left breast for the past 6 months.

## 2019-04-02 NOTE — ED Provider Notes (Signed)
[facility] Emergency Department Provider Note  ____________________________________________   First MD Initiated Contact with Patient 04/02/19 1335     (approximate)  I have reviewed the triage vital signs and the nursing notes.   HISTORY  Chief Complaint Breast Problem    HPI David StallJasmine L Nazzaro is a 33 y.o. female presents emergency department with complaints of irritation around the nipple of the left breast.  States it is been there for about 5 months.  She states she started having pain into the left axilla.  No drainage from the nipple and no changes in the skin other than the 2 places that have been there for 6 months.  Family history is positive for breast CA both were in their late 80s at diagnosis.    Past Medical History:  Diagnosis Date  . Kidney infection   . Kidney infection   . Migraine   . Migraines   . Miscarriage     Patient Active Problem List   Diagnosis Date Noted  . Contraceptive management 05/17/2016  . Encounter for Depo-Provera contraception 06/28/2013    Past Surgical History:  Procedure Laterality Date  . CESAREAN SECTION  2011  . DILATION AND CURETTAGE OF UTERUS  2006    Prior to Admission medications   Medication Sig Start Date End Date Taking? Authorizing Provider  albuterol (PROVENTIL HFA;VENTOLIN HFA) 108 (90 Base) MCG/ACT inhaler Inhale 2 puffs into the lungs every 6 (six) hours as needed for wheezing or shortness of breath. 12/06/17   Enid DerryWagner, Ashley, PA-C  betamethasone dipropionate (DIPROLENE) 0.05 % cream Apply topically 2 (two) times daily. 04/02/19   Dearra Myhand, Roselyn BeringSusan W, PA-C  SUMAtriptan (IMITREX) 25 MG tablet Take 25 mg by mouth every 2 (two) hours as needed for migraine. May repeat in 2 hours if headache persists or recurs.    [provider]    Allergies Kiwi extract; Peach [prunus persica]; and Strawberry extract  Family History  Problem Relation Age of Onset  . Diabetes Mother   .  Hypertension Father   . Diabetes Father   . Asthma Maternal Uncle   . Cancer Maternal Grandmother        breast cancer  . Cancer Paternal Grandmother     Social History Social History   Tobacco Use  . Smoking status: Current Some Day Smoker    Packs/day: 0.25    Years: 7.00    Pack years: 1.75    Types: Cigarettes  . Smokeless tobacco: Never Used  Substance Use Topics  . Alcohol use: Yes    Comment: occasionally  . Drug use: No    Comment: Quit Marijuana 01/2017    Review of Systems  Constitutional: No fever/chills Eyes: No visual changes. ENT: No sore throat. Respiratory: Denies cough Genitourinary: Negative for dysuria. Musculoskeletal: Negative for back pain. Skin: Negative for rash.  Breast rash and itching    ____________________________________________   PHYSICAL EXAM:  VITAL SIGNS: ED Triage Vitals  Enc Vitals Group     BP 04/02/19 1318 127/75     Pulse Rate 04/02/19 1318 76     Resp 04/02/19 1318 16     Temp 04/02/19 1318 98.1 F (36.7 C)     Temp Source 04/02/19 1318 Oral     SpO2 04/02/19 1318 98 %     Weight 04/02/19 1319 272 lb (123.4 kg)     Height 04/02/19 1319 5\' 6"  (1.676 m)     Head Circumference --  Peak Flow --      Pain Score 04/02/19 1319 2     Pain Loc --      Pain Edu? --      Excl. in GC? --     Constitutional: Alert and oriented. Well appearing and in no acute distress. Eyes: Conjunctivae are normal.  Head: Atraumatic. Nose: No congestion/rhinnorhea. Mouth/Throat: Mucous membranes are moist.   Neck:  supple no lymphadenopathy noted Cardiovascular: Normal rate, regular rhythm.  Respiratory: Normal respiratory effort.  No retractions, Breast: The left breast does not have any dimpling or signs of Paget's disease.  The area Ola has 2 scaly areas that are eczema, no nipple drainage noted  GU: deferred Musculoskeletal: FROM all extremities, warm and well perfused Neurologic:  Normal speech and language.  Skin:  Skin is  warm, dry and intact. No rash noted. Psychiatric: Mood and affect are normal. Speech and behavior are normal.  ____________________________________________   LABS (all labs ordered are listed, but only abnormal results are displayed)  Labs Reviewed - No data to display ____________________________________________   ____________________________________________  RADIOLOGY    ____________________________________________   PROCEDURES  Procedure(s) performed: No  Procedures    ____________________________________________   INITIAL IMPRESSION / ASSESSMENT AND PLAN / ED COURSE  Pertinent labs & imaging results that were available during my care of the patient were reviewed by me and considered in my medical decision making (see chart for details).   Patient is 33 year old female presents emergency department complaining of the left breast itching.  Physical exam shows 2 eczematous areas noted at the aerola.  Exam findings were explained to the patient.  No other breast abnormalities were noted.  However due to family history of breast CA I recommended she follow-up with either her regular doctor or Dr. Dalbert Garnet.  She was given a prescription for betamethasone to apply to the scaly rash.  She states she understands will comply with instructions.  She also states she will call Dr. Dalbert Garnet for a referral for a mammogram.  She was discharged in stable condition.     As part of my medical decision making, I reviewed the following data within the electronic MEDICAL RECORD NUMBER Nursing notes reviewed and incorporated, Old chart reviewed, Notes from prior ED visits and [place] Controlled Substance Database  ____________________________________________   FINAL CLINICAL IMPRESSION(S) / ED DIAGNOSES  Final diagnoses:  Eczema, unspecified type  Breast pain, left      NEW MEDICATIONS STARTED DURING THIS VISIT:  Discharge Medication List as of 04/02/2019  1:53 PM    START taking these  medications   Details  betamethasone dipropionate (DIPROLENE) 0.05 % cream Apply topically 2 (two) times daily., Starting Mon 04/02/2019, Normal         Note:  This document was prepared using Dragon voice recognition software and may include unintentional dictation errors.    Faythe Ghee, PA-C 04/02/19 1543    Emily Filbert, MD 04/03/19 [phone]

## 2019-10-27 ENCOUNTER — Encounter (HOSPITAL_COMMUNITY): Payer: Self-pay | Admitting: Emergency Medicine

## 2019-10-27 ENCOUNTER — Other Ambulatory Visit: Payer: Self-pay

## 2019-10-27 ENCOUNTER — Ambulatory Visit (HOSPITAL_COMMUNITY)
Admission: EM | Admit: 2019-10-27 | Discharge: 2019-10-27 | Disposition: A | Discharge status: Home / Self Care | Payer: Medicaid Other | Attending: Physician Assistant | Admitting: Physician Assistant

## 2019-10-27 DIAGNOSIS — R6884 Jaw pain: Secondary | ICD-10-CM

## 2019-10-27 DIAGNOSIS — K047 Periapical abscess without sinus: Secondary | ICD-10-CM

## 2019-10-27 MED ORDER — TRAMADOL HCL 50 MG PO TABS: 50.0000 mg | ORAL_TABLET | Freq: Three times a day (TID) | ORAL | 0 refills | Status: DC | PRN

## 2019-10-27 MED ORDER — AMOXICILLIN-POT CLAVULANATE 875-125 MG PO TABS: 1.0000 | ORAL_TABLET | Freq: Two times a day (BID) | ORAL | 0 refills | Status: DC

## 2019-10-27 MED ORDER — CEFTRIAXONE SODIUM 1 G IJ SOLR
1.0000 g | Freq: Once | INTRAMUSCULAR | Status: AC
  Administered 2019-10-27: 12:00:00 1 g via INTRAMUSCULAR

## 2019-10-27 MED ORDER — CEFTRIAXONE SODIUM 1 G IJ SOLR
INTRAMUSCULAR | Status: AC
  Filled 2019-10-27: qty 10

## 2019-10-27 NOTE — Discharge Instructions (Addendum)
Follow up with dentist - list of low cost dental clinics provided to patient. Take medication as prescribed.

## 2019-10-27 NOTE — ED Triage Notes (Signed)
Pt c/o dental pain on the L upper and lower jaw, pain x4 days.

## 2019-10-27 NOTE — ED Provider Notes (Signed)
[facility]    CSN: [id number] Arrival date & time: 10/27/19  1022      History   Chief Complaint Chief Complaint  Patient presents with  . Dental Pain    HPI Amy Mcfarland is a 33 y.o. female.   Patient here concerned with R sided mouth pain x 4 days.  She notes pain, swelling of upper and lower gums, and tenderness. She notes history of dental abscess, most recently last year.  She is taking 800 mg ibuprofen and tylenol w/o relief of pain, last dose ibuprofen 5 hours ago.  She has been using ice w/o relief.  She reports dentist appointment 2nd week of January.       Past Medical History:  Diagnosis Date  . Kidney infection   . Kidney infection   . Migraine   . Migraines   . Miscarriage     Patient Active Problem List   Diagnosis Date Noted  . Contraceptive management 05/17/2016  . Encounter for Depo-Provera contraception 06/28/2013    Past Surgical History:  Procedure Laterality Date  . CESAREAN SECTION  2011  . DILATION AND CURETTAGE OF UTERUS  2006    OB History    Gravida  2   Para  1   Term  1   Preterm      AB  1   Living  1     SAB  1   TAB      Ectopic      Multiple      Live Births  1        Obstetric Comments  LMP 12/25/14         Home Medications    Prior to Admission medications   Medication Sig Start Date End Date Taking? Authorizing Provider  albuterol (PROVENTIL HFA;VENTOLIN HFA) 108 (90 Base) MCG/ACT inhaler Inhale 2 puffs into the lungs every 6 (six) hours as needed for wheezing or shortness of breath. 12/06/17   Enid Derry, PA-C  amoxicillin-clavulanate (AUGMENTIN) 875-125 MG tablet Take 1 tablet by mouth every 12 (twelve) hours. 10/27/19   Evern Core, PA-C  betamethasone dipropionate (DIPROLENE) 0.05 % cream Apply topically 2 (two) times daily. 04/02/19   Fisher, Roselyn Bering, PA-C  SUMAtriptan (IMITREX) 25 MG tablet Take 25 mg by mouth every 2 (two) hours as needed for migraine. May repeat in 2  hours if headache persists or recurs.    [provider]  traMADol (ULTRAM) 50 MG tablet Take 1 tablet (50 mg total) by mouth 3 (three) times daily as needed. 10/27/19   Evern Core, PA-C    Family History Family History  Problem Relation Age of Onset  . Diabetes Mother   . Hypertension Father   . Diabetes Father   . Asthma Maternal Uncle   . Cancer Maternal Grandmother        breast cancer  . Cancer Paternal Grandmother     Social History Social History   Tobacco Use  . Smoking status: Current Some Day Smoker    Packs/day: 0.25    Years: 7.00    Pack years: 1.75    Types: Cigarettes  . Smokeless tobacco: Never Used  Substance Use Topics  . Alcohol use: Yes    Comment: occasionally  . Drug use: No    Comment: Quit Marijuana 01/2017     Allergies   Kiwi extract, Peach [prunus persica], and Strawberry extract   Review of Systems Review of Systems  Constitutional: Negative for chills  and fever.  HENT: Positive for facial swelling. Negative for ear discharge, mouth sores, rhinorrhea, sinus pressure and sinus pain.   Respiratory: Negative for cough and shortness of breath.   Gastrointestinal: Negative for abdominal pain, diarrhea, nausea and vomiting.  Musculoskeletal: Negative for neck pain and neck stiffness.  Skin: Negative for color change, pallor and rash.  Allergic/Immunologic: Negative for immunocompromised state.  Neurological: Negative for dizziness, weakness, numbness and headaches.  Hematological: Negative for adenopathy. Does not bruise/bleed easily.  Psychiatric/Behavioral: Positive for sleep disturbance. Negative for agitation.     Physical Exam Triage Vital Signs ED Triage Vitals  Enc Vitals Group     BP 10/27/19 1054 127/82     Pulse Rate 10/27/19 1054 66     Resp 10/27/19 1054 16     Temp 10/27/19 1054 98.5 F (36.9 C)     Temp src --      SpO2 10/27/19 1054 99 %     Weight --      Height --      Head Circumference --       Peak Flow --      Pain Score 10/27/19 1056 10     Pain Loc --      Pain Edu? --      Excl. in GC? --    No data found.  Updated Vital Signs BP 127/82   Pulse 66   Temp 98.5 F (36.9 C)   Resp 16   LMP 10/26/2019   SpO2 99%   Visual Acuity Right Eye Distance:   Left Eye Distance:   Bilateral Distance:    Right Eye Near:   Left Eye Near:    Bilateral Near:     Physical Exam Vitals and nursing note reviewed.  Constitutional:      General: She is in acute distress (moderate distress noted).     Appearance: She is well-developed. She is not ill-appearing.  HENT:     Head: Normocephalic and atraumatic.     Mouth/Throat:     Mouth: Mucous membranes are moist.     Dentition: Abnormal dentition. Dental tenderness, gingival swelling, dental caries and dental abscesses present.     Tongue: No lesions.     Palate: No mass.     Pharynx: Oropharynx is clear. Uvula midline. No pharyngeal swelling, oropharyngeal exudate, posterior oropharyngeal erythema or uvula swelling.     Tonsils: No tonsillar exudate or tonsillar abscesses. 0 on the right. 0 on the left.   Eyes:     Conjunctiva/sclera: Conjunctivae normal.  Cardiovascular:     Rate and Rhythm: Normal rate and regular rhythm.     Heart sounds: No murmur.  Pulmonary:     Effort: Pulmonary effort is normal. No respiratory distress.     Breath sounds: Normal breath sounds.  Musculoskeletal:        General: No deformity. Normal range of motion.     Cervical back: Normal range of motion and neck supple. No rigidity or tenderness.  Lymphadenopathy:     Cervical: No cervical adenopathy.  Skin:    General: Skin is warm and dry.     Capillary Refill: Capillary refill takes less than 2 seconds.  Neurological:     General: No focal deficit present.     Mental Status: She is alert and oriented to person, place, and time.  Psychiatric:        Mood and Affect: Mood normal.        Behavior: Behavior normal.  UC Treatments  / Results  Labs (all labs ordered are listed, but only abnormal results are displayed) Labs Reviewed - No data to display  EKG   Radiology No results found.  Procedures Procedures (including critical care time)  Medications Ordered in UC Medications  cefTRIAXone (ROCEPHIN) injection 1 g (1 g Intramuscular Given 10/27/19 1140)    Initial Impression / Assessment and Plan / UC Course  I have reviewed the triage vital signs and the nursing notes.  Pertinent labs & imaging results that were available during my care of the patient were reviewed by me and considered in my medical decision making (see chart for details).      Final Clinical Impressions(s) / UC Diagnoses   Final diagnoses:  Jaw pain  Dental abscess     Discharge Instructions     Follow up with dentist - list of low cost dental clinics provided to patient. Take medication as prescribed.     ED Prescriptions    Medication Sig Dispense Auth. Provider   traMADol (ULTRAM) 50 MG tablet Take 1 tablet (50 mg total) by mouth 3 (three) times daily as needed. 6 tablet Peri Jefferson, PA-C   amoxicillin-clavulanate (AUGMENTIN) 875-125 MG tablet Take 1 tablet by mouth every 12 (twelve) hours. 14 tablet Peri Jefferson, PA-C     I have reviewed the PDMP during this encounter.   Peri Jefferson, PA-C 10/27/19 1143

## 2019-10-30 ENCOUNTER — Other Ambulatory Visit: Payer: Self-pay

## 2019-10-30 ENCOUNTER — Ambulatory Visit (HOSPITAL_COMMUNITY)
Admission: EM | Admit: 2019-10-30 | Discharge: 2019-10-30 | Disposition: A | Discharge status: Home / Self Care | Payer: Medicaid Other | Attending: Family Medicine | Admitting: Family Medicine

## 2019-10-30 ENCOUNTER — Encounter (HOSPITAL_COMMUNITY): Payer: Self-pay

## 2019-10-30 DIAGNOSIS — Z3202 Encounter for pregnancy test, result negative: Secondary | ICD-10-CM

## 2019-10-30 DIAGNOSIS — K0889 Other specified disorders of teeth and supporting structures: Secondary | ICD-10-CM | POA: Diagnosis not present

## 2019-10-30 LAB — POCT PREGNANCY, URINE: Preg Test, Ur: NEGATIVE

## 2019-10-30 LAB — POC URINE PREG, ED: Preg Test, Ur: NEGATIVE

## 2019-10-30 MED ORDER — HYDROCODONE-ACETAMINOPHEN 5-325 MG PO TABS: 1.0000 | ORAL_TABLET | Freq: Four times a day (QID) | ORAL | 0 refills | Status: DC | PRN

## 2019-10-30 MED ORDER — CLINDAMYCIN HCL 300 MG PO CAPS: 300.0000 mg | ORAL_CAPSULE | Freq: Three times a day (TID) | ORAL | 0 refills | Status: DC

## 2019-10-30 NOTE — Discharge Instructions (Addendum)
Be aware, pain medications may cause drowsiness. Please do not drive, operate heavy machinery or make important decisions while on this medication, it can cloud your judgement.  

## 2019-10-30 NOTE — ED Triage Notes (Signed)
Patient presents to Urgent Care with complaints of continued dental pain and swelling since she was seen for the same three days ago. Patient reports she has not been able to get in with a dentist sooner than the first of January, pt denies difficulty swallowing.

## 2019-10-31 NOTE — ED Provider Notes (Signed)
[facility]   [id number] 10/30/19 Arrival Time: 1528  ASSESSMENT & PLAN:  1. Pain, dental     No sign of abscess requiring I&D at this time. Discussed.  Meds ordered this encounter  Medications  . HYDROcodone-acetaminophen (NORCO/VICODIN) 5-325 MG tablet    Sig: Take 1 tablet by mouth every 6 (six) hours as needed for moderate pain or severe pain.    Dispense:  12 tablet    Refill:  0  . clindamycin (CLEOCIN) 300 MG capsule    Sig: Take 1 capsule (300 mg total) by mouth 3 (three) times daily.    Dispense:  30 capsule    Refill:  0    [place] Controlled Substances Registry consulted for this patient. I feel the risk/benefit ratio today is favorable for proceeding with this prescription for a controlled substance. Medication sedation precautions given.  Dental resource written instructions given. She will schedule dental evaluation as soon as possible if not improving over the next 24-48 hours.  Reviewed expectations re: course of current medical issues. Questions answered. Outlined signs and symptoms indicating need for more acute intervention. Patient verbalized understanding. After Visit Summary given.   SUBJECTIVE: Seen on 10/27/2019 for the same. Continued pain. Amy Mcfarland is a 33 y.o. female who reports gradual onset of right rear lower dental pain described as aching/throbbing. Present for a couple of weeks. Fever: absent. Tolerating PO intake but reports pain with chewing. Normal swallowing. She does not see a dentist regularly but has an appointment the first week of January. No neck swelling or pain. OTC analgesics without relief.  ROS: As per HPI. All other systems negative.   OBJECTIVE: Vitals:   10/30/19 1543  BP: (!) 141/89  Pulse: 68  Resp: 14  Temp: 98.8 F (37.1 C)  TempSrc: Oral  SpO2: 99%    General appearance: alert; no distress HENT: normocephalic; atraumatic; dentition: poor; right lower gums without areas of fluctuance, drainage,  or bleeding and with tenderness to palpation; normal jaw movement without difficulty Neck: supple without LAD; FROM; trachea midline CV: RRR Lungs: normal respirations; unlabored Skin: warm and dry Psychological: alert and cooperative; normal mood and affect  Allergies  Allergen Reactions  . Kiwi Extract Other (See Comments)    Makes mouth feel hairy  . Peach [Prunus Persica] Other (See Comments)    Makes mouth feel hairy  . Strawberry Extract Other (See Comments)    Makes mouth feel like it has hair in it    Past Medical History:  Diagnosis Date  . Kidney infection   . Kidney infection   . Migraine   . Migraines   . Miscarriage    Social History   Socioeconomic History  . Marital status: Single    Spouse name: Not on file  . Number of children: Not on file  . Years of education: Not on file  . Highest education level: Not on file  Occupational History  . Not on file  Tobacco Use  . Smoking status: Current Some Day Smoker    Packs/day: 0.25    Years: 7.00    Pack years: 1.75    Types: Cigarettes  . Smokeless tobacco: Never Used  Substance and Sexual Activity  . Alcohol use: Yes    Comment: occasionally  . Drug use: No    Comment: Quit Marijuana 01/2017  . Sexual activity: Not on file  Other Topics Concern  . Not on file  Social History Narrative  . Not on file  Social Determinants of Health   Financial Resource Strain:   . Difficulty of Paying Living Expenses: Not on file  Food Insecurity:   . Worried About Charity fundraiser in the Last Year: Not on file  . Ran Out of Food in the Last Year: Not on file  Transportation Needs:   . Lack of Transportation (Medical): Not on file  . Lack of Transportation (Non-Medical): Not on file  Physical Activity:   . Days of Exercise per Week: Not on file  . Minutes of Exercise per Session: Not on file  Stress:   . Feeling of Stress : Not on file  Social Connections:   . Frequency of Communication with Friends and  Family: Not on file  . Frequency of Social Gatherings with Friends and Family: Not on file  . Attends Religious Services: Not on file  . Active Member of Clubs or Organizations: Not on file  . Attends Archivist Meetings: Not on file  . Marital Status: Not on file  Intimate Partner Violence:   . Fear of Current or Ex-Partner: Not on file  . Emotionally Abused: Not on file  . Physically Abused: Not on file  . Sexually Abused: Not on file   Family History  Problem Relation Age of Onset  . Diabetes Mother   . Hypertension Father   . Diabetes Father   . Asthma Maternal Uncle   . Cancer Maternal Grandmother        breast cancer  . Cancer Paternal Grandmother    Past Surgical History:  Procedure Laterality Date  . CESAREAN SECTION  2011  . DILATION AND CURETTAGE OF UTERUS  2006     Vanessa Kick, MD 10/31/19 1026

## 2019-11-01 DIAGNOSIS — M0890 Juvenile arthritis, unspecified, unspecified site: Secondary | ICD-10-CM

## 2019-11-02 ENCOUNTER — Ambulatory Visit: Payer: Self-pay

## 2019-11-22 ENCOUNTER — Encounter: Payer: Self-pay | Admitting: Family Medicine

## 2019-11-22 ENCOUNTER — Ambulatory Visit (LOCAL_COMMUNITY_HEALTH_CENTER): Payer: Medicaid Other | Admitting: Family Medicine

## 2019-11-22 ENCOUNTER — Other Ambulatory Visit: Payer: Self-pay

## 2019-11-22 VITALS — BP 128/85 | Wt 300.0 lb

## 2019-11-22 DIAGNOSIS — G43009 Migraine without aura, not intractable, without status migrainosus: Secondary | ICD-10-CM

## 2019-11-22 DIAGNOSIS — Z30013 Encounter for initial prescription of injectable contraceptive: Secondary | ICD-10-CM | POA: Diagnosis not present

## 2019-11-22 DIAGNOSIS — Z720 Tobacco use: Secondary | ICD-10-CM

## 2019-11-22 DIAGNOSIS — J302 Other seasonal allergic rhinitis: Secondary | ICD-10-CM

## 2019-11-22 MED ORDER — MEDROXYPROGESTERONE ACETATE 150 MG/ML IM SUSP: 150.0000 mg | Freq: Once | INTRAMUSCULAR | Status: DC

## 2019-11-22 MED ORDER — MEDROXYPROGESTERONE ACETATE 150 MG/ML IM SUSP
150.0000 mg | INTRAMUSCULAR | Status: AC
  Administered 2019-11-22 – 2020-10-23 (×3): 150 mg via INTRAMUSCULAR

## 2019-11-22 MED ORDER — THERA VITAL M PO TABS: 1.0000 | ORAL_TABLET | Freq: Every day | ORAL | 0 refills | Status: AC

## 2019-11-22 NOTE — Progress Notes (Signed)
Declines HIV/RPR/Std Screening today.  Informed needs PE; will schedule when next Depo due. Richmond Campbell, RN

## 2019-11-22 NOTE — Progress Notes (Signed)
Family Planning Visit  Subjective:  Amy Mcfarland is a 34 y.o. being seen today for  Chief Complaint  Patient presents with  . Start Depo    Pt has Juvenile arthritis (HCC); Morbid obesity (HCC); Migraine without aura; and Seasonal allergies on their problem list.  HPI  Patient reports she would like to restart depo.   Pt denies all of the following, which are contraindications to Depo use: Known breast cancer Pregnancy Also denies: Hypertension (CDC cat 2 if mild, cat 3 if severe) Severe cirrhosis, hepatocellular adenoma Diabetes with nephrosis or vascular complications Ischemic heart disease or multiple risk factors for atherosclerotic disease, and some forms of lupus Unexplained vaginal bleeding Pregnancy planned within the next year Long-term use of corticosteroid therapy in women with a history of, or risk factors for, nontraumatic (frailty) fractures.  Current use of aminoglutethimide (usually for the treatment of Cushing's syndrome) because aminoglutethimide may increase metabolism of progestins    Patient's last menstrual period was 11/21/2019 (exact date). Last sex: 3 months ago  BCM: condoms always Pt desires EC? n/a  Last pap: 2020 per pt  Patient reports 1 partner(s) in last year. Do they desire STI screening (if no, why not)? Declines, not interested  Does the patient desire a pregnancy in the next year? no   34 y.o., Body mass index is 48.42 kg/m. - Is patient eligible for HA1C diabetes screening based on BMI and age >81?  no  Does the patient have a current or past history of drug use? no No components found for: HCV  See flowsheet for other program required questions.   Health Maintenance Due  Topic Date Due  . TETANUS/TDAP  06/13/2005  . INFLUENZA VACCINE  06/16/2019    ROS  The following portions of the patient's history were reviewed and updated as appropriate: allergies, current medications, past family history, past medical history,  past social history, past surgical history and problem list. Problem list updated.  Objective:  BP 128/85   Wt 300 lb (136.1 kg)   LMP 11/21/2019 (Exact Date)   BMI 48.42 kg/m    Physical Exam  Gen: well appearing, NAD HEENT: no scleral icterus CV: RR Lung: Normal WOB Ext: warm well perfused    Assessment and Plan:  Amy Mcfarland is a 34 y.o. female presenting to the [facility] Department for a well woman exam/family planning visit  Contraception counseling: Reviewed all forms of birth control options in the tiered based approach including abstinence; over the counter/barrier methods; hormonal contraceptive medication including pill, patch, ring, injection, contraceptive implant; hormonal and nonhormonal IUDs; permanent sterilization options including vasectomy and the various tubal sterilization modalities. Risks, benefits, how to discontinue and typical effectiveness rates were reviewed.  Questions were answered.  Written information was also given to the patient to review.  Patient desires depo, this was prescribed for patient. She will follow up in  3 months for surveillance.  She was told to call with any further questions, or with any concerns about this method of contraception.  Emphasized use of condoms 100% of the time for STI prevention.  ECP: n/a   1. Encounter for initial prescription of injectable contraceptive Depo rx today. Counseling as above. Pt reports she needs to establish with a new PCP asap for HA medication refill, PCP handout list given. - medroxyPROGESTERone (DEPO-PROVERA) injection 150 mg  2. Tobacco use Encouraged pt in their desire to quit, counseled using 5 A's. Tobacco Quitline info given and offered referral for  behavioral health which pt declines for now.     Return in about 3 months (around 02/20/2020) for yearly wellness exam.  No future appointments.  Kandee Keen, PA-C

## 2019-12-04 ENCOUNTER — Telehealth: Payer: Self-pay | Admitting: Family Medicine

## 2019-12-04 NOTE — Telephone Encounter (Signed)
Patient feeling symptoms after recently receiving depo shot.

## 2019-12-05 NOTE — Telephone Encounter (Signed)
Consulted by RN re:  patient concern.  Reviewed RN note and agree with RN documentation being what we discussed.

## 2019-12-05 NOTE — Telephone Encounter (Signed)
Returned call to patient. Per female on the phone, patient unavailable at this time. Female who answered phone counseled that client can call back this afternoon before 5pm or after 8am tomorrow, phone number given.Burt Knack, RN

## 2019-12-05 NOTE — Telephone Encounter (Signed)
pt returning phone call 

## 2019-12-05 NOTE — Telephone Encounter (Signed)
TC to patient who states that about 1 week after her last Depo on 11/22/2019 she started having sore nipples, enlarged areolas and her breasts have felt warm, full and heavy. Patient states LMP ended 11/21/19 and last sex about 3 months ago, none since last Depo. She also states a home pregnancy test was negative. Per consult with provider, C. Rolley Sims, patient counseled that symptoms are likely due to change in hormone levels (Patient states she had not had Depo for about a year before the Depo on 11/22/19), and that she should try wearing a very supportive sports bra all day and at night, and take 400IU vitamin E twice each day for 2 weeks. Patient counseled to call back if these measures don't help with symptoms. Patient agrees with plan and states she just bought a new sports bra.Burt Knack, RN

## 2020-02-12 ENCOUNTER — Ambulatory Visit (LOCAL_COMMUNITY_HEALTH_CENTER): Payer: Medicaid Other

## 2020-02-12 ENCOUNTER — Other Ambulatory Visit: Payer: Self-pay

## 2020-02-12 VITALS — BP 114/73 | Ht 66.0 in | Wt 299.5 lb

## 2020-02-12 DIAGNOSIS — Z3009 Encounter for other general counseling and advice on contraception: Secondary | ICD-10-CM

## 2020-02-12 DIAGNOSIS — Z30013 Encounter for initial prescription of injectable contraceptive: Secondary | ICD-10-CM

## 2020-02-12 DIAGNOSIS — Z3042 Encounter for surveillance of injectable contraceptive: Secondary | ICD-10-CM

## 2020-02-12 MED ORDER — MEDROXYPROGESTERONE ACETATE 150 MG/ML IM SUSP
150.0000 mg | Freq: Once | INTRAMUSCULAR | Status: AC
  Administered 2020-02-12: 150 mg via INTRAMUSCULAR

## 2020-02-12 NOTE — Progress Notes (Signed)
11.5 weeks post depo today. DMPA 150 mg IM administered today per Samara Snide, PA order dated 11/22/2019 (depo order for 11/22/19 through 11/16/20).

## 2020-02-12 NOTE — Addendum Note (Signed)
Addended by: Tracey Harries on: 02/12/2020 11:30 AM   Modules accepted: Orders

## 2020-02-14 ENCOUNTER — Emergency Department (HOSPITAL_COMMUNITY)
Admission: EM | Admit: 2020-02-14 | Discharge: 2020-02-14 | Disposition: A | Discharge status: Home / Self Care | Payer: Medicaid Other | Attending: Emergency Medicine | Admitting: Emergency Medicine

## 2020-02-14 ENCOUNTER — Other Ambulatory Visit: Payer: Self-pay

## 2020-02-14 ENCOUNTER — Emergency Department (HOSPITAL_COMMUNITY): Payer: Medicaid Other

## 2020-02-14 ENCOUNTER — Encounter (HOSPITAL_COMMUNITY): Payer: Self-pay | Admitting: Pediatrics

## 2020-02-14 DIAGNOSIS — M940 Chondrocostal junction syndrome [Tietze]: Secondary | ICD-10-CM | POA: Diagnosis not present

## 2020-02-14 DIAGNOSIS — R0789 Other chest pain: Secondary | ICD-10-CM | POA: Diagnosis present

## 2020-02-14 DIAGNOSIS — R0602 Shortness of breath: Secondary | ICD-10-CM | POA: Insufficient documentation

## 2020-02-14 DIAGNOSIS — R519 Headache, unspecified: Secondary | ICD-10-CM

## 2020-02-14 DIAGNOSIS — R0781 Pleurodynia: Secondary | ICD-10-CM | POA: Insufficient documentation

## 2020-02-14 DIAGNOSIS — Z79899 Other long term (current) drug therapy: Secondary | ICD-10-CM | POA: Insufficient documentation

## 2020-02-14 DIAGNOSIS — Z720 Tobacco use: Secondary | ICD-10-CM | POA: Insufficient documentation

## 2020-02-14 LAB — HEPATIC FUNCTION PANEL
ALT: 20 U/L (ref 0–44)
AST: 17 U/L (ref 15–41)
Albumin: 3.3 g/dL — ABNORMAL LOW (ref 3.5–5.0)
Alkaline Phosphatase: 59 U/L (ref 38–126)
Bilirubin, Direct: <0.1 mg/dL (ref 0.0–0.2)
Total Bilirubin: 0.5 mg/dL (ref 0.3–1.2)
Total Protein: 7.2 g/dL (ref 6.5–8.1)

## 2020-02-14 LAB — BASIC METABOLIC PANEL
Anion gap: 8 (ref 5–15)
BUN: 8 mg/dL (ref 6–20)
CO2: 22 mmol/L (ref 22–32)
Calcium: 8.6 mg/dL — ABNORMAL LOW (ref 8.9–10.3)
Chloride: 109 mmol/L (ref 98–111)
Creatinine, Ser: 1.05 mg/dL — ABNORMAL HIGH (ref 0.44–1.00)
GFR calc Af Amer: >60 mL/min (ref >60)
GFR calc non Af Amer: >60 mL/min (ref >60)
Glucose, Bld: 91 mg/dL (ref 70–99)
Potassium: 4.3 mmol/L (ref 3.5–5.1)
Sodium: 139 mmol/L (ref 135–145)

## 2020-02-14 LAB — D-DIMER, QUANTITATIVE: D-Dimer, Quant: 0.33 ug/mL-FEU (ref 0.00–0.50)

## 2020-02-14 LAB — CBC
HCT: 43.3 % (ref 36.0–46.0)
Hemoglobin: 13.8 g/dL (ref 12.0–15.0)
MCH: 29.6 pg (ref 26.0–34.0)
MCHC: 31.9 g/dL (ref 30.0–36.0)
MCV: 92.9 fL (ref 80.0–100.0)
Platelets: 294 10*3/uL (ref 150–400)
RBC: 4.66 MIL/uL (ref 3.87–5.11)
RDW: 14.6 % (ref 11.5–15.5)
WBC: 12.6 10*3/uL — ABNORMAL HIGH (ref 4.0–10.5)
nRBC: 0 % (ref 0.0–0.2)

## 2020-02-14 LAB — TROPONIN I (HIGH SENSITIVITY)
Troponin I (High Sensitivity): 4 ng/L (ref <18)
Troponin I (High Sensitivity): 4 ng/L (ref <18)

## 2020-02-14 LAB — I-STAT BETA HCG BLOOD, ED (MC, WL, AP ONLY): I-stat hCG, quantitative: <5 m[IU]/mL (ref <5)

## 2020-02-14 LAB — LIPASE, BLOOD: Lipase: 24 U/L (ref 11–51)

## 2020-02-14 MED ORDER — NAPROXEN 500 MG PO TABS: 500.0000 mg | ORAL_TABLET | Freq: Two times a day (BID) | ORAL | 0 refills | Status: DC

## 2020-02-14 MED ORDER — SUMATRIPTAN SUCCINATE 25 MG PO TABS: 25.0000 mg | ORAL_TABLET | ORAL | 0 refills | Status: DC | PRN

## 2020-02-14 MED ORDER — FAMOTIDINE 20 MG PO TABS: 20.0000 mg | ORAL_TABLET | Freq: Two times a day (BID) | ORAL | 0 refills | Status: DC

## 2020-02-14 MED ORDER — KETOROLAC TROMETHAMINE 30 MG/ML IJ SOLN
30.0000 mg | Freq: Once | INTRAMUSCULAR | Status: AC
  Administered 2020-02-14: 30 mg via INTRAVENOUS
  Filled 2020-02-14: qty 1

## 2020-02-14 MED ORDER — SODIUM CHLORIDE 0.9% FLUSH: 3.0000 mL | Freq: Once | INTRAVENOUS | Status: DC

## 2020-02-14 NOTE — ED Notes (Signed)
Taken to XRAY

## 2020-02-14 NOTE — Discharge Instructions (Signed)
1.  Take naproxen twice a day for the next 7 days.  Take Pepcid as well to protect your stomach and esophagus from irritation. 2.  Get a family doctor for follow-up and recheck.  A resource guide was attached for facilities that offer low-cost medical care.  You may also use the referral number in your discharge instructions for assistance in finding a provider.

## 2020-02-14 NOTE — ED Provider Notes (Signed)
[facility] EMERGENCY DEPARTMENT Provider Note   CSN: [id number] Arrival date & time: 02/14/20  0913     History Chief Complaint  Patient presents with  . Chest Pain    Amy Mcfarland is a 34 y.o. female.  HPI Patient reports she started getting chest pain at 3 AM in the morning.  She reports it awakened her and she was very uncomfortable.  It persisted but she did go to work at 11 AM.  She reports she worked until 3 but then still is in quite a bit of pain.  The pain is in her left anterior chest.  Hurts when she moves or breathes.  She reports then she started to feel short of breath and like her heart was racing.  She tried some ibuprofen earlier without relief.  No syncopal episode.  Patient reports she felt like she was going to pass out.  Swelling or pain of the lower legs.  No recent cough or fever.  Patient reports history of juvenile arthritis but denies taking any specific medications for this.  She reports it is mostly confined to her hands.    Past Medical History:  Diagnosis Date  . Kidney infection   . Kidney infection   . Migraine   . Migraines   . Miscarriage     Patient Active Problem List   Diagnosis Date Noted  . Migraine without aura 11/22/2019  . Seasonal allergies 11/22/2019  . Juvenile arthritis ([place]) 07/06/2018  . Morbid obesity ([place]) 07/06/2018    Past Surgical History:  Procedure Laterality Date  . CESAREAN SECTION  2011  . DILATION AND CURETTAGE OF UTERUS  2006     OB History    Gravida  2   Para  1   Term  1   Preterm      AB  1   Living  1     SAB  1   TAB      Ectopic      Multiple      Live Births  1        Obstetric Comments  LMP 12/25/14        Family History  Problem Relation Age of Onset  . Diabetes Mother   . Hypertension Father   . Diabetes Father   . Asthma Maternal Uncle   . Cancer Maternal Grandmother        breast cancer  . Stomach cancer Paternal Grandfather     Social  History   Tobacco Use  . Smoking status: Current Some Day Smoker    Packs/day: 0.20    Years: 7.00    Pack years: 1.40    Types: Cigarettes  . Smokeless tobacco: Never Used  Substance Use Topics  . Alcohol use: Yes    Alcohol/week: 2.0 standard drinks    Types: 2 Glasses of wine per week    Comment: occasionally  . Drug use: No    Comment: Quit Marijuana 01/2017    Home Medications Prior to Admission medications   Medication Sig Start Date End Date Taking? Authorizing Provider  albuterol (PROVENTIL HFA;VENTOLIN HFA) 108 (90 Base) MCG/ACT inhaler Inhale 2 puffs into the lungs every 6 (six) hours as needed for wheezing or shortness of breath. 12/06/17  Yes Laban Emperor, PA-C  ibuprofen (ADVIL) 200 MG tablet Take 800 mg by mouth every 6 (six) hours as needed for moderate pain.   Yes [provider]  famotidine (PEPCID) 20 MG tablet Take  1 tablet (20 mg total) by mouth 2 (two) times daily. 02/14/20   Arby Barrette, MD  Multiple Vitamins-Minerals (MULTIVITAMIN) tablet Take 1 tablet by mouth daily. Patient not taking: Reported on 02/14/2020 11/22/19 03/01/20  Cherlynn Polo, Eileen Stanford L, PA-C  naproxen (NAPROSYN) 500 MG tablet Take 1 tablet (500 mg total) by mouth 2 (two) times daily. 02/14/20   Arby Barrette, MD  SUMAtriptan (IMITREX) 25 MG tablet Take 1 tablet (25 mg total) by mouth every 2 (two) hours as needed for migraine. May repeat in 2 hours if headache persists or recurs.  Maximum of 3 tablets/day. 02/14/20   Arby Barrette, MD    Allergies    Kiwi extract, Peach [prunus persica], and Strawberry extract  Review of Systems   Review of Systems 10 Systems reviewed and are negative for acute change except as noted in the HPI.  Physical Exam Updated Vital Signs BP (!) 115/56   Pulse 66   Temp 98.3 F (36.8 C) (Oral)   Resp (!) 22   Ht 5\' 6"  (1.676 m)   Wt 123.4 kg   SpO2 99%   BMI 43.90 kg/m   Physical Exam Constitutional:      Appearance: Normal appearance. She is  well-developed.  HENT:     Head: Normocephalic and atraumatic.  Eyes:     Pupils: Pupils are equal, round, and reactive to light.  Cardiovascular:     Rate and Rhythm: Normal rate and regular rhythm.     Heart sounds: Normal heart sounds.  Pulmonary:     Effort: Pulmonary effort is normal.     Breath sounds: Normal breath sounds.     Comments: Patient has very tender costochondral margins on the left.  Patient winces in retraction palpation at the left sternal costal border.  She does not have any reproducible pain along the right sternocostal border.  Soft tissues otherwise normal. Chest:     Chest wall: Tenderness present.  Abdominal:     General: Bowel sounds are normal. There is no distension.     Palpations: Abdomen is soft.     Tenderness: There is no abdominal tenderness.  Musculoskeletal:        General: No swelling or tenderness. Normal range of motion.     Cervical back: Neck supple.     Right lower leg: No edema.     Left lower leg: No edema.  Skin:    General: Skin is warm and dry.  Neurological:     Mental Status: She is alert and oriented to person, place, and time.     GCS: GCS eye subscore is 4. GCS verbal subscore is 5. GCS motor subscore is 6.     Coordination: Coordination normal.     ED Results / Procedures / Treatments   Labs (all labs ordered are listed, but only abnormal results are displayed) Labs Reviewed  BASIC METABOLIC PANEL - Abnormal; Notable for the following components:      Result Value   Creatinine, Ser 1.05 (*)    Calcium 8.6 (*)    All other components within normal limits  CBC - Abnormal; Notable for the following components:   WBC 12.6 (*)    All other components within normal limits  HEPATIC FUNCTION PANEL - Abnormal; Notable for the following components:   Albumin 3.3 (*)    All other components within normal limits  LIPASE, BLOOD  D-DIMER, QUANTITATIVE (NOT AT [facility])  I-STAT BETA HCG BLOOD, ED ([facility], [facility], [facility] ONLY)  TROPONIN I  (  HIGH SENSITIVITY)  TROPONIN I (HIGH SENSITIVITY)    EKG EKG Interpretation  Date/Time:  Thursday February 14 2020 09:31:04 EDT Ventricular Rate:  70 PR Interval:    QRS Duration: 87 QT Interval:  421 QTC Calculation: 455 R Axis:   12 Text Interpretation: Sinus rhythm normal, no sig change from old Confirmed by Arby Barrette [phone]) on 02/14/2020 1:07:33 PM   Radiology DG Chest 2 View  Result Date: 02/14/2020 CLINICAL DATA:  34 year old with chest pain. EXAM: CHEST - 2 VIEW COMPARISON:  08/08/2017 FINDINGS: Both lungs are clear. Heart and mediastinum are within normal limits. Trachea is midline. No pulmonary edema. Negative for pneumothorax. No large pleural effusions. Bone structures are unremarkable. IMPRESSION: No active cardiopulmonary disease. Electronically Signed   By: Richarda Overlie M.D.   On: 02/14/2020 09:56    Procedures Procedures (including critical care time)  Medications Ordered in ED Medications  sodium chloride flush (NS) 0.9 % injection 3 mL (3 mLs Intravenous Not Given 02/14/20 1130)  ketorolac (TORADOL) 30 MG/ML injection 30 mg (30 mg Intravenous Given 02/14/20 1303)    ED Course  I have reviewed the triage vital signs and the nursing notes.  Pertinent labs & imaging results that were available during my care of the patient were reviewed by me and considered in my medical decision making (see chart for details).    MDM Rules/Calculators/A&P                      Patient is clinically well.  She describes chest pain has been going on since yesterday.  2 sets of troponins are negative.  D-dimer is not elevated.  Patient has very reproducible chest wall pain.  At this time findings most consistent with costochondritis.  Will initiate naproxen for pain and Pepcid for GI protective effect.  Patient also requests a refill on her Imitrex.  She does not currently have headache but reports she does get migraines and would like a refill.  She is counseled to follow-up with the  PCP.  She is given resource guide to assist in finding a primary care treatment. Final Clinical Impression(s) / ED Diagnoses Final diagnoses:  Costochondritis, acute    Rx / DC Orders ED Discharge Orders         Ordered    naproxen (NAPROSYN) 500 MG tablet  2 times daily     02/14/20 1300    famotidine (PEPCID) 20 MG tablet  2 times daily     02/14/20 1300    SUMAtriptan (IMITREX) 25 MG tablet  Every 2 hours PRN     02/14/20 1302           Arby Barrette, MD 02/14/20 1308

## 2020-02-14 NOTE — ED Triage Notes (Signed)
Came in via EMS; patient arrived from work. NTG x 1 + ASA 324 given. 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 Stated hx of migraines and takes ibuprofen for it, denies other PMH.

## 2020-04-19 ENCOUNTER — Encounter (HOSPITAL_COMMUNITY): Payer: Self-pay | Admitting: Emergency Medicine

## 2020-04-19 ENCOUNTER — Other Ambulatory Visit: Payer: Self-pay

## 2020-04-19 ENCOUNTER — Emergency Department (HOSPITAL_COMMUNITY)
Admission: EM | Admit: 2020-04-19 | Discharge: 2020-04-19 | Disposition: A | Discharge status: Home / Self Care | Payer: Medicaid Other | Attending: Emergency Medicine | Admitting: Emergency Medicine

## 2020-04-19 DIAGNOSIS — N939 Abnormal uterine and vaginal bleeding, unspecified: Secondary | ICD-10-CM | POA: Diagnosis present

## 2020-04-19 DIAGNOSIS — Z5321 Procedure and treatment not carried out due to patient leaving prior to being seen by health care provider: Secondary | ICD-10-CM | POA: Diagnosis not present

## 2020-04-19 LAB — URINALYSIS, ROUTINE W REFLEX MICROSCOPIC
Bilirubin Urine: NEGATIVE
Glucose, UA: NEGATIVE mg/dL
Ketones, ur: NEGATIVE mg/dL
Leukocytes,Ua: NEGATIVE
Nitrite: NEGATIVE
Protein, ur: 30 mg/dL — AB
Specific Gravity, Urine: 1.02 (ref 1.005–1.030)
pH: 6 (ref 5.0–8.0)

## 2020-04-19 LAB — COMPREHENSIVE METABOLIC PANEL
ALT: 26 U/L (ref 0–44)
AST: 17 U/L (ref 15–41)
Albumin: 3.6 g/dL (ref 3.5–5.0)
Alkaline Phosphatase: 63 U/L (ref 38–126)
Anion gap: 6 (ref 5–15)
BUN: 7 mg/dL (ref 6–20)
CO2: 21 mmol/L — ABNORMAL LOW (ref 22–32)
Calcium: 8.8 mg/dL — ABNORMAL LOW (ref 8.9–10.3)
Chloride: 110 mmol/L (ref 98–111)
Creatinine, Ser: 0.83 mg/dL (ref 0.44–1.00)
GFR calc Af Amer: >60 mL/min (ref >60)
GFR calc non Af Amer: >60 mL/min (ref >60)
Glucose, Bld: 104 mg/dL — ABNORMAL HIGH (ref 70–99)
Potassium: 3.6 mmol/L (ref 3.5–5.1)
Sodium: 137 mmol/L (ref 135–145)
Total Bilirubin: 0.3 mg/dL (ref 0.3–1.2)
Total Protein: 7.5 g/dL (ref 6.5–8.1)

## 2020-04-19 LAB — CBC
HCT: 45.8 % (ref 36.0–46.0)
Hemoglobin: 14.6 g/dL (ref 12.0–15.0)
MCH: 29.3 pg (ref 26.0–34.0)
MCHC: 31.9 g/dL (ref 30.0–36.0)
MCV: 91.8 fL (ref 80.0–100.0)
Platelets: 282 10*3/uL (ref 150–400)
RBC: 4.99 MIL/uL (ref 3.87–5.11)
RDW: 14.5 % (ref 11.5–15.5)
WBC: 12.4 10*3/uL — ABNORMAL HIGH (ref 4.0–10.5)
nRBC: 0 % (ref 0.0–0.2)

## 2020-04-19 LAB — LIPASE, BLOOD: Lipase: 25 U/L (ref 11–51)

## 2020-04-19 LAB — I-STAT BETA HCG BLOOD, ED (MC, WL, AP ONLY): I-stat hCG, quantitative: <5 m[IU]/mL (ref <5)

## 2020-04-19 MED ORDER — SODIUM CHLORIDE 0.9% FLUSH: 3.0000 mL | Freq: Once | INTRAVENOUS | Status: DC

## 2020-04-19 NOTE — ED Triage Notes (Signed)
+   home pregnancy test on Monday, negative test Thursday.  Reports vaginal bleeding (spotting) yesterday and stopped.  Put tampon in and had no bleeding.  States heavy vaginal bleeding since this morning.  C/o Lower abd pain and vaginal pressure today.  States she is on Depo.

## 2020-04-19 NOTE — ED Notes (Signed)
Pt left AMA, seen walking out the lobby

## 2020-04-21 ENCOUNTER — Ambulatory Visit (LOCAL_COMMUNITY_HEALTH_CENTER): Payer: Medicaid Other

## 2020-04-21 ENCOUNTER — Other Ambulatory Visit: Payer: Self-pay

## 2020-04-21 VITALS — BP 127/85 | Ht 66.0 in | Wt 291.0 lb

## 2020-04-21 DIAGNOSIS — Z3042 Encounter for surveillance of injectable contraceptive: Secondary | ICD-10-CM

## 2020-04-21 DIAGNOSIS — Z30013 Encounter for initial prescription of injectable contraceptive: Secondary | ICD-10-CM

## 2020-04-21 DIAGNOSIS — Z3009 Encounter for other general counseling and advice on contraception: Secondary | ICD-10-CM | POA: Diagnosis not present

## 2020-04-21 NOTE — Progress Notes (Signed)
Pt. Here for depo. Pt. Is 9 wks 6 days post depo. Pt. Reports transportation difficulties and asks if can have depo today even though early. Consult with Beatris Si, PA who advises RN to discuss risks assoc. With early depo, esp. Hair loss and pt. May have depo today if she wants to continue. RN discussed risks with pt. Pt. Verbalizes understanding of risks and wants to proceed with depo today. Depo 150 mg IM given today per verbal order C. [place], [place]. Pt. Counseled returning 11-13 weeks for next depo. Approx. 07/07/20. Reminder given. Jerel Shepherd, RN

## 2020-04-21 NOTE — Progress Notes (Signed)
Consulted by RN re:  patient early for Depo and is currently 9w 6d since last injection.  Reviewed RN documentation and agree with note as written that it reflects what we discussed; counseling patient on possible SE of early Depo and that patient can receive if is ok with SE.

## 2020-07-08 ENCOUNTER — Ambulatory Visit: Payer: Medicaid Other

## 2020-07-14 ENCOUNTER — Emergency Department (HOSPITAL_COMMUNITY)
Admission: EM | Admit: 2020-07-14 | Discharge: 2020-07-14 | Disposition: A | Discharge status: Home / Self Care | Payer: Medicaid Other | Attending: Emergency Medicine | Admitting: Emergency Medicine

## 2020-07-14 ENCOUNTER — Other Ambulatory Visit: Payer: Self-pay

## 2020-07-14 ENCOUNTER — Emergency Department (HOSPITAL_COMMUNITY): Payer: Medicaid Other

## 2020-07-14 DIAGNOSIS — Y9302 Activity, running: Secondary | ICD-10-CM | POA: Insufficient documentation

## 2020-07-14 DIAGNOSIS — W25XXXA Contact with sharp glass, initial encounter: Secondary | ICD-10-CM | POA: Insufficient documentation

## 2020-07-14 DIAGNOSIS — Z23 Encounter for immunization: Secondary | ICD-10-CM | POA: Insufficient documentation

## 2020-07-14 DIAGNOSIS — F17210 Nicotine dependence, cigarettes, uncomplicated: Secondary | ICD-10-CM | POA: Insufficient documentation

## 2020-07-14 DIAGNOSIS — Z79899 Other long term (current) drug therapy: Secondary | ICD-10-CM | POA: Insufficient documentation

## 2020-07-14 DIAGNOSIS — Y999 Unspecified external cause status: Secondary | ICD-10-CM | POA: Diagnosis not present

## 2020-07-14 DIAGNOSIS — S81812A Laceration without foreign body, left lower leg, initial encounter: Secondary | ICD-10-CM | POA: Insufficient documentation

## 2020-07-14 DIAGNOSIS — Y929 Unspecified place or not applicable: Secondary | ICD-10-CM | POA: Diagnosis not present

## 2020-07-14 DIAGNOSIS — T148XXA Other injury of unspecified body region, initial encounter: Secondary | ICD-10-CM

## 2020-07-14 LAB — I-STAT BETA HCG BLOOD, ED (MC, WL, AP ONLY): I-stat hCG, quantitative: <5 m[IU]/mL (ref <5)

## 2020-07-14 MED ORDER — TETANUS-DIPHTH-ACELL PERTUSSIS 5-2.5-18.5 LF-MCG/0.5 IM SUSP
0.5000 mL | Freq: Once | INTRAMUSCULAR | Status: AC
  Administered 2020-07-14: 0.5 mL via INTRAMUSCULAR
  Filled 2020-07-14: qty 0.5

## 2020-07-14 MED ORDER — MELOXICAM 7.5 MG PO TABS: 7.5000 mg | ORAL_TABLET | Freq: Two times a day (BID) | ORAL | 0 refills | Status: AC | PRN

## 2020-07-14 MED ORDER — BACITRACIN ZINC 500 UNIT/GM EX OINT
1.0000 "application " | TOPICAL_OINTMENT | Freq: Two times a day (BID) | CUTANEOUS | Status: DC
  Filled 2020-07-14: qty 0.9

## 2020-07-14 MED ORDER — LIDOCAINE-EPINEPHRINE 1 %-1:100000 IJ SOLN
10.0000 mL | Freq: Once | INTRAMUSCULAR | Status: AC
  Administered 2020-07-14: 10 mL
  Filled 2020-07-14: qty 1

## 2020-07-14 NOTE — ED Triage Notes (Signed)
Pt arrives with large glass shard sticking out of R thigh after running into and falling into a glass door just PTA. Multiple other abrasions and small cuts on R side of body.

## 2020-07-14 NOTE — Discharge Instructions (Addendum)
You have 7 stitches in your leg, they will need to come out in approximately 7 days, as long as 10 days, take naproxen or Mobic as prescribed, keep an antibiotic ointment on this area, change her dressing twice a day, seek medical exam for increasing pain redness swelling drainage or fever

## 2020-07-14 NOTE — ED Provider Notes (Signed)
[facility] EMERGENCY DEPARTMENT Provider Note   CSN: [id number] Arrival date & time: 07/14/20  1729     History Chief Complaint  Patient presents with  . Fall  . Leg Injury    Amy Mcfarland is a 34 y.o. female.  HPI   34  Y/o female - fell through a glass door that she walked through thinking that it was open - ended up with R distal lateral thigh glass FB and laceration - feels there may be other small shards - she is not UTD on tetanus - sx are moderate - worse with palpation and not assocaited with much bleeding - this occurred 40  minutes ago.  Past Medical History:  Diagnosis Date  . Kidney infection   . Kidney infection   . Migraine   . Migraines   . Miscarriage     Patient Active Problem List   Diagnosis Date Noted  . Migraine without aura 11/22/2019  . Seasonal allergies 11/22/2019  . 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 Morbid obesity (HCC) 07/06/2018    Past Surgical History:  Procedure Laterality Date  . CESAREAN SECTION  2011  . DILATION AND CURETTAGE OF UTERUS  2006     OB History    Gravida  2   Para  1   Term  1   Preterm      AB  1   Living  1     SAB  1   TAB      Ectopic      Multiple      Live Births  1        Obstetric Comments  LMP 12/25/14        Family History  Problem Relation Age of Onset  . Diabetes Mother   . Hypertension Father   . Diabetes Father   . Asthma Maternal Uncle   . Cancer Maternal Grandmother        breast cancer  . Stomach cancer Paternal Grandfather     Social History   Tobacco Use  . Smoking status: Current Some Day Smoker    Packs/day: 0.20    Years: 7.00    Pack years: 1.40    Types: Cigarettes  . Smokeless tobacco: Never Used  Vaping Use  . Vaping Use: Never used  Substance Use Topics  . Alcohol use: Yes    Alcohol/week: 2.0 standard drinks    Types: 2 Glasses of wine per week    Comment: occasionally  . Drug use: No    Comment: Quit Marijuana  01/2017    Home Medications Prior to Admission medications   Medication Sig Start Date End Date Taking? Authorizing Provider  albuterol (PROVENTIL HFA;VENTOLIN HFA) 108 (90 Base) MCG/ACT inhaler Inhale 2 puffs into the lungs every 6 (six) hours as needed for wheezing or shortness of breath. Patient not taking: Reported on 04/19/2020 12/06/17   Enid Derry, PA-C  famotidine (PEPCID) 20 MG tablet Take 1 tablet (20 mg total) by mouth 2 (two) times daily. Patient not taking: Reported on 04/19/2020 02/14/20   Arby Barrette, MD  ibuprofen (ADVIL) 200 MG tablet Take 800 mg by mouth every 4 (four) hours as needed for headache or moderate pain.     [provider]  medroxyPROGESTERone (DEPO-PROVERA) 150 MG/ML injection Inject 150 mg into the muscle every 3 (three) months.    [provider]  meloxicam (MOBIC) 7.5 MG tablet Take 1 tablet (7.5 mg total) by mouth 2 (two)  times daily as needed for up to 14 days for pain. 07/14/20 07/28/20  Eber HongMiller, Plato Alspaugh, MD  naproxen (NAPROSYN) 500 MG tablet Take 1 tablet (500 mg total) by mouth 2 (two) times daily. Patient not taking: Reported on 04/19/2020 02/14/20   Arby BarrettePfeiffer, Marcy, MD  SUMAtriptan (IMITREX) 25 MG tablet Take 1 tablet (25 mg total) by mouth every 2 (two) hours as needed for migraine. May repeat in 2 hours if headache persists or recurs.  Maximum of 3 tablets/day. 02/14/20   Arby BarrettePfeiffer, Marcy, MD    Allergies    Kiwi extract, Peach [prunus persica], and Strawberry extract  Review of Systems   Review of Systems  All other systems reviewed and are negative.   Physical Exam Updated Vital Signs BP 130/83   Pulse (!) 102   Temp 99.3 F (37.4 C) (Oral)   Resp 16   Ht 1.651 m (5\' 5" )   Wt 117.9 kg   SpO2 96%   BMI 43.27 kg/m   Physical Exam Vitals and nursing note reviewed.  Constitutional:      General: She is not in acute distress.    Appearance: She is well-developed.  HENT:     Head: Normocephalic and atraumatic.      Mouth/Throat:     Pharynx: No oropharyngeal exudate.  Eyes:     General: No scleral icterus.       Right eye: No discharge.        Left eye: No discharge.     Conjunctiva/sclera: Conjunctivae normal.     Pupils: Pupils are equal, round, and reactive to light.  Neck:     Thyroid: No thyromegaly.     Vascular: No JVD.  Cardiovascular:     Rate and Rhythm: Normal rate and regular rhythm.     Heart sounds: Normal heart sounds. No murmur heard.  No friction rub. No gallop.   Pulmonary:     Effort: Pulmonary effort is normal. No respiratory distress.     Breath sounds: Normal breath sounds. No wheezing or rales.  Abdominal:     General: Bowel sounds are normal. There is no distension.     Palpations: Abdomen is soft. There is no mass.     Tenderness: There is no abdominal tenderness.  Musculoskeletal:        General: Tenderness present. Normal range of motion.     Cervical back: Normal range of motion and neck supple.  Lymphadenopathy:     Cervical: No cervical adenopathy.  Skin:    General: Skin is warm and dry.     Findings: No erythema or rash.     Comments: Laceration with visualized glass FB in the R lateral distal thigh - no other FB seen - has some small glass specs on skin but no other repairable lac's.  Neurological:     Mental Status: She is alert.     Coordination: Coordination normal.  Psychiatric:        Behavior: Behavior normal.     ED Results / Procedures / Treatments   Labs (all labs ordered are listed, but only abnormal results are displayed) Labs Reviewed  I-STAT BETA HCG BLOOD, ED ([facility], [facility], [facility] ONLY)    EKG None  Radiology DG Tibia/Fibula Right  Result Date: 07/14/2020 CLINICAL DATA:  History: Fall, right leg laceration, glass injury EXAM: RIGHT TIBIA AND FIBULA - 2 VIEW COMPARISON:  None. FINDINGS: Two view radiograph of the right foreleg demonstrates normal alignment. No fracture or dislocation. Soft tissues are unremarkable.  No retained radiopaque  foreign body. IMPRESSION: Negative. Electronically Signed   By: Helyn Numbers MD   On: 07/14/2020 18:57   DG Femur Min 2 Views Right  Result Date: 07/14/2020 CLINICAL DATA:  Trauma, lacerations EXAM: RIGHT FEMUR 2 VIEWS COMPARISON:  None. FINDINGS: Two view radiograph right femur demonstrates normal alignment. No fracture or dislocation. Soft tissues are unremarkable; no retained radiopaque foreign body is identified. IMPRESSION: Negative. Electronically Signed   By: Helyn Numbers MD   On: 07/14/2020 18:55    Procedures .Foreign Body Removal  Date/Time: 07/14/2020 5:56 PM Performed by: Eber Hong, MD Authorized by: Eber Hong, MD  Body area: skin  Sedation: Patient sedated: no  Patient restrained: no Patient cooperative: yes Localization method: visualized Removal mechanism: forceps Dressing: antibiotic ointment and dressing applied Tendon involvement: none Depth: deep Complexity: simple 1 objects recovered. Objects recovered: 4 cm shard of glass Post-procedure assessment: foreign body removed Patient tolerance: patient tolerated the procedure well with no immediate complications Comments:   Marland KitchenMarland KitchenLaceration Repair  Date/Time: 07/14/2020 7:29 PM Performed by: Eber Hong, MD Authorized by: Eber Hong, MD   Consent:    Consent obtained:  Verbal   Consent given by:  Patient   Risks discussed:  Infection, pain, poor cosmetic result, need for additional repair, nerve damage and retained foreign body Anesthesia (see MAR for exact dosages):    Anesthesia method:  Local infiltration   Local anesthetic:  Lidocaine 1% WITH epi Laceration details:    Location:  Leg   Leg location:  R upper leg   Length (cm):  4   Depth (mm):  6 Repair type:    Repair type:  Complex Pre-procedure details:    Preparation:  Patient was prepped and draped in usual sterile fashion and imaging obtained to evaluate for foreign bodies Exploration:    Hemostasis achieved with:  Direct  pressure   Wound exploration: wound explored through full range of motion and entire depth of wound probed and visualized   Treatment:    Area cleansed with:  Betadine   Amount of cleaning:  Extensive   Irrigation solution:  Sterile saline   Visualized foreign bodies/material removed: yes     Debridement:  Minimal   Undermining:  None Skin repair:    Repair method:  Sutures   Suture size:  4-0   Suture material:  Prolene   Suture technique:  Simple interrupted   Number of sutures:  7 Approximation:    Approximation:  Close Post-procedure details:    Dressing:  Antibiotic ointment and sterile dressing   Patient tolerance of procedure:  Tolerated well, no immediate complications Comments:     The patient's wound had a small piece of avascular tissue that had a fillet appearance, this required debridement, sharp debridement with scissors and forceps.  The rest of the wound was cleansed, irrigated, sutured and had good approximation of the edges    (including critical care time)  Medications Ordered in ED Medications  bacitracin ointment 1 application (has no administration in time range)  Tdap (BOOSTRIX) injection 0.5 mL (0.5 mLs Intramuscular Given 07/14/20 1901)  lidocaine-EPINEPHrine (XYLOCAINE W/EPI) 1 %-1:100000 (with pres) injection 10 mL (10 mLs Other Given 07/14/20 1904)    ED Course  I have reviewed the triage vital signs and the nursing notes.  Pertinent labs & imaging results that were available during my care of the patient were reviewed by me and considered in my medical decision making (see chart for details).  MDM Rules/Calculators/A&P                          The wound was debrided under sharp debridement, foreign bodies were removed, large pieces of glass, imaging revealed no foreign bodies, wound was sutured with 7 sutures, the patient's tetanus was updated, she is stable for discharge  Final Clinical Impression(s) / ED Diagnoses Final diagnoses:    Laceration of left lower extremity, initial encounter  Glass foreign body in skin    Rx / DC Orders ED Discharge Orders         Ordered    meloxicam (MOBIC) 7.5 MG tablet  2 times daily PRN        07/14/20 1931           Eber Hong, MD 07/14/20 1932

## 2020-07-14 NOTE — ED Notes (Signed)
Wound cleaned. Bacitracin and dressing applied.  Supplies provided to patient. Work note provided. Patient verbalized understanding of DC instructions, Rx, follow up care.

## 2020-07-16 ENCOUNTER — Ambulatory Visit: Payer: Medicaid Other

## 2020-07-28 ENCOUNTER — Emergency Department (HOSPITAL_COMMUNITY)
Admission: EM | Admit: 2020-07-28 | Discharge: 2020-07-29 | Disposition: A | Discharge status: Home / Self Care | Payer: Medicaid Other | Attending: Emergency Medicine | Admitting: Emergency Medicine

## 2020-07-28 DIAGNOSIS — W25XXXD Contact with sharp glass, subsequent encounter: Secondary | ICD-10-CM | POA: Diagnosis not present

## 2020-07-28 DIAGNOSIS — F17210 Nicotine dependence, cigarettes, uncomplicated: Secondary | ICD-10-CM | POA: Insufficient documentation

## 2020-07-28 DIAGNOSIS — Z79899 Other long term (current) drug therapy: Secondary | ICD-10-CM | POA: Insufficient documentation

## 2020-07-28 DIAGNOSIS — S71111D Laceration without foreign body, right thigh, subsequent encounter: Secondary | ICD-10-CM | POA: Diagnosis not present

## 2020-07-28 DIAGNOSIS — Z4802 Encounter for removal of sutures: Secondary | ICD-10-CM

## 2020-07-28 NOTE — ED Triage Notes (Signed)
Pt states she had 7 stiches placed in her R thigh on 8/30. Unable to get her sooner to have them out due to work.

## 2020-07-28 NOTE — ED Provider Notes (Signed)
[facility] EMERGENCY DEPARTMENT Provider Note   CSN: [id number] Arrival date & time: 07/28/20  5284     History Chief Complaint  Patient presents with  . Suture / Staple Removal    Amy Mcfarland is a 34 y.o. female.  HPI   Patient with significant medical history of migraines presents to the emergency department with chief complaint of suture removal.  Patient was seen here on 08/30 for a laceration to her right back thigh.  She walked through a glass door which shattered and cut her thigh.  She had received 7 stitches placed on Mobic.  She denies fever, chills, increasing pain around her wound, drainage or discharge.  She states the wound appears to be healing well and has no complaints at this time.  She denies headache, fever, chills, short of breath, chest pain, vomiting, uncontrolled nausea, vomiting, diarrhea, pedal edema.  Past Medical History:  Diagnosis Date  . Kidney infection   . Kidney infection   . Migraine   . Migraines   . Miscarriage     Patient Active Problem List   Diagnosis Date Noted  . Migraine without aura 11/22/2019  . Seasonal allergies 11/22/2019  . 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 Morbid obesity (HCC) 07/06/2018    Past Surgical History:  Procedure Laterality Date  . CESAREAN SECTION  2011  . DILATION AND CURETTAGE OF UTERUS  2006     OB History    Gravida  2   Para  1   Term  1   Preterm      AB  1   Living  1     SAB  1   TAB      Ectopic      Multiple      Live Births  1        Obstetric Comments  LMP 12/25/14        Family History  Problem Relation Age of Onset  . Diabetes Mother   . Hypertension Father   . Diabetes Father   . Asthma Maternal Uncle   . Cancer Maternal Grandmother        breast cancer  . Stomach cancer Paternal Grandfather     Social History   Tobacco Use  . Smoking status: Current Some Day Smoker    Packs/day: 0.20    Years: 7.00    Pack years: 1.40     Types: Cigarettes  . Smokeless tobacco: Never Used  Vaping Use  . Vaping Use: Never used  Substance Use Topics  . Alcohol use: Yes    Alcohol/week: 2.0 standard drinks    Types: 2 Glasses of wine per week    Comment: occasionally  . Drug use: No    Comment: Quit Marijuana 01/2017    Home Medications Prior to Admission medications   Medication Sig Start Date End Date Taking? Authorizing Provider  albuterol (PROVENTIL HFA;VENTOLIN HFA) 108 (90 Base) MCG/ACT inhaler Inhale 2 puffs into the lungs every 6 (six) hours as needed for wheezing or shortness of breath. Patient not taking: Reported on 04/19/2020 12/06/17   Enid Derry, PA-C  famotidine (PEPCID) 20 MG tablet Take 1 tablet (20 mg total) by mouth 2 (two) times daily. Patient not taking: Reported on 04/19/2020 02/14/20   Arby Barrette, MD  ibuprofen (ADVIL) 200 MG tablet Take 800 mg by mouth every 4 (four) hours as needed for headache or moderate pain.     [provider]  medroxyPROGESTERone (DEPO-PROVERA)  150 MG/ML injection Inject 150 mg into the muscle every 3 (three) months.    [provider]  meloxicam (MOBIC) 7.5 MG tablet Take 1 tablet (7.5 mg total) by mouth 2 (two) times daily as needed for up to 14 days for pain. 07/14/20 07/28/20  Eber Hong, MD  naproxen (NAPROSYN) 500 MG tablet Take 1 tablet (500 mg total) by mouth 2 (two) times daily. Patient not taking: Reported on 04/19/2020 02/14/20   Arby Barrette, MD  SUMAtriptan (IMITREX) 25 MG tablet Take 1 tablet (25 mg total) by mouth every 2 (two) hours as needed for migraine. May repeat in 2 hours if headache persists or recurs.  Maximum of 3 tablets/day. 02/14/20   Arby Barrette, MD    Allergies    Kiwi extract, Peach [prunus persica], and Strawberry extract  Review of Systems   Review of Systems  Constitutional: Negative for chills and fever.  HENT: Negative for congestion.   Respiratory: Negative for shortness of breath.   Cardiovascular: Negative for  chest pain.  Gastrointestinal: Negative for abdominal pain.  Genitourinary: Negative for enuresis.  Musculoskeletal: Negative for back pain.  Skin: Negative for rash.  Neurological: Negative for dizziness.  Hematological: Does not bruise/bleed easily.    Physical Exam Updated Vital Signs BP 133/60 (BP Location: Right Arm)   Pulse 60   Temp 98.1 F (36.7 C) (Oral)   Resp 16   SpO2 99%   Physical Exam Vitals and nursing note reviewed.  Constitutional:      General: She is not in acute distress.    Appearance: Normal appearance. She is not ill-appearing or diaphoretic.  HENT:     Head: Normocephalic and atraumatic.     Nose: No congestion or rhinorrhea.  Eyes:     General: No scleral icterus.       Right eye: No discharge.        Left eye: No discharge.     Conjunctiva/sclera: Conjunctivae normal.  Pulmonary:     Effort: Pulmonary effort is normal. No respiratory distress.     Breath sounds: Normal breath sounds. No wheezing.  Musculoskeletal:        General: Signs of injury present. No swelling, tenderness or deformity.     Cervical back: Neck supple.     Right lower leg: No edema.     Left lower leg: No edema.     Comments: Right upper thigh was visualized, a small laceration is visualized, with 7 sutures noted.  It was not erythematous, no drainage or discharge noted,, nontender to palpation,  no fluctuance or indurations felt.  Skin:    General: Skin is warm and dry.     Coloration: Skin is not jaundiced or pale.  Neurological:     Mental Status: She is alert and oriented to person, place, and time.  Psychiatric:        Mood and Affect: Mood normal.     ED Results / Procedures / Treatments   Labs (all labs ordered are listed, but only abnormal results are displayed) Labs Reviewed - No data to display  EKG None  Radiology No results found.  Procedures .Suture Removal  Date/Time: 07/28/2020 11:18 AM Performed by: Carroll Sage, PA-C Authorized by:  Carroll Sage, PA-C   Consent:    Consent obtained:  Verbal   Consent given by:  Patient   Risks discussed:  Bleeding, pain and wound separation   Alternatives discussed:  No treatment Location:    Location:  Lower  extremity   Lower extremity location:  Leg   Leg location:  R upper leg Procedure details:    Wound appearance:  No signs of infection, good wound healing and clean   Number of sutures removed:  7 Post-procedure details:    Post-removal:  No dressing applied   Patient tolerance of procedure:  Tolerated well, no immediate complications   (including critical care time)  Medications Ordered in ED Medications - No data to display  ED Course  I have reviewed the triage vital signs and the nursing notes.  Pertinent labs & imaging results that were available during my care of the patient were reviewed by me and considered in my medical decision making (see chart for details).    MDM Rules/Calculators/A&P                          I have personally reviewed all imaging, labs and have interpreted them.  Patient presents for suture removal.  Patient was alert and oriented, did not appear in acute distress, vital signs reassuring.  On exam patient's right upper thigh was visualized, no signs of infection noted, 7 stitches were visualized.  Will proceed with suture removal.  Sutures were removed without complication, patient tolerated the procedure well.   I have low suspicion for systemic infection as patient is nontoxic-appearing, vital signs reassuring, no obvious infection noted on exam.  Low suspicion for overlying cellulitis, deep tissue infection as there is no erythema noted around the wound, no drainage or discharge noted, no induration or fluctuance felt on exam.  The wound is healing well, will recommend continue wound care and to follow-up with her primary care doctor if needed for further pain management.  Due to well-appearing patient additional lab work and  imaging were not warranted at this time.  Patient appears resting comfortable, vital signs reassuring, no indication for hospital admission.  Patient was given at home care and strict return precautions.  Patient verbalized that she understood and agreed to plan. Final Clinical Impression(s) / ED Diagnoses Final diagnoses:  Visit for suture removal    Rx / DC Orders ED Discharge Orders    None       Carroll Sage, PA-C 07/28/20 1125    Melene Plan, DO 07/28/20 1249

## 2020-07-28 NOTE — Discharge Instructions (Signed)
You are here for suture removal.  I have removed 7 sutures.  Please continue to keep the area clean and dry.  I have given you the information for community health and wellness they can help you find a primary care provider please contact them at your earliest convenience.  I want to come back to emergency department if you develop fever, chills, redness or swelling around the wound, cheeselike discharge coming from the wound chest pain, shortness of breath, severe abdominal pain, nausea, vomiting, diarrhea as these symptoms acquire further evaluation management.

## 2020-10-23 ENCOUNTER — Other Ambulatory Visit: Payer: Self-pay

## 2020-10-23 ENCOUNTER — Ambulatory Visit (LOCAL_COMMUNITY_HEALTH_CENTER): Payer: Medicaid Other | Admitting: Advanced Practice Midwife

## 2020-10-23 ENCOUNTER — Encounter: Payer: Self-pay | Admitting: Advanced Practice Midwife

## 2020-10-23 VITALS — BP 126/88 | Ht 66.0 in | Wt 309.0 lb

## 2020-10-23 DIAGNOSIS — Z30013 Encounter for initial prescription of injectable contraceptive: Secondary | ICD-10-CM | POA: Diagnosis not present

## 2020-10-23 DIAGNOSIS — Z3009 Encounter for other general counseling and advice on contraception: Secondary | ICD-10-CM

## 2020-10-23 DIAGNOSIS — F17200 Nicotine dependence, unspecified, uncomplicated: Secondary | ICD-10-CM | POA: Diagnosis not present

## 2020-10-23 NOTE — Progress Notes (Signed)
Pt received Depo 150mg  IM today per , CNM verbal order that it is ok for pt to restart Depo today, as well as from Arnetha Courser, PA-C order for Depo from 11/22/2019. Pt tolerated well. Reminder card of when next Depo is due given to pt and pt aware to give 01/20/2020 a call around that time so she can RTC for Depo. Pt also aware that physical is due in 11/2020. Provider orders completed.

## 2020-10-23 NOTE — Progress Notes (Signed)
Pt is here for Depo restart. Pt reports is satisfied with the Depo as her BCM, just has some weight gain with it. Pt's last Depo was 04/21/2020, so pt is 26 weeks and 3 days post last Depo.

## 2020-10-23 NOTE — Progress Notes (Signed)
Contraception/Family Planning VISIT ENCOUNTER NOTE  Subjective:   Amy Mcfarland is a 34 y.o.SBF G2P1011 smoker female here for reproductive life counseling.  Desires DMPA  Restart from [facility].  Reports she does not want a pregnancy in the next year. Denies abnormal vaginal bleeding, discharge, pelvic pain, problems with intercourse or other gynecologic concerns.    Gynecologic History No LMP recorded. Patient has had an injection. Contraception: Depo-Provera injections  LMP 04/2020.  Last sex 05/2020 with condom.  Physical due 11/2020.  Last DMPA 04/21/2020 and wants to restart.  Pap due 07/07/2023.  Smoker 1 ppd.  Health Maintenance Due  Topic Date Due  . Hepatitis C Screening  Never done  . COVID-19 Vaccine (1) Never done  . INFLUENZA VACCINE  Never done     The following portions of the patient's history were reviewed and updated as appropriate: allergies, current medications, past family history, past medical history, past social history, past surgical history and problem list.  Review of Systems Pertinent items are noted in HPI.   Objective:  BP 126/88   Ht 5\' 6"  (1.676 m)   Wt (!) 309 lb (140.2 kg)   BMI 49.87 kg/m  Gen: well appearing, NAD HEENT: no scleral icterus CV: RR Lung: Normal WOB Ext: warm well perfused   Assessment and Plan:   Contraception counseling: Reviewed all forms of birth control options in the tiered based approach. available including abstinence; over the counter/barrier methods; hormonal contraceptive medication including pill, patch, ring, injection,contraceptive implant, ECP; hormonal and nonhormonal IUDs; permanent sterilization options including vasectomy and the various tubal sterilization modalities. Risks, benefits, and typical effectiveness rates were reviewed.  Questions were answered.  Written information was also given to the patient to review.  Patient desires DMPA, this was prescribed for patient. She will follow up in 11-13 wks for  surveillance.  She was told to call with any further questions, or with any concerns about this method of contraception.  Emphasized use of condoms 100% of the time for STI prevention.  Patient was offered ECP. ECP was not accepted by the patient. ECP counseling was not given - see RN documentation  1. Family planning Last sex 05/2020 with condom; no current partner LMP 04/2020  2. Encounter for initial prescription of injectable contraceptive May have DMPA 150 mg IM q 11-13 wks until physical due 11/2020 Please counsel pt on need for abstinance/backkup condoms next 7 days  3. Smoker 1 ppd Counseled via 5 A's to stop smoking    Please refer to After Visit Summary for other counseling recommendations.   No follow-ups on file.  12/2020, CNM [facility] DEPARTMENT

## 2020-11-12 ENCOUNTER — Encounter: Payer: Self-pay | Admitting: Emergency Medicine

## 2020-11-12 ENCOUNTER — Emergency Department
Admission: EM | Admit: 2020-11-12 | Discharge: 2020-11-12 | Disposition: A | Discharge status: Home / Self Care | Payer: Medicaid Other | Attending: Emergency Medicine | Admitting: Emergency Medicine

## 2020-11-12 ENCOUNTER — Other Ambulatory Visit: Payer: Self-pay

## 2020-11-12 DIAGNOSIS — R070 Pain in throat: Secondary | ICD-10-CM | POA: Diagnosis present

## 2020-11-12 DIAGNOSIS — F17210 Nicotine dependence, cigarettes, uncomplicated: Secondary | ICD-10-CM | POA: Insufficient documentation

## 2020-11-12 DIAGNOSIS — B349 Viral infection, unspecified: Secondary | ICD-10-CM | POA: Insufficient documentation

## 2020-11-12 DIAGNOSIS — Z20822 Contact with and (suspected) exposure to covid-19: Secondary | ICD-10-CM | POA: Insufficient documentation

## 2020-11-12 LAB — RESP PANEL BY RT-PCR (FLU A&B, COVID) ARPGX2
Influenza A by PCR: NEGATIVE
Influenza B by PCR: NEGATIVE
SARS Coronavirus 2 by RT PCR: NEGATIVE

## 2020-11-12 MED ORDER — ACETAMINOPHEN-CODEINE #3 300-30 MG PO TABS: 1.0000 | ORAL_TABLET | Freq: Four times a day (QID) | ORAL | 0 refills | Status: DC | PRN

## 2020-11-12 MED ORDER — ONDANSETRON 4 MG PO TBDP
4.0000 mg | ORAL_TABLET | Freq: Three times a day (TID) | ORAL | 0 refills | Status: DC | PRN
Start: 2020-11-12 — End: 2020-12-04

## 2020-11-12 NOTE — ED Provider Notes (Signed)
[facility] Emergency Department Provider Note  ____________________________________________  Time seen: Approximately 9:42 AM  I have reviewed the triage vital signs and the nursing notes.   HISTORY  Chief Complaint Headache, Generalized Body Aches, and Sore Throat   HPI Amy Mcfarland is a 34 y.o. female who presents to the emergency department for treatment and evaluation of sore throat, bodyaches and headache for the last few days.  No relief of headache with ibuprofen.   Past Medical History:  Diagnosis Date  . Kidney infection   . Kidney infection   . Migraine   . Migraines   . Miscarriage     Patient Active Problem List   Diagnosis Date Noted  . Smoker 1 ppd 10/23/2020  . Migraine without aura 11/22/2019  . Seasonal allergies 11/22/2019  . 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 Morbid obesity (HCC) BMI=49.8 07/06/2018    Past Surgical History:  Procedure Laterality Date  . CESAREAN SECTION  2011  . DILATION AND CURETTAGE OF UTERUS  2006    Prior to Admission medications   Medication Sig Start Date End Date Taking? Authorizing Provider  acetaminophen-codeine (TYLENOL #3) 300-30 MG tablet Take 1 tablet by mouth every 6 (six) hours as needed for moderate pain. 11/12/20  Yes Brieanna Nau B, FNP  ondansetron (ZOFRAN-ODT) 4 MG disintegrating tablet Take 1 tablet (4 mg total) by mouth every 8 (eight) hours as needed for nausea or vomiting. 11/12/20  Yes Reagan Klemz B, FNP  albuterol (PROVENTIL HFA;VENTOLIN HFA) 108 (90 Base) MCG/ACT inhaler Inhale 2 puffs into the lungs every 6 (six) hours as needed for wheezing or shortness of breath. Patient not taking: No sig reported 12/06/17   Enid Derry, PA-C  famotidine (PEPCID) 20 MG tablet Take 1 tablet (20 mg total) by mouth 2 (two) times daily. Patient not taking: No sig reported 02/14/20   Arby Barrette, MD  ibuprofen (ADVIL) 200 MG tablet Take 800 mg by mouth every 4 (four) hours as  needed for headache or moderate pain.     [provider]  medroxyPROGESTERone (DEPO-PROVERA) 150 MG/ML injection Inject 150 mg into the muscle every 3 (three) months. Patient not taking: Reported on 10/23/2020    [provider]  naproxen (NAPROSYN) 500 MG tablet Take 1 tablet (500 mg total) by mouth 2 (two) times daily. Patient not taking: No sig reported 02/14/20   Arby Barrette, MD  SUMAtriptan (IMITREX) 25 MG tablet Take 1 tablet (25 mg total) by mouth every 2 (two) hours as needed for migraine. May repeat in 2 hours if headache persists or recurs.  Maximum of 3 tablets/day. Patient not taking: Reported on 10/23/2020 02/14/20   Arby Barrette, MD    Allergies Kiwi extract, Peach [prunus persica], and Strawberry extract  Family History  Problem Relation Age of Onset  . Diabetes Mother   . Hypertension Father   . Diabetes Father   . Asthma Maternal Uncle   . Cancer Maternal Grandmother        breast cancer  . Stomach cancer Paternal Grandfather     Social History Social History   Tobacco Use  . Smoking status: Current Every Day Smoker    Packs/day: 1.00    Years: 7.00    Pack years: 7.00    Types: Cigarettes  . Smokeless tobacco: Never Used  Vaping Use  . Vaping Use: Never used  Substance Use Topics  . Alcohol use: Yes    Alcohol/week: 2.0 standard drinks    Types:  2 Glasses of wine per week    Comment: occasionally  . Drug use: No    Comment: Quit Marijuana 01/2017    Review of Systems Constitutional: Negative for fever/chills.  Normal appetite. ENT: Positive for sore throat. Cardiovascular: Denies chest pain. Respiratory: Negative for shortness of breath.  Positive for cough.  Negative for wheezing.  Gastrointestinal: No nausea, no vomiting.  No diarrhea.  Musculoskeletal: Positive for body aches Skin: Negative for rash. Neurological: Negative for headaches ____________________________________________   PHYSICAL EXAM:  VITAL SIGNS: ED  Triage Vitals  Enc Vitals Group     BP 11/12/20 0923 (!) 144/73     Pulse Rate 11/12/20 0923 71     Resp 11/12/20 0923 20     Temp 11/12/20 0923 98.8 F (37.1 C)     Temp Source 11/12/20 0923 Oral     SpO2 11/12/20 0923 100 %     Weight 11/12/20 0907 295 lb (133.8 kg)     Height 11/12/20 0907 5\' 6"  (1.676 m)     Head Circumference --      Peak Flow --      Pain Score 11/12/20 0906 9     Pain Loc --      Pain Edu? --      Excl. in GC? --     Constitutional: Alert and oriented.  Overall well appearing and in no acute distress. Eyes: Conjunctivae are normal. Ears: Exam deferred Nose: No sinus congestion noted; no rhinnorhea. Mouth/Throat: Mucous membranes are moist.  Oropharynx clear. Tonsils not visualized. Uvula midline. Neck: No stridor.  Lymphatic: No cervical lymphadenopathy. Cardiovascular: Normal rate, regular rhythm. Good peripheral circulation. Respiratory: Respirations are even and unlabored.  No retractions.  Breath sounds clear to auscultation. Gastrointestinal: Soft and nontender.  Musculoskeletal: FROM x 4 extremities.  Neurologic:  Normal speech and language. Skin:  Skin is warm, dry and intact. No rash noted. Psychiatric: Mood and affect are normal. Speech and behavior are normal.  ____________________________________________   LABS (all labs ordered are listed, but only abnormal results are displayed)  Labs Reviewed  RESP PANEL BY RT-PCR (FLU A&B, COVID) [id number]   ____________________________________________  EKG   ____________________________________________  RADIOLOGY  Not indicated. ____________________________________________   PROCEDURES  Procedure(s) performed: None  Critical Care performed: No ____________________________________________   INITIAL IMPRESSION / ASSESSMENT AND PLAN / ED COURSE  34 y.o. female presenting to the emergency department for treatment and evaluation of symptoms as described in HPI.  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  She will be given an excuse for work today and tomorrow.  Quarantine instructions were discussed if her Covid results are positive.  She is to return to the emergency department or see primary care for symptoms that change or worsen or for new concerns.    Medications - No data to display  ED Discharge Orders         Ordered    acetaminophen-codeine (TYLENOL #3) 300-30 MG tablet  Every 6 hours PRN        11/12/20 0958    ondansetron (ZOFRAN-ODT) 4 MG disintegrating tablet  Every 8 hours PRN        11/12/20 0958           Pertinent labs & imaging results that were available during my care of the patient were reviewed by me and considered in my medical decision making (see chart for details).    If controlled substance prescribed  during this visit, 12 month history viewed on the [facility] prior to issuing an initial prescription for Schedule II or III opiod. ____________________________________________   FINAL CLINICAL IMPRESSION(S) / ED DIAGNOSES  Final diagnoses:  Close exposure to COVID-19 virus  Acute viral syndrome    Note:  This document was prepared using Dragon voice recognition software and may include unintentional dictation errors.    Chinita Pester, FNP 11/12/20 1104    Jene Every, MD 11/12/20 1112

## 2020-11-12 NOTE — ED Triage Notes (Signed)
Pt reports sore throat, bodyaches  And headache for the last few days. Pt reports works around people who may have covid.

## 2020-11-12 NOTE — Discharge Instructions (Signed)
Follow up with primary care or return to the ER for symptoms of concern.

## 2020-12-04 ENCOUNTER — Other Ambulatory Visit: Payer: Self-pay

## 2020-12-04 ENCOUNTER — Encounter (HOSPITAL_COMMUNITY): Payer: Self-pay

## 2020-12-04 ENCOUNTER — Ambulatory Visit (HOSPITAL_COMMUNITY)
Admission: EM | Admit: 2020-12-04 | Discharge: 2020-12-04 | Disposition: A | Discharge status: Home / Self Care | Payer: Medicaid Other | Attending: Family Medicine | Admitting: Family Medicine

## 2020-12-04 DIAGNOSIS — F17210 Nicotine dependence, cigarettes, uncomplicated: Secondary | ICD-10-CM | POA: Diagnosis not present

## 2020-12-04 DIAGNOSIS — Z6841 Body Mass Index (BMI) 40.0 and over, adult: Secondary | ICD-10-CM | POA: Insufficient documentation

## 2020-12-04 DIAGNOSIS — J4521 Mild intermittent asthma with (acute) exacerbation: Secondary | ICD-10-CM

## 2020-12-04 DIAGNOSIS — G43809 Other migraine, not intractable, without status migrainosus: Secondary | ICD-10-CM | POA: Insufficient documentation

## 2020-12-04 DIAGNOSIS — J069 Acute upper respiratory infection, unspecified: Secondary | ICD-10-CM | POA: Insufficient documentation

## 2020-12-04 DIAGNOSIS — R519 Headache, unspecified: Secondary | ICD-10-CM

## 2020-12-04 DIAGNOSIS — U071 COVID-19: Secondary | ICD-10-CM | POA: Insufficient documentation

## 2020-12-04 LAB — SARS CORONAVIRUS 2 (TAT 6-24 HRS): SARS Coronavirus 2: POSITIVE — AB

## 2020-12-04 MED ORDER — ONDANSETRON 4 MG PO TBDP
4.0000 mg | ORAL_TABLET | Freq: Three times a day (TID) | ORAL | 0 refills | Status: DC | PRN
Start: 2020-12-04 — End: 2021-06-23

## 2020-12-04 MED ORDER — PROMETHAZINE-DM 6.25-15 MG/5ML PO SYRP
5.0000 mL | ORAL_SOLUTION | Freq: Four times a day (QID) | ORAL | 0 refills | Status: DC | PRN
Start: 2020-12-04 — End: 2021-06-23

## 2020-12-04 MED ORDER — ALBUTEROL SULFATE HFA 108 (90 BASE) MCG/ACT IN AERS: 2.0000 | INHALATION_SPRAY | Freq: Four times a day (QID) | RESPIRATORY_TRACT | 0 refills | Status: DC | PRN

## 2020-12-04 MED ORDER — SUMATRIPTAN SUCCINATE 25 MG PO TABS: 25.0000 mg | ORAL_TABLET | ORAL | 0 refills | Status: DC | PRN

## 2020-12-04 MED ORDER — ALBUTEROL SULFATE HFA 108 (90 BASE) MCG/ACT IN AERS
2.0000 | INHALATION_SPRAY | Freq: Four times a day (QID) | RESPIRATORY_TRACT | 0 refills | Status: DC | PRN
Start: 2020-12-04 — End: 2021-06-23

## 2020-12-04 NOTE — ED Provider Notes (Signed)
[facility]    CSN: [id number] Arrival date & time: 12/04/20  0935      History   Chief Complaint Chief Complaint  Patient presents with  . Sore Throat  . Chills  . Generalized Body Aches    HPI Amy Mcfarland is a 35 y.o. female.   Here today with 3 day history of body aches, chills, fever, nausea, sore throat, headache, loss of taste intermittently, fatigue. Denies CP, SOB, vomiting, diarrhea, abdominal pain. Taking ibuprofen and tylenol without much relief. Hx of asthma, needing albuterol refill. Also states hx of migraines and needing imitrex refill. Several sick contacts recently.       Past Medical History:  Diagnosis Date  . Kidney infection   . Kidney infection   . Migraine   . Migraines   . Miscarriage     Patient Active Problem List   Diagnosis Date Noted  . Smoker 1 ppd 10/23/2020  . Migraine without aura 11/22/2019  . Seasonal allergies 11/22/2019  . 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 Morbid obesity (HCC) BMI=49.8 07/06/2018    Past Surgical History:  Procedure Laterality Date  . CESAREAN SECTION  2011  . DILATION AND CURETTAGE OF UTERUS  2006    OB History    Gravida  2   Para  1   Term  1   Preterm      AB  1   Living  1     SAB  1   IAB      Ectopic      Multiple      Live Births  1        Obstetric Comments  LMP 12/25/14         Home Medications    Prior to Admission medications   Medication Sig Start Date End Date Taking? Authorizing Provider  promethazine-dextromethorphan (PROMETHAZINE-DM) 6.25-15 MG/5ML syrup Take 5 mLs by mouth 4 (four) times daily as needed for cough. 12/04/20  Yes Particia Nearing, PA-C  acetaminophen-codeine (TYLENOL #3) 300-30 MG tablet Take 1 tablet by mouth every 6 (six) hours as needed for moderate pain. 11/12/20   Triplett, Rulon Eisenmenger B, FNP  albuterol (VENTOLIN HFA) 108 (90 Base) MCG/ACT inhaler Inhale 2 puffs into the lungs every 6 (six) hours as needed for wheezing  or shortness of breath. 12/04/20   Particia Nearing, PA-C  famotidine (PEPCID) 20 MG tablet Take 1 tablet (20 mg total) by mouth 2 (two) times daily. Patient not taking: No sig reported 02/14/20   Arby Barrette, MD  ibuprofen (ADVIL) 200 MG tablet Take 800 mg by mouth every 4 (four) hours as needed for headache or moderate pain.     [provider]  medroxyPROGESTERone (DEPO-PROVERA) 150 MG/ML injection Inject 150 mg into the muscle every 3 (three) months. Patient not taking: No sig reported    [provider]  naproxen (NAPROSYN) 500 MG tablet Take 1 tablet (500 mg total) by mouth 2 (two) times daily. Patient not taking: No sig reported 02/14/20   Arby Barrette, MD  ondansetron (ZOFRAN-ODT) 4 MG disintegrating tablet Take 1 tablet (4 mg total) by mouth every 8 (eight) hours as needed for nausea or vomiting. 12/04/20   Particia Nearing, PA-C  SUMAtriptan (IMITREX) 25 MG tablet Take 1 tablet (25 mg total) by mouth every 2 (two) hours as needed for migraine. May repeat in 2 hours if headache persists or recurs.  Maximum of 3 tablets/day. 12/04/20   Particia Nearing,  PA-C    Family History Family History  Problem Relation Age of Onset  . Diabetes Mother   . Hypertension Father   . Diabetes Father   . Asthma Maternal Uncle   . Cancer Maternal Grandmother        breast cancer  . Stomach cancer Paternal Grandfather     Social History Social History   Tobacco Use  . Smoking status: Current Every Day Smoker    Packs/day: 1.00    Years: 7.00    Pack years: 7.00    Types: Cigarettes  . Smokeless tobacco: Never Used  Vaping Use  . Vaping Use: Never used  Substance Use Topics  . Alcohol use: Yes    Alcohol/week: 2.0 standard drinks    Types: 2 Glasses of wine per week    Comment: occasionally  . Drug use: No    Comment: Quit Marijuana 01/2017     Allergies   Kiwi extract, Peach [prunus persica], and Strawberry extract   Review of Systems Review  of Systems PER HPI    Physical Exam Triage Vital Signs ED Triage Vitals  Enc Vitals Group     BP 12/04/20 1040 137/69     Pulse Rate 12/04/20 1040 98     Resp 12/04/20 1040 20     Temp 12/04/20 1040 99.7 F (37.6 C)     Temp Source 12/04/20 1040 Oral     SpO2 12/04/20 1040 100 %     Weight 12/04/20 1045 292 lb (132.5 kg)     Height --      Head Circumference --      Peak Flow --      Pain Score 12/04/20 1045 9     Pain Loc --      Pain Edu? --      Excl. in GC? --    No data found.  Updated Vital Signs BP 137/69 (BP Location: Right Arm)   Pulse 98   Temp 99.7 F (37.6 C) (Oral)   Resp 20   Wt 292 lb (132.5 kg)   SpO2 100%   BMI 47.13 kg/m   Visual Acuity Right Eye Distance:   Left Eye Distance:   Bilateral Distance:    Right Eye Near:   Left Eye Near:    Bilateral Near:     Physical Exam Vitals and nursing note reviewed.  Constitutional:      Appearance: Normal appearance. She is not ill-appearing.  HENT:     Head: Atraumatic.     Right Ear: Tympanic membrane normal.     Left Ear: Tympanic membrane normal.     Nose: Rhinorrhea present.     Mouth/Throat:     Mouth: Mucous membranes are moist.     Pharynx: Posterior oropharyngeal erythema present.  Eyes:     Extraocular Movements: Extraocular movements intact.     Conjunctiva/sclera: Conjunctivae normal.  Cardiovascular:     Rate and Rhythm: Normal rate and regular rhythm.     Heart sounds: Normal heart sounds.  Pulmonary:     Effort: Pulmonary effort is normal. No respiratory distress.     Breath sounds: Normal breath sounds. No wheezing or rales.  Abdominal:     General: Bowel sounds are normal. There is no distension.     Palpations: Abdomen is soft.     Tenderness: There is no abdominal tenderness. There is no right CVA tenderness, left CVA tenderness or guarding.  Musculoskeletal:        General: Normal  range of motion.     Cervical back: Normal range of motion and neck supple.  Skin:     General: Skin is warm and dry.  Neurological:     Mental Status: She is alert and oriented to person, place, and time.  Psychiatric:        Mood and Affect: Mood normal.        Thought Content: Thought content normal.        Judgment: Judgment normal.      UC Treatments / Results  Labs (all labs ordered are listed, but only abnormal results are displayed) Labs Reviewed  SARS CORONAVIRUS 2 (TAT 6-24 HRS)    EKG   Radiology No results found.  Procedures Procedures (including critical care time)  Medications Ordered in UC Medications - No data to display  Initial Impression / Assessment and Plan / UC Course  I have reviewed the triage vital signs and the nursing notes.  Pertinent labs & imaging results that were available during my care of the patient were reviewed by me and considered in my medical decision making (see chart for details).     Vitals and exam overall reassuring today, COVID pcr pending, treat with albuterol inhaler, zofran, phenergan DM, and supportive home care. Work note given. Also refilled imitrex for her prn migraine regimen.   Final Clinical Impressions(s) / UC Diagnoses   Final diagnoses:  Viral URI with cough  Mild intermittent asthma with acute exacerbation  Other migraine without status migrainosus, not intractable   Discharge Instructions   None    ED Prescriptions    Medication Sig Dispense Auth. Provider   albuterol (VENTOLIN HFA) 108 (90 Base) MCG/ACT inhaler  (Status: Discontinued) Inhale 2 puffs into the lungs every 6 (six) hours as needed for wheezing or shortness of breath. 1 each Particia Nearing, PA-C   SUMAtriptan (IMITREX) 25 MG tablet Take 1 tablet (25 mg total) by mouth every 2 (two) hours as needed for migraine. May repeat in 2 hours if headache persists or recurs.  Maximum of 3 tablets/day. 20 tablet Particia Nearing, PA-C   ondansetron (ZOFRAN-ODT) 4 MG disintegrating tablet Take 1 tablet (4 mg total) by mouth  every 8 (eight) hours as needed for nausea or vomiting. 20 tablet Particia Nearing, [place]   promethazine-dextromethorphan (PROMETHAZINE-DM) 6.25-15 MG/5ML syrup Take 5 mLs by mouth 4 (four) times daily as needed for cough. 100 mL Particia Nearing, PA-C   albuterol (VENTOLIN HFA) 108 (90 Base) MCG/ACT inhaler Inhale 2 puffs into the lungs every 6 (six) hours as needed for wheezing or shortness of breath. 1 each Particia Nearing, PA-C     PDMP not reviewed this encounter.   Particia Nearing, [place] 12/04/20 1329

## 2020-12-04 NOTE — ED Triage Notes (Signed)
Patient presents to Urgent Care with complaints of generalized body aches, chills, fever (101.7), nausea, sore throat, HA, loss of taste x 3 days. Treating fever Tylenol and ibuprofen 0450 last dose. Pt request refill for Imitrex and albuterol.

## 2020-12-05 ENCOUNTER — Telehealth: Payer: Self-pay | Admitting: Oncology

## 2020-12-05 ENCOUNTER — Encounter: Payer: Self-pay | Admitting: Oncology

## 2020-12-05 NOTE — Telephone Encounter (Signed)
Called to discuss with patient about COVID-19 symptoms and the use of one of the available treatments for those with mild to moderate Covid symptoms and at a high risk of hospitalization.  Pt appears to qualify for outpatient treatment due to co-morbid conditions and/or a member of an at-risk group in accordance with the FDA Emergency Use Authorization.    Symptom onset: 12/02/20 Vaccinated: Yes Booster? No Immunocompromised? no Qualifiers:  Past Medical History:  Diagnosis Date   Kidney infection    Kidney infection    Migraine    Migraines    Miscarriage    Obesity, Asthma  Spoke with the patient and she would like to look over information and get back to [place].  Telephone number provided.  Mauro Kaufmann

## 2021-02-03 ENCOUNTER — Emergency Department: Payer: Medicaid Other

## 2021-02-03 ENCOUNTER — Other Ambulatory Visit: Payer: Self-pay

## 2021-02-03 ENCOUNTER — Emergency Department
Admission: EM | Admit: 2021-02-03 | Discharge: 2021-02-03 | Disposition: A | Discharge status: Home / Self Care | Payer: Medicaid Other | Attending: Emergency Medicine | Admitting: Emergency Medicine

## 2021-02-03 DIAGNOSIS — R52 Pain, unspecified: Secondary | ICD-10-CM

## 2021-02-03 DIAGNOSIS — M79662 Pain in left lower leg: Secondary | ICD-10-CM | POA: Diagnosis not present

## 2021-02-03 DIAGNOSIS — F17210 Nicotine dependence, cigarettes, uncomplicated: Secondary | ICD-10-CM | POA: Insufficient documentation

## 2021-02-03 DIAGNOSIS — M79605 Pain in left leg: Secondary | ICD-10-CM

## 2021-02-03 DIAGNOSIS — R0602 Shortness of breath: Secondary | ICD-10-CM | POA: Insufficient documentation

## 2021-02-03 DIAGNOSIS — M25562 Pain in left knee: Secondary | ICD-10-CM | POA: Diagnosis not present

## 2021-02-03 LAB — CBC WITH DIFFERENTIAL/PLATELET
Abs Immature Granulocytes: 0.04 10*3/uL (ref 0.00–0.07)
Basophils Absolute: 0.1 10*3/uL (ref 0.0–0.1)
Basophils Relative: 0 %
Eosinophils Absolute: 0.2 10*3/uL (ref 0.0–0.5)
Eosinophils Relative: 2 %
HCT: 41 % (ref 36.0–46.0)
Hemoglobin: 13.5 g/dL (ref 12.0–15.0)
Immature Granulocytes: 0 %
Lymphocytes Relative: 21 %
Lymphs Abs: 2.7 10*3/uL (ref 0.7–4.0)
MCH: 29.6 pg (ref 26.0–34.0)
MCHC: 32.9 g/dL (ref 30.0–36.0)
MCV: 89.9 fL (ref 80.0–100.0)
Monocytes Absolute: 1.1 10*3/uL — ABNORMAL HIGH (ref 0.1–1.0)
Monocytes Relative: 9 %
Neutro Abs: 8.7 10*3/uL — ABNORMAL HIGH (ref 1.7–7.7)
Neutrophils Relative %: 68 %
Platelets: 261 10*3/uL (ref 150–400)
RBC: 4.56 MIL/uL (ref 3.87–5.11)
RDW: 13.9 % (ref 11.5–15.5)
WBC: 12.7 10*3/uL — ABNORMAL HIGH (ref 4.0–10.5)
nRBC: 0 % (ref 0.0–0.2)

## 2021-02-03 LAB — COMPREHENSIVE METABOLIC PANEL
ALT: 22 U/L (ref 0–44)
AST: 16 U/L (ref 15–41)
Albumin: 3.8 g/dL (ref 3.5–5.0)
Alkaline Phosphatase: 66 U/L (ref 38–126)
Anion gap: 4 — ABNORMAL LOW (ref 5–15)
BUN: 9 mg/dL (ref 6–20)
CO2: 24 mmol/L (ref 22–32)
Calcium: 8.5 mg/dL — ABNORMAL LOW (ref 8.9–10.3)
Chloride: 107 mmol/L (ref 98–111)
Creatinine, Ser: 0.96 mg/dL (ref 0.44–1.00)
GFR, Estimated: >60 mL/min (ref >60)
Glucose, Bld: 94 mg/dL (ref 70–99)
Potassium: 3.6 mmol/L (ref 3.5–5.1)
Sodium: 135 mmol/L (ref 135–145)
Total Bilirubin: 0.6 mg/dL (ref 0.3–1.2)
Total Protein: 7.8 g/dL (ref 6.5–8.1)

## 2021-02-03 LAB — PROTIME-INR
INR: 1 (ref 0.8–1.2)
Prothrombin Time: 13 seconds (ref 11.4–15.2)

## 2021-02-03 LAB — D-DIMER, QUANTITATIVE: D-Dimer, Quant: 0.41 ug/mL-FEU (ref 0.00–0.50)

## 2021-02-03 LAB — TROPONIN I (HIGH SENSITIVITY): Troponin I (High Sensitivity): 4 ng/L (ref <18)

## 2021-02-03 LAB — APTT: aPTT: 27 seconds (ref 24–36)

## 2021-02-03 MED ORDER — MELOXICAM 15 MG PO TABS: 15.0000 mg | ORAL_TABLET | Freq: Every day | ORAL | 2 refills | Status: DC

## 2021-02-03 NOTE — ED Notes (Signed)
See triage note  Presents with left knee pain w/o injury  States pain radiates into lower leg

## 2021-02-03 NOTE — ED Notes (Signed)
Provided DC instructions. Patient verbalized understanding. A&O and ambulatory with steady gate.

## 2021-02-03 NOTE — Discharge Instructions (Signed)
Apply ice to your knee.  Take the meloxicam as prescribed Return emergency department if you continue to have left calf pain in 1 week she will need an additional ultrasound Follow-up with your regular doctor as needed Try wearing a neoprene sleeve that you can buy at [facility], [employer], or any pharmacy.

## 2021-02-03 NOTE — ED Provider Notes (Signed)
[facility] Emergency Department Provider Note  ____________________________________________   Event Date/Time   First MD Initiated Contact with Patient 02/03/21 1327     (approximate)  I have reviewed the triage vital signs and the nursing notes.   HISTORY  Chief Complaint Ankle Pain    HPI Amy Mcfarland is a 35 y.o. female presents emergency department complaining of knee pain and left lower leg pain.  No known injury.  Patient had COVID a month ago.  Her mother states she has been complaining of the leg pain for couple of weeks.  States when she was massaging and rubbing it that it felt warm to touch.  Patient states she has continued to have some shortness of breath but does not know if it is from lingering Covid or if something else is going on.  Patient is on Depo.  She denies any fever or chills at this time.  No chest pain.    Past Medical History:  Diagnosis Date  . Kidney infection   . Kidney infection   . Migraine   . Migraines   . Miscarriage     Patient Active Problem List   Diagnosis Date Noted  . Smoker 1 ppd 10/23/2020  . Migraine without aura 11/22/2019  . Seasonal allergies 11/22/2019  . 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 Morbid obesity (HCC) BMI=49.8 07/06/2018    Past Surgical History:  Procedure Laterality Date  . CESAREAN SECTION  2011  . DILATION AND CURETTAGE OF UTERUS  2006    Prior to Admission medications   Medication Sig Start Date End Date Taking? Authorizing Provider  meloxicam (MOBIC) 15 MG tablet Take 1 tablet (15 mg total) by mouth daily. 02/03/21 02/03/22 Yes Fisher, Roselyn Bering, PA-C  acetaminophen-codeine (TYLENOL #3) 300-30 MG tablet Take 1 tablet by mouth every 6 (six) hours as needed for moderate pain. 11/12/20   Triplett, Rulon Eisenmenger B, FNP  albuterol (VENTOLIN HFA) 108 (90 Base) MCG/ACT inhaler Inhale 2 puffs into the lungs every 6 (six) hours as needed for wheezing or shortness of breath. 12/04/20   Particia Nearing, PA-C  ibuprofen (ADVIL) 200 MG tablet Take 800 mg by mouth every 4 (four) hours as needed for headache or moderate pain.     [provider]  ondansetron (ZOFRAN-ODT) 4 MG disintegrating tablet Take 1 tablet (4 mg total) by mouth every 8 (eight) hours as needed for nausea or vomiting. 12/04/20   Particia Nearing, PA-C  promethazine-dextromethorphan (PROMETHAZINE-DM) 6.25-15 MG/5ML syrup Take 5 mLs by mouth 4 (four) times daily as needed for cough. 12/04/20   Particia Nearing, PA-C  SUMAtriptan (IMITREX) 25 MG tablet Take 1 tablet (25 mg total) by mouth every 2 (two) hours as needed for migraine. May repeat in 2 hours if headache persists or recurs.  Maximum of 3 tablets/day. 12/04/20   Particia Nearing, PA-C    Allergies Kiwi extract, Peach [prunus persica], and Strawberry extract  Family History  Problem Relation Age of Onset  . Diabetes Mother   . Hypertension Father   . Diabetes Father   . Asthma Maternal Uncle   . Cancer Maternal Grandmother        breast cancer  . Stomach cancer Paternal Grandfather     Social History Social History   Tobacco Use  . Smoking status: Current Every Day Smoker    Packs/day: 1.00    Years: 7.00    Pack years: 7.00    Types: Cigarettes  .  Smokeless tobacco: Never Used  Vaping Use  . Vaping Use: Never used  Substance Use Topics  . Alcohol use: Yes    Alcohol/week: 2.0 standard drinks    Types: 2 Glasses of wine per week    Comment: occasionally  . Drug use: No    Comment: Quit Marijuana 01/2017    Review of Systems  Constitutional: No fever/chills Eyes: No visual changes. ENT: No sore throat. Respiratory: Denies cough Cardiovascular: Denies chest pain Gastrointestinal: Denies abdominal pain Genitourinary: Negative for dysuria. Musculoskeletal: Negative for back pain.  Positive left lower leg pain Skin: Negative for rash. Psychiatric: no mood changes,      ____________________________________________   PHYSICAL EXAM:  VITAL SIGNS: ED Triage Vitals  Enc Vitals Group     BP 02/03/21 1251 139/79     Pulse Rate 02/03/21 1251 77     Resp 02/03/21 1251 19     Temp 02/03/21 1251 98 F (36.7 C)     Temp src --      SpO2 02/03/21 1255 100 %     Weight 02/03/21 1325 291 lb 0.1 oz (132 kg)     Height 02/03/21 1325 5\' 6"  (1.676 m)     Head Circumference --      Peak Flow --      Pain Score 02/03/21 1256 8     Pain Loc --      Pain Edu? --      Excl. in GC? --     Constitutional: Alert and oriented. Well appearing and in no acute distress. Eyes: Conjunctivae are normal.  Head: Atraumatic. Nose: No congestion/rhinnorhea. Mouth/Throat: Mucous membranes are moist.   Neck:  supple no lymphadenopathy noted Cardiovascular: Normal rate, regular rhythm. Heart sounds are normal Respiratory: Normal respiratory effort.  No retractions, lungs c t a  GU: deferred Musculoskeletal: FROM all extremities, warm and well perfused, left knee is tender, left calf is very tender to palpation, positive Homans' sign, neurovascular is intact Neurologic:  Normal speech and language.  Skin:  Skin is warm, dry and intact. No rash noted. Psychiatric: Mood and affect are normal. Speech and behavior are normal.  ____________________________________________   LABS (all labs ordered are listed, but only abnormal results are displayed)  Labs Reviewed  COMPREHENSIVE METABOLIC PANEL - Abnormal; Notable for the following components:      Result Value   Calcium 8.5 (*)    Anion gap 4 (*)    All other components within normal limits  CBC WITH DIFFERENTIAL/PLATELET - Abnormal; Notable for the following components:   WBC 12.7 (*)    Neutro Abs 8.7 (*)    Monocytes Absolute 1.1 (*)    All other components within normal limits  D-DIMER, QUANTITATIVE  PROTIME-INR  APTT  TROPONIN I (HIGH SENSITIVITY)  TROPONIN I (HIGH SENSITIVITY)    ____________________________________________   ____________________________________________  RADIOLOGY  Ultrasound left lower extremity X-ray of the left knee  ____________________________________________   PROCEDURES  Procedure(s) performed: No  Procedures    ____________________________________________   INITIAL IMPRESSION / ASSESSMENT AND PLAN / ED COURSE  Pertinent labs & imaging results that were available during my care of the patient were reviewed by me and considered in my medical decision making (see chart for details).   Patient is a 35 year old female presents with left lower leg pain.  See HPI.  Physical exam shows patient to appear stable.  DDx: DVT, muscle strain, PE, long Covid  CBC has elevated WBC of 12.7, D-dimer is normal,  PT PTT are normal, comprehensive metabolic panel is normal and troponin is normal  X-ray left knee reviewed by me confirmed by radiology to be normal Ultrasound left lower extremity  Ultrasound does not show DVT.  I did explain all findings to the patient.  I do not feel we need to do a CTA for PE as her D-dimer is normal.  She is to follow-up with her regular doctor if not improving in 3 to 4 days.  Return emergency department if worsening.  Also explained to her that if her leg continues to hurt in the calf area in a week she will need a repeat ultrasound.  She was given a prescription for meloxicam.  Encouraged to use a neoprene sleeve for her knee.  Given a work note discharged stable condition.    Amy Mcfarland was evaluated in Emergency Department on 02/03/2021 for the symptoms described in the history of present illness. She was evaluated in the context of the global COVID-19 pandemic, which necessitated consideration that the patient might be at risk for infection with the SARS-CoV-2 virus that causes COVID-19. Institutional protocols and algorithms that pertain to the evaluation of patients at risk for COVID-19 are in a  state of rapid change based on information released by regulatory bodies including the CDC and federal and state organizations. These policies and algorithms were followed during the patient's care in the ED.    As part of my medical decision making, I reviewed the following data within the electronic MEDICAL RECORD NUMBER History obtained from family, Nursing notes reviewed and incorporated, Labs reviewed , Old chart reviewed, Radiograph reviewed , Notes from prior ED visits and [place] Controlled Substance Database  ____________________________________________   FINAL CLINICAL IMPRESSION(S) / ED DIAGNOSES  Final diagnoses:  Left leg pain      NEW MEDICATIONS STARTED DURING THIS VISIT:  Discharge Medication List as of 02/03/2021  4:27 PM    START taking these medications   Details  meloxicam (MOBIC) 15 MG tablet Take 1 tablet (15 mg total) by mouth daily., Starting Tue 02/03/2021, Until Wed 02/03/2022, Normal         Note:  This document was prepared using Dragon voice recognition software and may include unintentional dictation errors.    Faythe Ghee, PA-C 02/03/21 1708    Minna Antis, MD 02/04/21 [phone]

## 2021-02-03 NOTE — ED Triage Notes (Signed)
Pt comes with c/o right ankle pain. Pt denies any recent injuries.

## 2021-02-28 ENCOUNTER — Other Ambulatory Visit: Payer: Self-pay

## 2021-02-28 ENCOUNTER — Emergency Department (HOSPITAL_BASED_OUTPATIENT_CLINIC_OR_DEPARTMENT_OTHER): Payer: Medicaid Other

## 2021-02-28 ENCOUNTER — Encounter (HOSPITAL_COMMUNITY): Payer: Self-pay | Admitting: Emergency Medicine

## 2021-02-28 ENCOUNTER — Emergency Department (HOSPITAL_COMMUNITY)
Admission: EM | Admit: 2021-02-28 | Discharge: 2021-02-28 | Disposition: A | Discharge status: Home / Self Care | Payer: Medicaid Other | Attending: Emergency Medicine | Admitting: Emergency Medicine

## 2021-02-28 DIAGNOSIS — M79605 Pain in left leg: Secondary | ICD-10-CM | POA: Diagnosis not present

## 2021-02-28 DIAGNOSIS — F17210 Nicotine dependence, cigarettes, uncomplicated: Secondary | ICD-10-CM | POA: Insufficient documentation

## 2021-02-28 DIAGNOSIS — D72829 Elevated white blood cell count, unspecified: Secondary | ICD-10-CM | POA: Diagnosis not present

## 2021-02-28 LAB — COMPREHENSIVE METABOLIC PANEL
ALT: 24 U/L (ref 0–44)
AST: 19 U/L (ref 15–41)
Albumin: 3.6 g/dL (ref 3.5–5.0)
Alkaline Phosphatase: 58 U/L (ref 38–126)
Anion gap: 6 (ref 5–15)
BUN: 9 mg/dL (ref 6–20)
CO2: 24 mmol/L (ref 22–32)
Calcium: 8.8 mg/dL — ABNORMAL LOW (ref 8.9–10.3)
Chloride: 105 mmol/L (ref 98–111)
Creatinine, Ser: 0.96 mg/dL (ref 0.44–1.00)
GFR, Estimated: >60 mL/min (ref >60)
Glucose, Bld: 88 mg/dL (ref 70–99)
Potassium: 3.8 mmol/L (ref 3.5–5.1)
Sodium: 135 mmol/L (ref 135–145)
Total Bilirubin: 0.8 mg/dL (ref 0.3–1.2)
Total Protein: 7.4 g/dL (ref 6.5–8.1)

## 2021-02-28 LAB — CBC WITH DIFFERENTIAL/PLATELET
Abs Immature Granulocytes: 0.06 10*3/uL (ref 0.00–0.07)
Basophils Absolute: 0.1 10*3/uL (ref 0.0–0.1)
Basophils Relative: 1 %
Eosinophils Absolute: 0.1 10*3/uL (ref 0.0–0.5)
Eosinophils Relative: 1 %
HCT: 42.5 % (ref 36.0–46.0)
Hemoglobin: 13.9 g/dL (ref 12.0–15.0)
Immature Granulocytes: 1 %
Lymphocytes Relative: 18 %
Lymphs Abs: 2.3 10*3/uL (ref 0.7–4.0)
MCH: 29.9 pg (ref 26.0–34.0)
MCHC: 32.7 g/dL (ref 30.0–36.0)
MCV: 91.4 fL (ref 80.0–100.0)
Monocytes Absolute: 1.1 10*3/uL — ABNORMAL HIGH (ref 0.1–1.0)
Monocytes Relative: 9 %
Neutro Abs: 8.9 10*3/uL — ABNORMAL HIGH (ref 1.7–7.7)
Neutrophils Relative %: 70 %
Platelets: 271 10*3/uL (ref 150–400)
RBC: 4.65 MIL/uL (ref 3.87–5.11)
RDW: 14.3 % (ref 11.5–15.5)
WBC: 12.5 10*3/uL — ABNORMAL HIGH (ref 4.0–10.5)
nRBC: 0 % (ref 0.0–0.2)

## 2021-02-28 LAB — CK: Total CK: 105 U/L (ref 38–234)

## 2021-02-28 MED ORDER — METHOCARBAMOL 500 MG PO TABS: 500.0000 mg | ORAL_TABLET | Freq: Two times a day (BID) | ORAL | 0 refills | Status: DC

## 2021-02-28 MED ORDER — HYDROMORPHONE HCL 1 MG/ML IJ SOLN
0.5000 mg | Freq: Once | INTRAMUSCULAR | Status: AC
  Administered 2021-02-28: 0.5 mg via INTRAVENOUS
  Filled 2021-02-28: qty 1

## 2021-02-28 MED ORDER — OXYCODONE-ACETAMINOPHEN 5-325 MG PO TABS: 1.0000 | ORAL_TABLET | Freq: Three times a day (TID) | ORAL | 0 refills | Status: DC | PRN

## 2021-02-28 NOTE — ED Notes (Signed)
Patient verbalizes understanding of discharge instructions. Opportunity for questioning and answers were provided. Armband removed by staff, pt discharged from ED.  

## 2021-02-28 NOTE — Progress Notes (Signed)
VASCULAR LAB    Left lower extremity venous duplex has been performed.  See CV proc for preliminary results.  Gave verbal report to Farrel Gordon, PA-C  Jermain Curt, RVT 02/28/2021, 1:12 PM

## 2021-02-28 NOTE — ED Provider Notes (Signed)
[facility] EMERGENCY DEPARTMENT Provider Note   CSN: [id number] Arrival date & time: 02/28/21  4765     History Chief Complaint  Patient presents with  . Leg Pain    Amy Mcfarland is a 35 y.o. female with no pertinent past medical history that presents the emerge department for lower leg pain on the left side.  Patient states that she is having pain for the past month from her knee down to her ankle.  States that the pain feels like a spasm, comes and goes.  States that feels like if someone is grabbing on her calf and then will let go.  Denies any trauma to the area.  Denies any previous DVT, coagulation history, recent travel, sedentary lifestyle, pregnancy or recent surgeries.  States that the pain has been manageable to the point where she has been able to work, works as a Therapist, art.  States that however this morning the pain was so severe she cannot stand on her leg.  Has been taking Tylenol without much relief.  Denies any fevers, chills, nausea or vomiting.  Denies any weakness into that leg or paresthesias.  Denies any back injury hip injury or knee injury.  Denies any falls.  Patient states that pain is severe right now, does not radiate anywhere, primarily behind her calf.  HPI     Past Medical History:  Diagnosis Date  . Kidney infection   . Kidney infection   . Migraine   . Migraines   . Miscarriage     Patient Active Problem List   Diagnosis Date Noted  . Smoker 1 ppd 10/23/2020  . Migraine without aura 11/22/2019  . Seasonal allergies 11/22/2019  . 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 Morbid obesity (HCC) BMI=49.8 07/06/2018    Past Surgical History:  Procedure Laterality Date  . CESAREAN SECTION  2011  . DILATION AND CURETTAGE OF UTERUS  2006     OB History    Gravida  2   Para  1   Term  1   Preterm      AB  1   Living  1     SAB  1   IAB      Ectopic      Multiple      Live Births  1        Obstetric  Comments  LMP 12/25/14        Family History  Problem Relation Age of Onset  . Diabetes Mother   . Hypertension Father   . Diabetes Father   . Asthma Maternal Uncle   . Cancer Maternal Grandmother        breast cancer  . Stomach cancer Paternal Grandfather     Social History   Tobacco Use  . Smoking status: Current Every Day Smoker    Packs/day: 1.00    Years: 7.00    Pack years: 7.00    Types: Cigarettes  . Smokeless tobacco: Never Used  Vaping Use  . Vaping Use: Never used  Substance Use Topics  . Alcohol use: Yes    Alcohol/week: 2.0 standard drinks    Types: 2 Glasses of wine per week    Comment: occasionally  . Drug use: No    Comment: Quit Marijuana 01/2017    Home Medications Prior to Admission medications   Medication Sig Start Date End Date Taking? Authorizing Provider  methocarbamol (ROBAXIN) 500 MG tablet Take 1 tablet (500 mg total) by mouth  2 (two) times daily. 02/28/21  Yes Farrel GordonPatel, Chibueze Beasley, PA-C  oxyCODONE-acetaminophen (PERCOCET/ROXICET) 5-325 MG tablet Take 1 tablet by mouth every 8 (eight) hours as needed for severe pain. 02/28/21  Yes Farrel GordonPatel, Vicente Weidler, PA-C  albuterol (VENTOLIN HFA) 108 (90 Base) MCG/ACT inhaler Inhale 2 puffs into the lungs every 6 (six) hours as needed for wheezing or shortness of breath. 12/04/20   Particia NearingLane, Rachel Elizabeth, PA-C  ibuprofen (ADVIL) 200 MG tablet Take 800 mg by mouth every 4 (four) hours as needed for headache or moderate pain.     [provider]  meloxicam (MOBIC) 15 MG tablet Take 1 tablet (15 mg total) by mouth daily. 02/03/21 02/03/22  Sherrie MustacheFisher, Roselyn BeringSusan W, PA-C  ondansetron (ZOFRAN-ODT) 4 MG disintegrating tablet Take 1 tablet (4 mg total) by mouth every 8 (eight) hours as needed for nausea or vomiting. 12/04/20   Particia NearingLane, Rachel Elizabeth, PA-C  promethazine-dextromethorphan (PROMETHAZINE-DM) 6.25-15 MG/5ML syrup Take 5 mLs by mouth 4 (four) times daily as needed for cough. 12/04/20   Particia NearingLane, Rachel Elizabeth, PA-C  SUMAtriptan  (IMITREX) 25 MG tablet Take 1 tablet (25 mg total) by mouth every 2 (two) hours as needed for migraine. May repeat in 2 hours if headache persists or recurs.  Maximum of 3 tablets/day. 12/04/20   Particia NearingLane, Rachel Elizabeth, PA-C    Allergies    Kiwi extract, Peach [prunus persica], and Strawberry extract  Review of Systems   Review of Systems  Constitutional: Negative for diaphoresis, fatigue and fever.  Eyes: Negative for visual disturbance.  Respiratory: Negative for shortness of breath.   Cardiovascular: Negative for chest pain.  Gastrointestinal: Negative for nausea and vomiting.  Musculoskeletal: Positive for arthralgias and myalgias. Negative for back pain.  Skin: Negative for color change, pallor, rash and wound.  Neurological: Negative for syncope, weakness, light-headedness, numbness and headaches.  Psychiatric/Behavioral: Negative for behavioral problems and confusion.    Physical Exam Updated Vital Signs BP (!) 135/117   Pulse 91   Temp (!) 97.3 F (36.3 C)   Resp 20   Ht 5\' 6"  (1.676 m)   Wt 135.2 kg   SpO2 100%   BMI 48.10 kg/m   Physical Exam Constitutional:      General: She is not in acute distress.    Appearance: Normal appearance. She is not ill-appearing, toxic-appearing or diaphoretic.     Comments: Patient is extremely tearful, not ill-appearing.  Cardiovascular:     Rate and Rhythm: Normal rate and regular rhythm.     Pulses: Normal pulses.  Pulmonary:     Effort: Pulmonary effort is normal.     Breath sounds: Normal breath sounds.  Musculoskeletal:        General: Normal range of motion.       Legs:     Comments: Patient with tenderness to palpation of left calf anteriorly and posteriorly.  No tenderness to palpation of knee or ankle.  Normal range of motion to ankle and knees normal leg raise.  Compartments are soft.  PT pulses 2+.  No edema or erythema.  Skin:    General: Skin is warm and dry.     Capillary Refill: Capillary refill takes less than  2 seconds.  Neurological:     General: No focal deficit present.     Mental Status: She is alert and oriented to person, place, and time.  Psychiatric:        Mood and Affect: Mood normal.        Behavior: Behavior normal.  Thought Content: Thought content normal.     ED Results / Procedures / Treatments   Labs (all labs ordered are listed, but only abnormal results are displayed) Labs Reviewed  CBC WITH DIFFERENTIAL/PLATELET - Abnormal; Notable for the following components:      Result Value   WBC 12.5 (*)    Neutro Abs 8.9 (*)    Monocytes Absolute 1.1 (*)    All other components within normal limits  COMPREHENSIVE METABOLIC PANEL - Abnormal; Notable for the following components:   Calcium 8.8 (*)    All other components within normal limits  CK    EKG None  Radiology VAS [place] LOWER EXTREMITY VENOUS (DVT) ([facility] and [facility] 7a-7p)  Result Date: 02/28/2021  Lower Venous DVT Study Indications: Calf pain X 1 month.  Limitations: Body habitus and patient would not remove pants, would only pull them up. Comparison Study: Prior negative left lower extremity venous duplex done                   02/03/21 Performing Technologist: Sherren Kerns RVS  Examination Guidelines: A complete evaluation includes B-mode imaging, spectral Doppler, color Doppler, and power Doppler as needed of all accessible portions of each vessel. Bilateral testing is considered an integral part of a complete examination. Limited examinations for reoccurring indications may be performed as noted. The reflux portion of the exam is performed with the patient in reverse Trendelenburg.  Right Technical Findings: Right leg not evaluated.  +---------+---------------+---------+-----------+----------+-------------------+ LEFT     CompressibilityPhasicitySpontaneityPropertiesThrombus Aging      +---------+---------------+---------+-----------+----------+-------------------+ CFV                                                    Not well visualized +---------+---------------+---------+-----------+----------+-------------------+ SFJ                                                   Not well visualized +---------+---------------+---------+-----------+----------+-------------------+ FV Prox  Full           Yes      Yes                                      +---------+---------------+---------+-----------+----------+-------------------+ FV Mid   Full           Yes      Yes                                      +---------+---------------+---------+-----------+----------+-------------------+ FV DistalFull           Yes      Yes                                      +---------+---------------+---------+-----------+----------+-------------------+ PFV  Not well visualized +---------+---------------+---------+-----------+----------+-------------------+ POP                     Yes      Yes                  patent by color and                                                       Doppler             +---------+---------------+---------+-----------+----------+-------------------+ PTV                                                   patent by color     +---------+---------------+---------+-----------+----------+-------------------+ PERO                                                  Not well visualized +---------+---------------+---------+-----------+----------+-------------------+     Summary: LEFT: - There is no evidence of deep vein thrombosis in the lower extremity. However, portions of this examination were limited- see technologist comments above.  *See table(s) above for measurements and observations.    Preliminary     Procedures Procedures   Medications Ordered in ED Medications  HYDROmorphone (DILAUDID) injection 0.5 mg (0.5 mg Intravenous Given 02/28/21 1115)    ED Course  I have reviewed the triage vital signs  and the nursing notes.  Pertinent labs & imaging results that were available during my care of the patient were reviewed by me and considered in my medical decision making (see chart for details).    MDM Rules/Calculators/A&P                         SUMMER MCCOLGAN is a 35 y.o. female with no pertinent past medical history that presents the emerge department for lower leg pain on the left side for the past month.  Patient is distally neurovascularly intact, differential to include muscle spasms, DVT.  Will check potassium and electrolyte levels, DVT study.  No concerns for cellulitis or compartment syndrome.  Work-up today unremarkable, normal gait.  Think patient was likely having muscle spasms.  Patient does have a small leukocytosis, most likely reactive due to pain.  Patient states that she wants some pain medication that strong so she can go to bed at night, is asking for opioids.  We will give a 3 pills of Percocet to help her until she is able to see orthopedics.  We will place referral for orthopedics at this time and we will also give muscle relaxant.  Patient with normal gait, to be discharged.  Doubt need for further emergent work up at this time. I explained the diagnosis and have given explicit precautions to return to the ER including for any other new or worsening symptoms. The patient understands and accepts the medical plan as it's been dictated and I have answered their questions. Discharge instructions concerning home care and prescriptions have been given. The patient is STABLE and is discharged  to home in good condition.     Final Clinical Impression(s) / ED Diagnoses Final diagnoses:  Left leg pain    Rx / DC Orders ED Discharge Orders         Ordered    methocarbamol (ROBAXIN) 500 MG tablet  2 times daily        02/28/21 1425    oxyCODONE-acetaminophen (PERCOCET/ROXICET) 5-325 MG tablet  Every 8 hours PRN        02/28/21 1425           Farrel Gordon,  PA-C 02/28/21 1627    Tilden Fossa, MD 02/28/21 2026

## 2021-02-28 NOTE — Discharge Instructions (Addendum)
Please follow up with orthopedics.  Take the muscle relaxant as directed.  Take Tylenol as directed on the bottle for pain, when the pain is severe at night you can take the Percocet. Use the attached instructions.    Contact a health care provider if: Your leg cramps get more severe or more frequent, or they do not improve over time. Your foot becomes cold, numb, or blue.

## 2021-03-24 ENCOUNTER — Encounter: Payer: Self-pay | Admitting: Advanced Practice Midwife

## 2021-03-24 ENCOUNTER — Ambulatory Visit: Payer: Medicaid Other

## 2021-03-24 ENCOUNTER — Ambulatory Visit (LOCAL_COMMUNITY_HEALTH_CENTER): Payer: Medicaid Other | Admitting: Advanced Practice Midwife

## 2021-03-24 ENCOUNTER — Other Ambulatory Visit: Payer: Self-pay

## 2021-03-24 VITALS — BP 126/79 | Ht 66.0 in | Wt 313.0 lb

## 2021-03-24 DIAGNOSIS — Z3009 Encounter for other general counseling and advice on contraception: Secondary | ICD-10-CM

## 2021-03-24 DIAGNOSIS — Z30013 Encounter for initial prescription of injectable contraceptive: Secondary | ICD-10-CM

## 2021-03-24 DIAGNOSIS — U071 COVID-19: Secondary | ICD-10-CM

## 2021-03-24 MED ORDER — MEDROXYPROGESTERONE ACETATE 150 MG/ML IM SUSP
150.0000 mg | Freq: Once | INTRAMUSCULAR | Status: AC
  Administered 2021-03-24: 150 mg via INTRAMUSCULAR

## 2021-03-24 NOTE — Progress Notes (Signed)
Here today for Depo only. Last PE and Pap Smear here was 07/06/2018. Last Depo was 10/23/2020 (21.5 weeks.) Declines condoms today. Tawny Hopping, RN

## 2021-03-24 NOTE — Progress Notes (Signed)
DMPA 150 mg IM given per Arnetha Courser CNM order.  Pt counseled to abstain from sex for 1 week or at least use back up method like condoms for a minimum of 1 week with depo restart. Provider orders completed.

## 2021-03-24 NOTE — Progress Notes (Addendum)
Contraception/Family Planning VISIT ENCOUNTER NOTE  Subjective:   Amy Mcfarland is a 35 y.o. SBF [id number](37 yo son) smoker female here for reproductive life counseling.  Desires DMPA for BCM.  Reports she does not want a pregnancy in the next year. Denies abnormal vaginal bleeding, discharge, pelvic pain, problems with intercourse or other gynecologic concerns. Last phsical 07/06/18. Last DMPA 10/23/20. Last pap 07/06/18 neg HPV neg. LMP 03/22/21. Last sex mid December with condom; not with that partner anymore but was with him since 2019. Smoking 3-6 cpd. Denies vaping, cigars. Last MJ 2020. Last ETOH 03/20/21 (2 glasses wine). Employed 20 hours/wk. To begin school on 04/27/21 at [employer]. Living with her mom and son.   Gynecologic History Patient's last menstrual period was 03/22/2021 (exact date). Contraception: none  Health Maintenance Due  Topic Date Due  . COVID-19 Vaccine (1) Never done  . Hepatitis C Screening  Never done     The following portions of the patient's history were reviewed and updated as appropriate: allergies, current medications, past family history, past medical history, past social history, past surgical history and problem list.  Review of Systems Pertinent items are noted in HPI.   Objective:  BP 126/79   Ht 5\' 6"  (1.676 m)   Wt (!) 313 lb (142 kg)   LMP 03/22/2021 (Exact Date)   BMI 50.52 kg/m  Gen: well appearing, NAD HEENT: no scleral icterus CV: RR Lung: Normal WOB Ext: warm well perfused   Assessment and Plan:   Contraception counseling: Reviewed all forms of birth control options in the tiered based approach. available including abstinence; over the counter/barrier methods; hormonal contraceptive medication including pill, patch, ring, injection,contraceptive implant, ECP; hormonal and nonhormonal IUDs; permanent sterilization options including vasectomy and the various tubal sterilization modalities. Risks, benefits, and typical  effectiveness rates were reviewed.  Questions were answered.  Written information was also given to the patient to review.  Patient desires DMPA, this was prescribed for patient. She will follow up in  11-13 wks for surveillance.  She was told to call with any further questions, or with any concerns about this method of contraception.  Emphasized use of condoms 100% of the time for STI prevention.  Patient was not offered ECP. ECP was not accepted by the patient. ECP counseling was not given - see RN documentation  1. Family planning   2. Encounter for initial prescription of injectable contraceptive May have DMPA 150 mg IM x1 today Please counsel on need for physical before next DMPA given Please counsel on need for abstinance/back up condoms next 7 days - medroxyPROGESTERone (DEPO-PROVERA) injection 150 mg    Please refer to After Visit Summary for other counseling recommendations.   No follow-ups on file.  June, CNM [facility] DEPARTMENT

## 2021-06-15 ENCOUNTER — Ambulatory Visit: Payer: Medicaid Other

## 2021-06-20 ENCOUNTER — Other Ambulatory Visit: Payer: Self-pay

## 2021-06-20 ENCOUNTER — Emergency Department (HOSPITAL_COMMUNITY): Payer: Medicaid Other

## 2021-06-20 ENCOUNTER — Emergency Department (HOSPITAL_COMMUNITY)
Admission: EM | Admit: 2021-06-20 | Discharge: 2021-06-21 | Disposition: A | Discharge status: Home / Self Care | Payer: Medicaid Other | Attending: Emergency Medicine | Admitting: Emergency Medicine

## 2021-06-20 ENCOUNTER — Encounter (HOSPITAL_COMMUNITY): Payer: Self-pay | Admitting: Emergency Medicine

## 2021-06-20 DIAGNOSIS — Z5321 Procedure and treatment not carried out due to patient leaving prior to being seen by health care provider: Secondary | ICD-10-CM | POA: Insufficient documentation

## 2021-06-20 DIAGNOSIS — M542 Cervicalgia: Secondary | ICD-10-CM | POA: Insufficient documentation

## 2021-06-20 DIAGNOSIS — M25511 Pain in right shoulder: Secondary | ICD-10-CM | POA: Insufficient documentation

## 2021-06-20 NOTE — ED Triage Notes (Signed)
Pt reports R shoulder and neck pain x 2 weeks.  Denies injury.

## 2021-06-20 NOTE — ED Provider Notes (Signed)
Emergency Medicine Provider Triage Evaluation Note  Amy Mcfarland , a 35 y.o. female  was evaluated in triage.  Pt complains of right shoulder pain.  She is been taking Motrin without relief.  Pain is located along the trapezius and right neck.  No Struve trauma.  Review of Systems  Positive: Right shoulder/trapezius pain Negative: Limited range of motion or trauma  Physical Exam  BP 117/79 (BP Location: Left Arm)   Pulse 94   Temp 98.8 F (37.1 C) (Oral)   Resp 18   SpO2 100%  Gen:   Awake, no distress   Resp:  Normal effort  MSK:   Moves extremities without difficulty  Other:  TTP R trap  Medical Decision Making  Medically screening exam initiated at 6:04 PM.  Appropriate orders placed.  Amy Mcfarland was informed that the remainder of the evaluation will be completed by another provider, this initial triage assessment does not replace that evaluation, and the importance of remaining in the ED until their evaluation is complete.  Shoulder xray ordered.   Arthor Captain, PA-C 06/20/21 1805    Ernie Avena, MD 06/20/21 [phone]

## 2021-06-20 NOTE — ED Notes (Signed)
Patient did not want to wait any longer, and was advised to stay. Pt still decided to leave.

## 2021-06-23 ENCOUNTER — Other Ambulatory Visit: Payer: Self-pay

## 2021-06-23 ENCOUNTER — Encounter: Payer: Self-pay | Admitting: Emergency Medicine

## 2021-06-23 ENCOUNTER — Emergency Department
Admission: EM | Admit: 2021-06-23 | Discharge: 2021-06-23 | Disposition: A | Discharge status: Home / Self Care | Payer: Medicaid Other | Attending: Emergency Medicine | Admitting: Emergency Medicine

## 2021-06-23 DIAGNOSIS — M546 Pain in thoracic spine: Secondary | ICD-10-CM | POA: Insufficient documentation

## 2021-06-23 DIAGNOSIS — F17210 Nicotine dependence, cigarettes, uncomplicated: Secondary | ICD-10-CM | POA: Insufficient documentation

## 2021-06-23 DIAGNOSIS — X58XXXA Exposure to other specified factors, initial encounter: Secondary | ICD-10-CM | POA: Diagnosis not present

## 2021-06-23 DIAGNOSIS — S4991XA Unspecified injury of right shoulder and upper arm, initial encounter: Secondary | ICD-10-CM | POA: Diagnosis present

## 2021-06-23 DIAGNOSIS — S46911A Strain of unspecified muscle, fascia and tendon at shoulder and upper arm level, right arm, initial encounter: Secondary | ICD-10-CM | POA: Insufficient documentation

## 2021-06-23 DIAGNOSIS — Z8616 Personal history of COVID-19: Secondary | ICD-10-CM | POA: Insufficient documentation

## 2021-06-23 DIAGNOSIS — M542 Cervicalgia: Secondary | ICD-10-CM | POA: Diagnosis not present

## 2021-06-23 MED ORDER — LIDOCAINE 5 % EX PTCH: 1.0000 | MEDICATED_PATCH | Freq: Two times a day (BID) | CUTANEOUS | 0 refills | Status: AC | PRN

## 2021-06-23 MED ORDER — CYCLOBENZAPRINE HCL 5 MG PO TABS: 5.0000 mg | ORAL_TABLET | Freq: Three times a day (TID) | ORAL | 0 refills | Status: DC | PRN

## 2021-06-23 MED ORDER — LIDOCAINE 5 % EX PTCH
1.0000 | MEDICATED_PATCH | Freq: Once | CUTANEOUS | Status: DC
  Administered 2021-06-23: 1 via TRANSDERMAL
  Filled 2021-06-23: qty 1

## 2021-06-23 MED ORDER — NABUMETONE 750 MG PO TABS: 750.0000 mg | ORAL_TABLET | Freq: Two times a day (BID) | ORAL | 0 refills | Status: AC

## 2021-06-23 MED ORDER — NAPROXEN 500 MG PO TABS
500.0000 mg | ORAL_TABLET | Freq: Once | ORAL | Status: AC
  Administered 2021-06-23: 500 mg via ORAL
  Filled 2021-06-23: qty 1

## 2021-06-23 NOTE — ED Provider Notes (Signed)
[facility] Emergency Department Provider Note ____________________________________________  Time seen: 39  I have reviewed the triage vital signs and the nursing notes.  HISTORY  Chief Complaint  Shoulder Pain   HPI Amy Mcfarland is a 35 y.o. female presents to the ED for ongoing right shoulder and upper back pain.  Patient apparently presented to a local ED last night, but left before being evaluated.  X-rays were performed at that time.  She denies any preceding injury, trauma, or fall.  She also denies any distal paresthesias, grip changes, or chest pain.  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 Authorizing Provider  cyclobenzaprine (FLEXERIL) 5 MG tablet Take 1 tablet (5 mg total) by mouth 3 (three) times daily as needed. 06/23/21  Yes Rainbow Salman, Charlesetta Ivory, PA-C  lidocaine (LIDODERM) 5 % Place 1 patch onto the skin every 12 (twelve) hours as needed for up to 10 days. Remove & Discard patch after 12 hours of wear each day. 06/23/21 07/03/21 Yes Rosemary Pentecost, Charlesetta Ivory, PA-C  nabumetone (RELAFEN) 750 MG tablet Take 1 tablet (750 mg total) by mouth 2 (two) times daily for 15 days. 06/23/21 07/08/21 Yes Maks Cavallero, Charlesetta Ivory, PA-C  SUMAtriptan (IMITREX) 25 MG tablet Take 1 tablet (25 mg total) by mouth every 2 (two) hours as needed for migraine. May repeat in 2 hours if headache persists or  recurs.  Maximum of 3 tablets/day. 12/04/20   Particia Nearing, PA-C    Allergies Kiwi extract, Peach [prunus persica], and Strawberry extract  Family History  Problem Relation Age of Onset   Diabetes Mother    Hypertension Father    Diabetes Father    Asthma Maternal Uncle    Cancer Maternal Grandmother        breast cancer   Stomach cancer Paternal Grandfather     Social History Social History   Tobacco Use   Smoking status: Every Day    Packs/day: 1.00    Years: 7.00    Pack years: 7.00    Types: Cigarettes   Smokeless tobacco: Never  Vaping Use   Vaping Use: Never used  Substance Use Topics   Alcohol use: Yes    Alcohol/week: 2.0 standard drinks    Types: 2 Glasses of wine per week    Comment: last use 03/20/21   Drug use: Not Currently    Types: Marijuana    Comment: last use 2020    Review of Systems  Constitutional: Negative for fever. Eyes: Negative for visual changes. ENT: Negative for sore throat. Cardiovascular: Negative for chest pain. Respiratory: Negative for shortness of breath. Gastrointestinal: Negative for abdominal pain, vomiting and diarrhea. Genitourinary: Negative for dysuria. Musculoskeletal: Positive for upper back pain. Skin: Negative for rash. Neurological: Negative for headaches, focal weakness or numbness. ____________________________________________  PHYSICAL EXAM:  VITAL SIGNS: ED Triage Vitals  Enc Vitals Group     BP 06/23/21 0823 (!) 145/88     Pulse Rate 06/23/21 0823 78     Resp 06/23/21 0823 16     Temp 06/23/21 0823 98.3 F (36.8 C)     Temp Source 06/23/21 0823 Oral     SpO2 --      Weight 06/23/21 0809 (!) 313 lb 0.9 oz (142 kg)     Height 06/23/21 0809 5\' 6"  (1.676 m)     Head Circumference --      Peak Flow --      Pain Score 06/23/21 0808 10     Pain Loc --      Pain Edu? --      Excl. in GC? --     Constitutional: Alert and oriented. Well appearing and in no distress. Head: Normocephalic and  atraumatic. Eyes: Conjunctivae are normal. Normal extraocular movements Neck: Supple.  Normal range of motion without crepitus.  No midline tenderness is appreciated.  Tender to palpation to the right upper trapezius musculature. Cardiovascular: Normal rate, regular rhythm. Normal distal pulses. Respiratory: Normal respiratory effort. No wheezes/rales/rhonchi. Musculoskeletal: Right shoulder without obvious deformity or dislocation.  Patient with full active range of motion and rotator cuff testing.  No signs of internal derangement on exam.  Normal composite fist distally.  Nontender with normal range of motion in all extremities.  Neurologic: Cranial nerves II to XII grossly intact.  Normal gait without ataxia. Normal speech and language. No gross focal neurologic deficits are appreciated. Skin:  Skin is warm, dry and intact. No rash noted. Psychiatric: Mood and affect are normal. Patient exhibits appropriate insight and judgment. ____________________________________________    {LABS (pertinent positives/negatives)  ____________________________________________  {EKG  ____________________________________________   RADIOLOGY Official radiology report(s): No results found. ____________________________________________  PROCEDURES  Naproxen 500 mg PO Lidoderm Patch 5% topically  Procedures ____________________________________________   INITIAL IMPRESSION / ASSESSMENT AND PLAN / ED COURSE  As part of my medical decision making, I reviewed the following data within the electronic MEDICAL RECORD NUMBER Notes from prior ED visits     DDX: shoulder strain, radiculopathy, myospasms  Patient ED evaluation of pain to the right shoulder, presenting for evaluation.  Examination reveals some pain across the upper trapezius musculature.  No signs of rotator cuff deficit internal derangement of the shoulder.  Patient exam is overall benign and reassuring at this time as it shows no acute neuro  muscular deficit.  Patient be treated for myalgias to the right trapezius region with anti-inflammatories, muscle relaxants, and topical pain patches.  She will follow-up with primary provider return to the ED if needed.   Amy Mcfarland was evaluated in Emergency Department on 06/23/2021 for the symptoms described in the history of present illness. She was evaluated in the context of the global COVID-19 pandemic, which necessitated consideration that the patient might be at risk for infection with the SARS-CoV-2 virus that causes COVID-19. Institutional protocols and algorithms that pertain to the evaluation of patients at risk for COVID-19 are in a state of rapid change based on information released by regulatory bodies including the CDC and federal and state organizations. These policies and algorithms were followed during the patient's care in the ED. ____________________________________________  FINAL CLINICAL IMPRESSION(S) / ED DIAGNOSES  Final diagnoses:  Strain of right shoulder, initial encounter  Musculoskeletal neck pain      Akram Kissick, 08/23/2021, PA-C 06/23/21 1549    Isaacs,  Sheria Lang, MD 06/24/21 8841

## 2021-06-23 NOTE — ED Notes (Signed)
See triage note  Presents with right shoulder pain for about 2 weeks   States pain radiates from her neck into posterior shoulder  Denies any injury  Area is tender on palpation

## 2021-06-23 NOTE — ED Triage Notes (Signed)
C/O right shoulder, neck, and back pain x 2 weeks

## 2021-06-23 NOTE — Discharge Instructions (Addendum)
Your exam and XR are reassuring. You have symptoms of a muscle strain. Take the prescription meds as directed. Follow-up with [facility] or return to the ED if needed.

## 2021-09-06 ENCOUNTER — Emergency Department
Admission: EM | Admit: 2021-09-06 | Discharge: 2021-09-06 | Disposition: A | Discharge status: Home / Self Care | Payer: Medicaid Other | Attending: Emergency Medicine | Admitting: Emergency Medicine

## 2021-09-06 ENCOUNTER — Other Ambulatory Visit: Payer: Self-pay

## 2021-09-06 DIAGNOSIS — Z20822 Contact with and (suspected) exposure to covid-19: Secondary | ICD-10-CM | POA: Diagnosis not present

## 2021-09-06 DIAGNOSIS — B349 Viral infection, unspecified: Secondary | ICD-10-CM | POA: Insufficient documentation

## 2021-09-06 DIAGNOSIS — J029 Acute pharyngitis, unspecified: Secondary | ICD-10-CM | POA: Insufficient documentation

## 2021-09-06 DIAGNOSIS — R509 Fever, unspecified: Secondary | ICD-10-CM | POA: Diagnosis present

## 2021-09-06 LAB — URINALYSIS, COMPLETE (UACMP) WITH MICROSCOPIC
Bilirubin Urine: NEGATIVE
Glucose, UA: NEGATIVE mg/dL
Hgb urine dipstick: NEGATIVE
Ketones, ur: NEGATIVE mg/dL
Nitrite: NEGATIVE
Protein, ur: NEGATIVE mg/dL
Specific Gravity, Urine: 1.015 (ref 1.005–1.030)
pH: 5 (ref 5.0–8.0)

## 2021-09-06 LAB — RESP PANEL BY RT-PCR (FLU A&B, COVID) ARPGX2
Influenza A by PCR: NEGATIVE
Influenza B by PCR: NEGATIVE
SARS Coronavirus 2 by RT PCR: NEGATIVE

## 2021-09-06 LAB — GROUP A STREP BY PCR: Group A Strep by PCR: NOT DETECTED

## 2021-09-06 LAB — POC URINE PREG, ED: Preg Test, Ur: NEGATIVE

## 2021-09-06 MED ORDER — ACETAMINOPHEN 325 MG PO TABS: 650.0000 mg | ORAL_TABLET | Freq: Once | ORAL | Status: DC

## 2021-09-06 NOTE — ED Notes (Signed)
URINE POC NEGATIVE 

## 2021-09-06 NOTE — ED Provider Notes (Signed)
[facility] Emergency Department Provider Note  ____________________________________________   Event Date/Time   First MD Initiated Contact with Patient 09/06/21 [phone]     (approximate)  I have reviewed the triage vital signs and the nursing notes.   HISTORY  Chief Complaint Fever, Chills, Sore Throat, and Generalized Body Aches    HPI Amy Mcfarland is a 35 y.o. female presents emergency department complaining of fever, chills, body aches and sore throat for 3 days.  Took a home COVID test that was negative.  Patient states she feels like things do not taste right but she can still taste.  No vomiting or diarrhea.  Does have some urinary hesitancy but no dysuria.  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 Authorizing Provider  cyclobenzaprine (FLEXERIL) 5 MG tablet Take 1 tablet (5 mg total) by mouth 3 (three) times daily as needed. 06/23/21   Menshew, Charlesetta Ivory, PA-C  SUMAtriptan (IMITREX) 25 MG tablet Take 1 tablet (25 mg total) by mouth every 2 (two) hours as needed for migraine. May repeat in 2 hours if headache persists or recurs.  Maximum of 3 tablets/day. 12/04/20   Particia Nearing, PA-C    Allergies Kiwi extract, Peach [prunus persica], and Strawberry extract  Family History  Problem Relation Age of Onset   Diabetes Mother    Hypertension  Father    Diabetes Father    Asthma Maternal Uncle    Cancer Maternal Grandmother        breast cancer   Stomach cancer Paternal Grandfather     Social History Social History   Tobacco Use   Smoking status: Every Day    Packs/day: 1.00    Years: 7.00    Pack years: 7.00    Types: Cigarettes   Smokeless tobacco: Never  Vaping Use   Vaping Use: Never used  Substance Use Topics   Alcohol use: Yes    Alcohol/week: 2.0 standard drinks    Types: 2 Glasses of wine per week    Comment: last use 03/20/21   Drug use: Not Currently    Types: Marijuana    Comment: last use 2020    Review of Systems  Constitutional: Positive fever/chills Eyes: No visual changes. ENT: Positive sore throat. Respiratory: Denies cough Cardiovascular: Denies chest pain Gastrointestinal: Denies abdominal pain Genitourinary: Negative for dysuria. Musculoskeletal: Negative for back pain. Skin: Negative for rash. Psychiatric: no mood changes,     ____________________________________________   PHYSICAL EXAM:  VITAL SIGNS: ED Triage Vitals  Enc Vitals Group     BP 09/06/21 0842 (!) 156/102     Pulse Rate 09/06/21 0842 92     Resp 09/06/21 0842 17     Temp 09/06/21 0842 98.7 F (37.1 C)     Temp Source 09/06/21 8841  Oral     SpO2 09/06/21 0842 100 %     Weight 09/06/21 0839 290 lb (131.5 kg)     Height 09/06/21 0839 5\' 6"  (1.676 m)     Head Circumference --      Peak Flow --      Pain Score 09/06/21 0839 7     Pain Loc --      Pain Edu? --      Excl. in GC? --     Constitutional: Alert and oriented. Well appearing and in no acute distress. Eyes: Conjunctivae are normal.  Head: Atraumatic. Nose: No congestion/rhinnorhea. Mouth/Throat: Mucous membranes are moist.  Tonsils are red and swollen Neck:  supple no lymphadenopathy noted Cardiovascular: Normal rate, regular rhythm. Heart sounds are normal Respiratory: Normal respiratory effort.  No retractions, lungs c t a  Abd: soft  nontender bs normal all 4 quad GU: deferred Musculoskeletal: FROM all extremities, warm and well perfused Neurologic:  Normal speech and language.  Skin:  Skin is warm, dry and intact. No rash noted. Psychiatric: Mood and affect are normal. Speech and behavior are normal.  ____________________________________________   LABS (all labs ordered are listed, but only abnormal results are displayed)  Labs Reviewed  URINALYSIS, COMPLETE (UACMP) WITH MICROSCOPIC - Abnormal; Notable for the following components:      Result Value   Color, Urine YELLOW (*)    APPearance HAZY (*)    Leukocytes,Ua TRACE (*)    Bacteria, UA RARE (*)    All other components within normal limits  RESP PANEL BY RT-PCR (FLU A&B, COVID) ARPGX2  GROUP A STREP BY PCR  POC URINE PREG, ED   ____________________________________________   ____________________________________________  RADIOLOGY    ____________________________________________   PROCEDURES  Procedure(s) performed: No  Procedures    ____________________________________________   INITIAL IMPRESSION / ASSESSMENT AND PLAN / ED COURSE  Pertinent labs & imaging results that were available during my care of the patient were reviewed by me and considered in my medical decision making (see chart for details).   The patient is a 35 year old female presents with URI symptoms.  See HPI.  Physical exam shows patient per stable  COVID swab, strep test, UA ordered  Labs are reassuring, COVID/flu test are negative, strep test negative, and UA is negative, POC pregnancy is negative  I did explain the findings to the patient.  I do feel that she has a viral illness.  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  She was given a work note.  Encouraged to take over-the-counter medications.  Discharged stable condition.  Amy Mcfarland was evaluated in Emergency Department on 09/06/2021  for the symptoms described in the history of present illness. She was evaluated in the context of the global COVID-19 pandemic, which necessitated consideration that the patient might be at risk for infection with the SARS-CoV-2 virus that causes COVID-19. Institutional protocols and algorithms that pertain to the evaluation of patients at risk for COVID-19 are in a state of rapid change based on information released by regulatory bodies including the CDC and federal and state organizations. These policies and algorithms were followed during the patient's care in the ED.    As part of my medical decision making, I reviewed the following data within the electronic MEDICAL RECORD NUMBER Nursing notes reviewed and incorporated, Labs reviewed , Old chart reviewed, Notes from prior ED visits, and [place] Controlled Substance  Database  ____________________________________________   FINAL CLINICAL IMPRESSION(S) / ED DIAGNOSES  Final diagnoses:  Viral syndrome      NEW MEDICATIONS STARTED DURING THIS VISIT:  Discharge Medication List as of 09/06/2021 10:01 AM       Note:  This document was prepared using Dragon voice recognition software and may include unintentional dictation errors.    Faythe Ghee, PA-C 09/06/21 1126    Minna Antis, MD 09/06/21 1327

## 2021-09-06 NOTE — ED Notes (Addendum)
Urine NEG

## 2021-09-06 NOTE — ED Triage Notes (Signed)
Pt reports 3 days ago she started with fever, chills, bodyaches and sore throat. Pt reports took a covid swab and it was negative.

## 2021-10-27 ENCOUNTER — Encounter (HOSPITAL_COMMUNITY): Payer: Self-pay | Admitting: Emergency Medicine

## 2021-10-27 ENCOUNTER — Other Ambulatory Visit: Payer: Self-pay

## 2021-10-27 ENCOUNTER — Ambulatory Visit (HOSPITAL_COMMUNITY)
Admission: EM | Admit: 2021-10-27 | Discharge: 2021-10-27 | Disposition: A | Discharge status: Home / Self Care | Payer: Medicaid Other | Attending: Family Medicine | Admitting: Family Medicine

## 2021-10-27 DIAGNOSIS — J020 Streptococcal pharyngitis: Secondary | ICD-10-CM

## 2021-10-27 LAB — POCT RAPID STREP A, ED / UC: Streptococcus, Group A Screen (Direct): POSITIVE — AB

## 2021-10-27 MED ORDER — AMOXICILLIN 875 MG PO TABS: 875.0000 mg | ORAL_TABLET | Freq: Two times a day (BID) | ORAL | 0 refills | Status: AC

## 2021-10-27 NOTE — ED Provider Notes (Signed)
[facility]   [id number] 10/27/21 Arrival Time: [id number]  ASSESSMENT & PLAN:  1. Strep pharyngitis    No signs of peritonsillar abscess. Discussed. Begin: Meds ordered this encounter  Medications   amoxicillin (AMOXIL) 875 MG tablet    Sig: Take 1 tablet (875 mg total) by mouth 2 (two) times daily for 10 days.    Dispense:  20 tablet    Refill:  0   Work note provided.  Labs Reviewed  POCT RAPID STREP A, ED / UC - Abnormal; Notable for the following components:      Result Value   Streptococcus, Group A Screen (Direct) POSITIVE (*)    All other components within normal limits    OTC analgesics and throat care as needed  Instructed to finish full 10 day course of antibiotics. Will follow up if not showing significant improvement over the next 24-48 hours.  Reviewed expectations re: course of current medical issues. Questions answered. Outlined signs and symptoms indicating need for more acute intervention. Patient verbalized understanding. After Visit Summary given.   SUBJECTIVE:  Amy Mcfarland is a 35 y.o. female who reports a sore throat. Describes as sharp pain with swallowing. Onset gradual beginning  2-3 d ago . Symptoms have gradually worsened since beginning; without voice changes. No respiratory symptoms. Normal PO intake but reports discomfort with swallowing. No specific alleviating factors. Fever: believed to be present, temp not taken. No neck pain or swelling. No associated nausea, vomiting, or abdominal pain. Known sick contacts: none. Recent travel: none. OTC treatment: none reported.   OBJECTIVE:  Vitals:   10/27/21 1145  BP: 111/73  Pulse: 87  Resp: 17  Temp: 98.3 F (36.8 C)  TempSrc: Oral  SpO2: 97%     General appearance: alert; no distress HEENT: throat with moderate erythema and tonsil enlargement; uvula is midline Neck: supple with FROM; small bilat cervical LAD Lungs: speaks full sentences without difficulty;  unlabored Abd: soft; non-tender Skin: reveals no rash; warm and dry Psychological: alert and cooperative; normal mood and affect  Allergies  Allergen Reactions   Kiwi Extract Other (See Comments)    Makes mouth feel hairy   Peach [Prunus Persica] Other (See Comments)    Makes mouth feel hairy   Strawberry Extract Other (See Comments)    Makes mouth feel like it has hair in it, tongue sweeling    Past Medical History:  Diagnosis Date   Kidney infection    Kidney infection    Migraine    Migraines    Miscarriage    Social History   Socioeconomic History   Marital status: Single    Spouse name: Not on file   Number of children: Not on file   Years of education: Not on file   Highest education level: Not on file  Occupational History   Not on file  Tobacco Use   Smoking status: Every Day    Packs/day: 1.00    Years: 7.00    Pack years: 7.00    Types: Cigarettes   Smokeless tobacco: Never  Vaping Use   Vaping Use: Never used  Substance and Sexual Activity   Alcohol use: Yes    Alcohol/week: 2.0 standard drinks    Types: 2 Glasses of wine per week    Comment: last use 03/20/21   Drug use: Not Currently    Types: Marijuana    Comment: last use 2020   Sexual activity: Yes    Partners: Male  Birth control/protection: Injection, Condom, None  Other Topics Concern   Not on file  Social History Narrative   Not on file   Social Determinants of Health   Financial Resource Strain: Not on file  Food Insecurity: Not on file  Transportation Needs: Not on file  Physical Activity: Not on file  Stress: Not on file  Social Connections: Not on file  Intimate Partner Violence: Not on file   Family History  Problem Relation Age of Onset   Diabetes Mother    Hypertension Father    Diabetes Father    Asthma Maternal Uncle    Cancer Maternal Grandmother        breast cancer   Stomach cancer Paternal Glynda Jaeger, MD 10/27/21 1323

## 2021-10-27 NOTE — ED Triage Notes (Signed)
Pt is present today with nasal congestion, sore throat, loss of taste/small, chills, and  hot sweats. Pt states sx started x3 days ago.

## 2022-03-12 ENCOUNTER — Encounter (HOSPITAL_COMMUNITY): Payer: Self-pay | Admitting: Emergency Medicine

## 2022-03-12 ENCOUNTER — Other Ambulatory Visit: Payer: Self-pay

## 2022-03-12 ENCOUNTER — Emergency Department (HOSPITAL_COMMUNITY)
Admission: EM | Admit: 2022-03-12 | Discharge: 2022-03-12 | Disposition: A | Discharge status: Home / Self Care | Payer: Medicaid Other | Attending: Emergency Medicine | Admitting: Emergency Medicine

## 2022-03-12 DIAGNOSIS — X500XXA Overexertion from strenuous movement or load, initial encounter: Secondary | ICD-10-CM | POA: Insufficient documentation

## 2022-03-12 DIAGNOSIS — R202 Paresthesia of skin: Secondary | ICD-10-CM | POA: Insufficient documentation

## 2022-03-12 DIAGNOSIS — M79602 Pain in left arm: Secondary | ICD-10-CM | POA: Diagnosis present

## 2022-03-12 DIAGNOSIS — Y990 Civilian activity done for income or pay: Secondary | ICD-10-CM | POA: Diagnosis not present

## 2022-03-12 DIAGNOSIS — R200 Anesthesia of skin: Secondary | ICD-10-CM | POA: Insufficient documentation

## 2022-03-12 MED ORDER — CYCLOBENZAPRINE HCL 10 MG PO TABS: 10.0000 mg | ORAL_TABLET | Freq: Two times a day (BID) | ORAL | 0 refills | Status: DC | PRN

## 2022-03-12 MED ORDER — PREDNISONE 10 MG PO TABS: 20.0000 mg | ORAL_TABLET | Freq: Every day | ORAL | 0 refills | Status: AC

## 2022-03-12 NOTE — ED Notes (Signed)
Entire lt arm pain with numbness and tingling x 2 days. Denies any injury to same. All PMS present at this time ?

## 2022-03-12 NOTE — Discharge Instructions (Signed)
You have been seen here for left arm, I recommend taking over-the-counter pain medications like ibuprofen and/or Tylenol every 6 as needed.  Please follow dosage and on the back of bottle.  I also recommend applying heat to the area and stretching out the muscles as this will help decrease stiffness and pain. ? ?Please follow-up with community health and wellness ? ?Come back to the emergency department if you develop chest pain, shortness of breath, severe abdominal pain, uncontrolled nausea, vomiting, diarrhea. ? ? ?

## 2022-03-12 NOTE — ED Triage Notes (Signed)
Pt c/o left arm pain from fingers up arm to back x 2 days  ?

## 2022-03-12 NOTE — ED Provider Notes (Signed)
?[facility] ?Provider Note ? ? ?CSN: [id number] ?Arrival date & time: 03/12/22  0208 ? ?  ? ?History ? ?Chief Complaint  ?Patient presents with  ? Arm Pain  ? ? ?Amy Mcfarland is a 36 y.o. female. ? ?HPI ? ?Patient without medical history presents with complaints of left arm pain.  Patient's pain started about 2 days ago, came on suddenly, states she feels some numbness and tingling moving down her arm, with some slight pain, she denies any headaches change in vision paresthesias or weakness in the other 3 extremities no recent trauma, she does note that she lifts heavy objects for work it is unclear if she might of strained something.  She states she is taking some ibuprofen not much relief, she is never had this in the past.  Pain is worsened with movement improved with rest, she denies any significant neck or back injury, denies fevers chills chest pain or peripheral edema. ? ? ? ?Home Medications ?Prior to Admission medications   ?Medication Sig Start Date End Date Taking? Authorizing Provider  ?cyclobenzaprine (FLEXERIL) 10 MG tablet Take 1 tablet (10 mg total) by mouth 2 (two) times daily as needed for muscle spasms. 03/12/22  Yes Carroll SageFaulkner, Delmus Warwick J, PA-C  ?predniSONE (DELTASONE) 10 MG tablet Take 2 tablets (20 mg total) by mouth daily for 5 days. 03/12/22 03/17/22 Yes Carroll SageFaulkner, Jaxxen Voong J, PA-C  ?SUMAtriptan (IMITREX) 25 MG tablet Take 1 tablet (25 mg total) by mouth every 2 (two) hours as needed for migraine. May repeat in 2 hours if headache persists or recurs.  Maximum of 3 tablets/day. 12/04/20   Particia NearingLane, Rachel Elizabeth, PA-C  ?   ? ?Allergies    ?Kiwi extract, Peach [prunus persica], and Strawberry extract   ? ?Review of Systems   ?Review of Systems  ?Constitutional:  Negative for chills and fever.  ?Respiratory:  Negative for shortness of breath.   ?Cardiovascular:  Negative for chest pain.  ?Gastrointestinal:  Negative for abdominal pain.  ?Musculoskeletal:   ?      Left arm pain  ?Neurological:  Negative for headaches.  ? ?Physical Exam ?Updated Vital Signs ?BP 115/63 (BP Location: Left Arm)   Pulse 70   Temp 98.5 ?F (36.9 ?C) (Oral)   Resp 16   Ht 5\' 6"  (1.676 m)   Wt 131.5 kg   LMP 01/26/2022   SpO2 97%   BMI 46.81 kg/m?  ?Physical Exam ?Vitals and nursing note reviewed.  ?Constitutional:   ?   General: She is not in acute distress. ?   Appearance: She is not ill-appearing.  ?HENT:  ?   Head: Normocephalic and atraumatic.  ?   Nose: No congestion.  ?Eyes:  ?   Conjunctiva/sclera: Conjunctivae normal.  ?Cardiovascular:  ?   Rate and Rhythm: Normal rate and regular rhythm.  ?   Pulses: Normal pulses.  ?   Heart sounds: No murmur heard. ?  No friction rub. No gallop.  ?Pulmonary:  ?   Effort: No respiratory distress.  ?   Breath sounds: No wheezing, rhonchi or rales.  ?Musculoskeletal:  ?   Comments: Spine was visualized no overlying skin changes, spine was palpated was nontender to palpation no step-off deformities noted.  But she was notably tender within the left upper trapezius muscle, which reproduce her pain rating down to her arm.  She has full range of motion 5-5 strength neurovascular tact the upper extremities 2+ radial pulses.  No unilateral weakness present.  ?  Skin: ?   General: Skin is warm and dry.  ?Neurological:  ?   Mental Status: She is alert.  ?Psychiatric:     ?   Mood and Affect: Mood normal.  ? ? ?ED Results / Procedures / Treatments   ?Labs ?(all labs ordered are listed, but only abnormal results are displayed) ?Labs Reviewed - No data to display ? ?EKG ?None ? ?Radiology ?No results found. ? ?Procedures ?Procedures  ? ? ?Medications Ordered in ED ?Medications - No data to display ? ?ED Course/ Medical Decision Making/ A&P ?  ?                        ?Medical Decision Making ? ?This patient presents to the ED for concern of left arm pain, this involves an extensive number of treatment options, and is a complaint that carries with it a high risk of  complications and morbidity.  The differential diagnosis includes fracture, dislocation, compartment syndrome, spinal cord abnormality ? ? ? ?Additional history obtained: ? ?Additional history obtained from N/A ?External records from outside source obtained and reviewed including previous imaging ? ? ?Co morbidities that complicate the patient evaluation ? ?N/A ? ?Social Determinants of Health: ? ?No primary care provider-will provide PCP upon discharge ? ? ? ?Lab Tests: ? ?I Ordered, and personally interpreted labs.  The pertinent results include: N/A ? ? ?Imaging Studies ordered: ? ?I ordered imaging studies including N/A ?I independently visualized and interpreted imaging which showed N/A ?I agree with the radiologist interpretation ? ? ?Cardiac Monitoring: ? ?The patient was maintained on a cardiac monitor.  I personally viewed and interpreted the cardiac monitored which showed an underlying rhythm of: N/A ? ? ?Medicines ordered and prescription drug management: ? ?I ordered medication including N/A ?I have reviewed the patients home medicines and have made adjustments as needed ? ?Critical Interventions: ? ?N/A ? ? ?Reevaluation: ? ?Presents with arm pain, benign physical exam, will provide with pain medication discharge she is agreement this plan. ? ?Consultations Obtained: ? ?N/A ? ? ?Test Considered: ? ?CT imaging of the cervical spine but will defer as she has no deficits or weakness on my exam no traumatic injury associate with this pain unlikely for fracture dislocation. ? ? ? ?Rule out ?Low suspicion for CVA as there is no focal deficit on my exam patient atypical of etiology.  I have low suspicion for spinal cord abnormality as she has no weakness of the upper extremities, neurovascular is fully intact. low Suspicion for fracture dislocation no traumatic injury associate this no deformities noted. ? ? ? ?Dispostion and problem list ? ?After consideration of the diagnostic results and the patients  response to treatment, I feel that the patent would benefit from discharge. ? ?Left arm pain-likely muscular in nature, will provide with muscle relaxers, steroids, follow-up PCP for further evaluation and strict return precautions. ? ? ? ? ? ? ? ? ? ? ? ?Final Clinical Impression(s) / ED Diagnoses ?Final diagnoses:  ?Left arm pain  ? ? ?Rx / DC Orders ?ED Discharge Orders   ? ?      Ordered  ?  predniSONE (DELTASONE) 10 MG tablet  Daily       ? 03/12/22 0357  ?  cyclobenzaprine (FLEXERIL) 10 MG tablet  2 times daily PRN       ? 03/12/22 0357  ? ?  ?  ? ?  ? ? ?  ?Carroll Sage, PA-C ?  03/12/22 0401 ? ?  ?Cathren Laine, MD ?03/12/22 [phone] ? ?

## 2022-03-12 NOTE — ED Notes (Signed)
Provider at bedside at this time

## 2022-05-19 ENCOUNTER — Ambulatory Visit (HOSPITAL_COMMUNITY)
Admission: EM | Admit: 2022-05-19 | Discharge: 2022-05-19 | Disposition: A | Discharge status: Home / Self Care | Payer: Medicaid Other | Attending: Emergency Medicine | Admitting: Emergency Medicine

## 2022-05-19 ENCOUNTER — Encounter (HOSPITAL_COMMUNITY): Payer: Self-pay | Admitting: Emergency Medicine

## 2022-05-19 DIAGNOSIS — Z20822 Contact with and (suspected) exposure to covid-19: Secondary | ICD-10-CM | POA: Insufficient documentation

## 2022-05-19 DIAGNOSIS — R059 Cough, unspecified: Secondary | ICD-10-CM | POA: Diagnosis present

## 2022-05-19 DIAGNOSIS — J069 Acute upper respiratory infection, unspecified: Secondary | ICD-10-CM | POA: Diagnosis not present

## 2022-05-19 DIAGNOSIS — R110 Nausea: Secondary | ICD-10-CM | POA: Insufficient documentation

## 2022-05-19 DIAGNOSIS — R1013 Epigastric pain: Secondary | ICD-10-CM | POA: Diagnosis not present

## 2022-05-19 DIAGNOSIS — J029 Acute pharyngitis, unspecified: Secondary | ICD-10-CM | POA: Insufficient documentation

## 2022-05-19 LAB — POC INFLUENZA A AND B ANTIGEN (URGENT CARE ONLY)
INFLUENZA A ANTIGEN, POC: NEGATIVE
INFLUENZA B ANTIGEN, POC: NEGATIVE

## 2022-05-19 LAB — POCT RAPID STREP A, ED / UC: Streptococcus, Group A Screen (Direct): NEGATIVE

## 2022-05-19 MED ORDER — FAMOTIDINE 20 MG PO TABS: 20.0000 mg | ORAL_TABLET | Freq: Two times a day (BID) | ORAL | 0 refills | Status: DC

## 2022-05-19 MED ORDER — ONDANSETRON HCL 4 MG PO TABS: 4.0000 mg | ORAL_TABLET | Freq: Three times a day (TID) | ORAL | 0 refills | Status: DC | PRN

## 2022-05-19 NOTE — Discharge Instructions (Signed)
You will get a call if test is positive, you will not get a call if test is negative but you can check results in MyChart if you have a MyChart account.   For nasal congestion, use plain Mucinex.  Saline nasal spray or nasal drops can help and can safely be used as often as needed for congestion.    For your sore or scratchy throat, use a saltwater gargle-  to  teaspoon of salt dissolved in a 4-ounce to 8-ounce glass of warm water.  Gargle the solution for approximately 15-30 seconds and then spit.  It is important not to swallow the solution.  You can also use throat lozenges/cough drops and Chloraseptic spray to help with throat pain or discomfort.  Warm or cold liquids can also be helpful in relieving throat pain.  For headache, pain or general discomfort, you can use Ibuprofen or Tylenol as directed.    Taking a steamy shower or using a humidifier may help nasal congestion and ease sore throat pain. You can place a towel over your head and breathe in the steam from hot water   You can try to get an appointment with someone at [facility] on the [email or web address] website

## 2022-05-19 NOTE — ED Triage Notes (Signed)
Pt is present today with c/o nausea, vomiting, HA,sore throat, cough, and nasal congestion. Pt sx started one week ago

## 2022-05-19 NOTE — ED Provider Notes (Signed)
[facility]    CSN: [id number] Arrival date & time: 05/19/22  1641      History   Chief Complaint Chief Complaint  Patient presents with   Emesis   Nasal Congestion   Cough   Sore Throat    HPI Amy Mcfarland is a 36 y.o. female.  Patient reports feeling ill since 05/16/2022.  Reports sore throat, nasal congestion, body aches, chills.  Has not taken her temperature. Mild dry cough.  Reports thinks she may have reflux as she has had a burning pain in her upper chest after eating food associated with dry heaves but no vomiting.  Sore throat is most bothersome but body aches are also bothering her; she thinks she may have influenza.  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   Emesis Associated symptoms: chills, cough, myalgias and sore throat   Associated symptoms: no abdominal pain   Cough Associated symptoms: chills, myalgias and sore throat   Associated symptoms: no shortness of breath and no wheezing   Sore Throat Pertinent negatives include no abdominal pain and no shortness of breath.    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 Authorizing Provider  famotidine (PEPCID) 20 MG tablet Take 1 tablet (20 mg total) by mouth 2 (two) times daily. 05/19/22  Yes Cathlyn Parsons, NP  ondansetron (ZOFRAN) 4 MG tablet Take 1 tablet (4 mg total) by mouth every 8 (eight) hours as needed for nausea or vomiting. 05/19/22  Yes Cathlyn Parsons, NP  cyclobenzaprine (FLEXERIL) 10 MG tablet Take 1 tablet (10 mg total) by mouth 2 (two) times daily as needed for muscle spasms. 03/12/22   Carroll Sage, PA-C  SUMAtriptan (IMITREX) 25 MG tablet Take 1 tablet (25 mg total) by mouth every 2 (two) hours as needed for migraine. May repeat in 2 hours if headache persists or recurs.  Maximum of 3 tablets/day. 12/04/20   Particia Nearing, PA-C    Family History Family History  Problem Relation Age of Onset   Diabetes Mother    Hypertension Father    Diabetes Father    Asthma Maternal Uncle    Cancer Maternal Grandmother        breast cancer   Stomach cancer  Paternal Grandfather     Social History Social History   Tobacco Use   Smoking status: Every Day    Packs/day: 1.00    Years: 7.00    Total pack years: 7.00    Types: Cigarettes   Smokeless tobacco: Never  Vaping Use   Vaping Use: Never used  Substance Use Topics   Alcohol use: Yes    Alcohol/week: 2.0 standard drinks of alcohol    Types: 2 Glasses of wine per week    Comment: last use 03/20/21   Drug use: Not Currently    Types: Marijuana    Comment: last use 2020     Allergies   Kiwi extract, Peach [prunus persica], and Strawberry extract   Review of Systems Review of Systems  Constitutional:  Positive for chills.  HENT:  Positive for congestion and sore throat.   Respiratory:  Positive for cough. Negative for shortness of breath and wheezing.   Gastrointestinal:  Positive for vomiting. Negative for abdominal pain.  Musculoskeletal:  Positive for myalgias.     Physical Exam Triage Vital Signs ED Triage Vitals [05/19/22 1717]  Enc Vitals Group      BP (!) 144/92     Pulse Rate 83     Resp 18     Temp 98.6 F (37 C)     Temp src      SpO2 98 %     Weight      Height      Head Circumference      Peak Flow      Pain Score      Pain Loc      Pain Edu?      Excl. in [place]?    No data found.  Updated Vital Signs BP (!) 144/92   Pulse 83   Temp 98.6 F (37 C)   Resp 18   SpO2 98%   Visual Acuity Right Eye Distance:   Left Eye Distance:   Bilateral Distance:    Right Eye Near:   Left Eye Near:    Bilateral Near:     Physical Exam Constitutional:      General: She is not in acute distress.    Appearance: She is well-developed. She is ill-appearing.  HENT:     Right Ear: Tympanic membrane and ear canal normal.     Left Ear: Tympanic membrane and ear canal normal.     Nose: Congestion present.     Mouth/Throat:     Mouth: Mucous membranes are moist.     Pharynx: Oropharynx is clear.     Tonsils: No tonsillar exudate or tonsillar abscesses.  Cardiovascular:     Rate and Rhythm: Normal rate and regular rhythm.  Pulmonary:     Effort: Pulmonary effort is normal.     Breath sounds: Normal breath sounds.  Abdominal:     General: Bowel sounds are normal. There is no distension.     Palpations: Abdomen is soft.     Tenderness: There is no abdominal tenderness. There is no guarding.  Neurological:     Mental Status: She is alert.      UC Treatments / Results  Labs (all labs ordered are listed, but only abnormal results are displayed) Labs Reviewed  SARS CORONAVIRUS 2 (TAT 6-24 HRS)  POCT RAPID STREP A, ED / UC  POC INFLUENZA A AND B ANTIGEN (URGENT CARE ONLY)    EKG   Radiology No results found.  Procedures Procedures (including critical care  time)  Medications Ordered in UC Medications - No data to display  Initial Impression / Assessment and Plan / UC Course  I have reviewed the triage vital signs and the nursing notes.  Pertinent labs & imaging results that were available during my care of the  patient were reviewed by me and considered in my medical decision making (see chart for details).    Influenza and strep tests are negative.  COVID results pending. Pt may have GERD or sx may be related to present illness; rx famotidine and zofran. Discussed pt finding PCP - review open scheduling at [facility] with pt.   REviewed supportive care for URI symptoms.   Final Clinical Impressions(s) / UC Diagnoses   Final diagnoses:  Viral upper respiratory tract infection  Dyspepsia  Nausea without vomiting     Discharge Instructions      You will get a call if test is positive, you will not get a call if test is negative but you can check results in MyChart if you have a MyChart account.   For nasal congestion, use plain Mucinex.  Saline nasal spray or nasal drops can help and can safely be used as often as needed for congestion.    For your sore or scratchy throat, use a saltwater gargle-  to  teaspoon of salt dissolved in a 4-ounce to 8-ounce glass of warm water.  Gargle the solution for approximately 15-30 seconds and then spit.  It is important not to swallow the solution.  You can also use throat lozenges/cough drops and Chloraseptic spray to help with throat pain or discomfort.  Warm or cold liquids can also be helpful in relieving throat pain.  For headache, pain or general discomfort, you can use Ibuprofen or Tylenol as directed.    Taking a steamy shower or using a humidifier may help nasal congestion and ease sore throat pain. You can place a towel over your head and breathe in the steam from hot water   You can try to get an appointment with someone at [facility] on the [email or web address] website     ED Prescriptions     Medication Sig Dispense Auth. Provider   famotidine (PEPCID) 20 MG tablet Take 1 tablet (20 mg total) by mouth 2 (two) times daily. 30 tablet Cathlyn Parsons, NP   ondansetron (ZOFRAN) 4 MG tablet Take 1 tablet (4 mg total) by mouth every 8 (eight) hours as  needed for nausea or vomiting. 20 tablet Cathlyn Parsons, NP      PDMP not reviewed this encounter.   Cathlyn Parsons, NP 05/19/22 [phone]

## 2022-05-20 LAB — SARS CORONAVIRUS 2 (TAT 6-24 HRS): SARS Coronavirus 2: NEGATIVE

## 2022-08-13 ENCOUNTER — Emergency Department
Admission: EM | Admit: 2022-08-13 | Discharge: 2022-08-13 | Disposition: A | Discharge status: Home / Self Care | Payer: Medicaid Other | Attending: Emergency Medicine | Admitting: Emergency Medicine

## 2022-08-13 ENCOUNTER — Other Ambulatory Visit: Payer: Self-pay

## 2022-08-13 ENCOUNTER — Emergency Department: Payer: Medicaid Other

## 2022-08-13 DIAGNOSIS — M25512 Pain in left shoulder: Secondary | ICD-10-CM | POA: Diagnosis not present

## 2022-08-13 DIAGNOSIS — R079 Chest pain, unspecified: Secondary | ICD-10-CM | POA: Insufficient documentation

## 2022-08-13 DIAGNOSIS — M542 Cervicalgia: Secondary | ICD-10-CM | POA: Diagnosis not present

## 2022-08-13 DIAGNOSIS — M549 Dorsalgia, unspecified: Secondary | ICD-10-CM | POA: Insufficient documentation

## 2022-08-13 DIAGNOSIS — Z5321 Procedure and treatment not carried out due to patient leaving prior to being seen by health care provider: Secondary | ICD-10-CM | POA: Diagnosis not present

## 2022-08-13 LAB — CBC
HCT: 41.2 % (ref 36.0–46.0)
Hemoglobin: 13.3 g/dL (ref 12.0–15.0)
MCH: 29.3 pg (ref 26.0–34.0)
MCHC: 32.3 g/dL (ref 30.0–36.0)
MCV: 90.7 fL (ref 80.0–100.0)
Platelets: 288 10*3/uL (ref 150–400)
RBC: 4.54 MIL/uL (ref 3.87–5.11)
RDW: 13.8 % (ref 11.5–15.5)
WBC: 12.2 10*3/uL — ABNORMAL HIGH (ref 4.0–10.5)
nRBC: 0 % (ref 0.0–0.2)

## 2022-08-13 LAB — BASIC METABOLIC PANEL
Anion gap: 4 — ABNORMAL LOW (ref 5–15)
BUN: 11 mg/dL (ref 6–20)
CO2: 27 mmol/L (ref 22–32)
Calcium: 9 mg/dL (ref 8.9–10.3)
Chloride: 107 mmol/L (ref 98–111)
Creatinine, Ser: 1.02 mg/dL — ABNORMAL HIGH (ref 0.44–1.00)
GFR, Estimated: >60 mL/min (ref >60)
Glucose, Bld: 111 mg/dL — ABNORMAL HIGH (ref 70–99)
Potassium: 3.1 mmol/L — ABNORMAL LOW (ref 3.5–5.1)
Sodium: 138 mmol/L (ref 135–145)

## 2022-08-13 LAB — TROPONIN I (HIGH SENSITIVITY): Troponin I (High Sensitivity): 3 ng/L (ref <18)

## 2022-08-13 NOTE — ED Triage Notes (Addendum)
Pt presents to ED via POV c/o left shoulder pain and chest pain that started last night. Pt states left shoulder feels like its out of place. Pt also complaining of chest pain, back pain, and neck pain. Pt reports no injury to shoulder

## 2022-08-13 NOTE — ED Notes (Signed)
Pt asking what repeat blood work in for. This tech informed pt what blood work I was needing to repeat. Pt refusing to have repeat troponin drawn at this time. Pt stated, "it ain't my chest, it is my shoulder." Lattie Haw, RN made aware.

## 2022-08-17 ENCOUNTER — Emergency Department
Admission: EM | Admit: 2022-08-17 | Discharge: 2022-08-17 | Disposition: A | Discharge status: Home / Self Care | Payer: Medicaid Other | Attending: Emergency Medicine | Admitting: Emergency Medicine

## 2022-08-17 ENCOUNTER — Emergency Department: Payer: Medicaid Other

## 2022-08-17 ENCOUNTER — Other Ambulatory Visit: Payer: Self-pay

## 2022-08-17 DIAGNOSIS — F17200 Nicotine dependence, unspecified, uncomplicated: Secondary | ICD-10-CM | POA: Insufficient documentation

## 2022-08-17 DIAGNOSIS — K219 Gastro-esophageal reflux disease without esophagitis: Secondary | ICD-10-CM | POA: Diagnosis not present

## 2022-08-17 DIAGNOSIS — E876 Hypokalemia: Secondary | ICD-10-CM | POA: Diagnosis not present

## 2022-08-17 DIAGNOSIS — R0789 Other chest pain: Secondary | ICD-10-CM | POA: Diagnosis present

## 2022-08-17 LAB — BASIC METABOLIC PANEL
Anion gap: 6 (ref 5–15)
BUN: 11 mg/dL (ref 6–20)
CO2: 24 mmol/L (ref 22–32)
Calcium: 8.7 mg/dL — ABNORMAL LOW (ref 8.9–10.3)
Chloride: 108 mmol/L (ref 98–111)
Creatinine, Ser: 0.92 mg/dL (ref 0.44–1.00)
GFR, Estimated: >60 mL/min (ref >60)
Glucose, Bld: 109 mg/dL — ABNORMAL HIGH (ref 70–99)
Potassium: 3.2 mmol/L — ABNORMAL LOW (ref 3.5–5.1)
Sodium: 138 mmol/L (ref 135–145)

## 2022-08-17 LAB — TROPONIN I (HIGH SENSITIVITY): Troponin I (High Sensitivity): <2 ng/L (ref <18)

## 2022-08-17 LAB — CBC
HCT: 39.6 % (ref 36.0–46.0)
Hemoglobin: 12.7 g/dL (ref 12.0–15.0)
MCH: 29.3 pg (ref 26.0–34.0)
MCHC: 32.1 g/dL (ref 30.0–36.0)
MCV: 91.2 fL (ref 80.0–100.0)
Platelets: 272 10*3/uL (ref 150–400)
RBC: 4.34 MIL/uL (ref 3.87–5.11)
RDW: 13.8 % (ref 11.5–15.5)
WBC: 11.2 10*3/uL — ABNORMAL HIGH (ref 4.0–10.5)
nRBC: 0 % (ref 0.0–0.2)

## 2022-08-17 MED ORDER — ALUM & MAG HYDROXIDE-SIMETH 200-200-20 MG/5ML PO SUSP
30.0000 mL | Freq: Once | ORAL | Status: AC
Start: 2022-08-17 — End: 2022-08-17
  Administered 2022-08-17: 30 mL via ORAL
  Filled 2022-08-17: qty 30

## 2022-08-17 MED ORDER — POTASSIUM CHLORIDE CRYS ER 20 MEQ PO TBCR
40.0000 meq | EXTENDED_RELEASE_TABLET | Freq: Once | ORAL | Status: AC
  Administered 2022-08-17: 40 meq via ORAL
  Filled 2022-08-17: qty 2

## 2022-08-17 MED ORDER — LIDOCAINE VISCOUS HCL 2 % MT SOLN
15.0000 mL | Freq: Once | OROMUCOSAL | Status: AC
Start: 2022-08-17 — End: 2022-08-17
  Administered 2022-08-17: 15 mL via ORAL
  Filled 2022-08-17: qty 15

## 2022-08-17 MED ORDER — POTASSIUM CHLORIDE CRYS ER 20 MEQ PO TBCR: 20.0000 meq | EXTENDED_RELEASE_TABLET | Freq: Two times a day (BID) | ORAL | 0 refills | Status: DC

## 2022-08-17 MED ORDER — FAMOTIDINE 20 MG PO TABS: 20.0000 mg | ORAL_TABLET | Freq: Two times a day (BID) | ORAL | 2 refills | Status: DC

## 2022-08-17 NOTE — ED Triage Notes (Signed)
Pt comes with c/o chest pain mid sternal to left side. Pt states it is worse at night when she lays down. Pt denies any arm pain. Pt states no SOB

## 2022-08-17 NOTE — ED Notes (Signed)
Pt reports "I feel so much better".

## 2022-08-17 NOTE — Discharge Instructions (Signed)
Your chest pain work-up is normal and reassuring. The labs, EKG, chest chest XR, and exam do not show a heart-related cause for your symptoms. Your symptoms as likely due to reflux disease. Take the prescription meds as directed. Follow-up with a local primary provider for continued symptom management, and routine medical care. You may need a sleep study to determine if you have sleep apnea as well.

## 2022-08-17 NOTE — ED Notes (Signed)
Pt requesting to use restroom before triage.

## 2022-08-17 NOTE — ED Provider Notes (Signed)
[facility] Emergency Department Provider Note     Event Date/Time   First MD Initiated Contact with Patient 08/17/22 1722     (approximate)   History   Chest Pain   HPI  Amy Mcfarland is a 36 y.o. female with a history of juvenile arthritis, morbid obesity, and smoking, presents to the ED for evaluation of left-sided sternal chest pain for the last several nights. She describes the symptom as achy in nature. She reports heart racing and episodes of awakening from sound sleep, startled awake by a gagging sensation and is quite aware of heart rate. She notes symptoms are worsened with supine position. She denies any cough, congestion, SOB, or UE referral.  She presented last week for the same complaints, but left after triage secondary to the protracted wait.  She also reports that she has been poorly compliant with previously prescribed medicines for reflux.     Physical Exam   Triage Vital Signs: ED Triage Vitals  Enc Vitals Group     BP 08/17/22 1616 (!) 145/80     Pulse Rate 08/17/22 1616 74     Resp 08/17/22 1616 19     Temp 08/17/22 1616 98 F (36.7 C)     Temp src --      SpO2 08/17/22 1616 100 %     Weight 08/17/22 1741 278 lb 14.1 oz (126.5 kg)     Height 08/17/22 1741 5\' 6"  (1.676 m)     Head Circumference --      Peak Flow --      Pain Score 08/17/22 1615 8     Pain Loc --      Pain Edu? --      Excl. in [place]? --     Most recent vital signs: Vitals:   08/17/22 1616  BP: (!) 145/80  Pulse: 74  Resp: 19  Temp: 98 F (36.7 C)  SpO2: 100%    General Awake, no distress. NAD HEENT NCAT. PERRL. EOMI. No rhinorrhea. Mucous membranes are moist.  CV:  Good peripheral perfusion.  RESP:  Normal effort.  ABD:  No distention.    ED Results / Procedures / Treatments   Labs (all labs ordered are listed, but only abnormal results are displayed) Labs Reviewed  BASIC METABOLIC PANEL - Abnormal; Notable for the following components:       Result Value   Potassium 3.2 (*)    Glucose, Bld 109 (*)    Calcium 8.7 (*)    All other components within normal limits  CBC - Abnormal; Notable for the following components:   WBC 11.2 (*)    All other components within normal limits  POC URINE PREG, ED  TROPONIN I (HIGH SENSITIVITY)     EKG  Vent. rate 69 BPM PR interval 144 ms QRS duration 90 ms QT/QTcB 412/441 ms P-R-T axes 39 3 54  RADIOLOGY   I personally viewed and evaluated these images as part of my medical decision making, as well as reviewing the written report by the radiologist.  ED Provider Interpretation: no acute findings  DG Chest 2 View  Result Date: 08/17/2022 CLINICAL DATA:  Chest pain. EXAM: CHEST - 2 VIEW COMPARISON:  Chest x-ray dated August 13, 2022. FINDINGS: The heart size and mediastinal contours are within normal limits. Both lungs are clear. The visualized skeletal structures are unremarkable. IMPRESSION: No active cardiopulmonary disease. Electronically Signed   By: Titus Dubin M.D.   On:  08/17/2022 16:45     PROCEDURES:  Critical Care performed: No  Procedures   MEDICATIONS ORDERED IN ED: Medications  potassium chloride SA (KLOR-CON M) CR tablet 40 mEq (40 mEq Oral Given 08/17/22 1900)  alum & mag hydroxide-simeth (MAALOX/MYLANTA) 200-200-20 MG/5ML suspension 30 mL (30 mLs Oral Given 08/17/22 1900)    And  lidocaine (XYLOCAINE) 2 % viscous mouth solution 15 mL (15 mLs Oral Given 08/17/22 1900)     IMPRESSION / MDM / ASSESSMENT AND PLAN / ED COURSE  I reviewed the triage vital signs and the nursing notes.                              Differential diagnosis includes, but is not limited to, ACS, aortic dissection, pulmonary embolism, cardiac tamponade, pneumothorax, pneumonia, pericarditis, myocarditis, GI-related causes including esophagitis/gastritis, and musculoskeletal chest wall pain.    Patient's presentation is most consistent with acute presentation with potential  threat to life or bodily function.  Patient to the ED for evaluation of mid episodes of substernal chest pain that she describes as burning sensation as well as a gagging sensation that typically occurs when she is attempting to sleep.  She reports being sore from her sleep when the sensation occurs, noting heart racing.  She denies any shortness of breath, cough, or hemoptysis.  Patient's work-up is overall reassuring.  She has no acute evidence of ACS or AMI based on her lab report.  EKG is without malignant arrhythmia.  Chemistry panel does show a slightly decreased potassium at 3.1.  Troponin is negative x1, and patient does not endorse any current chest pain.  No radiologic evidence of any acute intrathoracic process, based on my interpretation of images.  Patient's diagnosis is consistent with atypical chest pain, likely GI in nature.  Patient reports significant improvement of her symptoms after GI cocktail provided in the ED.  Patient will be discharged home with prescriptions for famotidine and potassium. Patient is to follow up with a local internal medicine specialist for primary medical care, as needed or otherwise directed. Patient is given ED precautions to return to the ED for any worsening or new symptoms.     FINAL CLINICAL IMPRESSION(S) / ED DIAGNOSES   Final diagnoses:  Gastroesophageal reflux disease without esophagitis  Other chest pain     Rx / DC Orders   ED Discharge Orders          Ordered    famotidine (PEPCID) 20 MG tablet  2 times daily        08/17/22 1848    potassium chloride SA (KLOR-CON M) 20 MEQ tablet  2 times daily        08/17/22 1916             Note:  This document was prepared using Dragon voice recognition software and may include unintentional dictation errors.    Lissa Hoard, PA-C 08/17/22 2232    Merwyn Katos, MD 08/17/22 [phone]

## 2022-08-24 ENCOUNTER — Encounter (HOSPITAL_COMMUNITY): Payer: Self-pay | Admitting: Emergency Medicine

## 2022-08-24 ENCOUNTER — Ambulatory Visit (HOSPITAL_COMMUNITY)
Admission: EM | Admit: 2022-08-24 | Discharge: 2022-08-24 | Disposition: A | Discharge status: Home / Self Care | Payer: Medicaid Other | Attending: Family Medicine | Admitting: Family Medicine

## 2022-08-24 DIAGNOSIS — H1033 Unspecified acute conjunctivitis, bilateral: Secondary | ICD-10-CM

## 2022-08-24 HISTORY — DX: Heartburn: R12

## 2022-08-24 MED ORDER — GENTAMICIN SULFATE 0.3 % OP SOLN: 2.0000 [drp] | Freq: Three times a day (TID) | OPHTHALMIC | 0 refills | Status: AC

## 2022-08-24 NOTE — Discharge Instructions (Addendum)
Put gentamicin eyedrops in both eyes 3 times daily for 5 days  

## 2022-08-24 NOTE — ED Triage Notes (Signed)
Pt c/o bilat eye pain, watering and discharge for 3 days. Sensitive to light.

## 2022-08-24 NOTE — ED Provider Notes (Signed)
[facility]    CSN: [id number] Arrival date & time: 08/24/22  1551      History   Chief Complaint Chief Complaint  Patient presents with   Eye Drainage    HPI Amy Mcfarland is a 36 y.o. female.   HPI Here for a 2-day history of bilateral eye irritation and redness.  They have been tearing and having discharge.  She has had dried discharge on her eyelids when she wakes up in the morning.  No fever or chills or cough.  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 Authorizing Provider  famotidine (PEPCID) 20 MG tablet Take 1 tablet (20 mg total) by mouth 2 (two) times daily. 08/17/22 11/15/22  Menshew, Charlesetta Ivory, PA-C  ondansetron (ZOFRAN) 4 MG tablet Take 1 tablet (4 mg total) by mouth every 8 (eight) hours as needed for nausea or vomiting. 05/19/22   Cathlyn Parsons, NP  potassium chloride SA (KLOR-CON M) 20 MEQ tablet Take 1 tablet (20 mEq total) by mouth 2 (two) times daily for 5 days. 08/17/22 08/22/22  Menshew, Charlesetta Ivory, PA-C  SUMAtriptan (IMITREX) 25 MG tablet Take 1  tablet (25 mg total) by mouth every 2 (two) hours as needed for migraine. May repeat in 2 hours if headache persists or recurs.  Maximum of 3 tablets/day. 12/04/20   Particia Nearing, PA-C    Family History Family History  Problem Relation Age of Onset   Diabetes Mother    Hypertension Father    Diabetes Father    Asthma Maternal Uncle    Cancer Maternal Grandmother        breast cancer   Stomach cancer Paternal Grandfather     Social History Social History   Tobacco Use   Smoking status: Every Day    Packs/day: 1.00    Years: 7.00    Total pack years: 7.00    Types: Cigarettes   Smokeless tobacco: Never  Vaping Use   Vaping Use: Never used  Substance Use Topics   Alcohol use: Yes    Alcohol/week: 2.0 standard drinks of alcohol    Types: 2 Glasses of wine per week  Comment: last use 03/20/21   Drug use: Not Currently    Types: Marijuana    Comment: last use 2020     Allergies   Kiwi extract, Peach [prunus persica], and Strawberry extract   Review of Systems Review of Systems   Physical Exam Triage Vital Signs ED Triage Vitals  Enc Vitals Group     BP 08/24/22 1625 131/75     Pulse Rate 08/24/22 1625 83     Resp 08/24/22 1625 17     Temp 08/24/22 1625 99.1 F (37.3 C)     Temp Source 08/24/22 1625 Oral     SpO2 08/24/22 1625 100 %     Weight --      Height --      Head Circumference --      Peak Flow --      Pain Score 08/24/22 1623 8     Pain Loc --      Pain Edu? --      Excl. in [place]? --    No data found.  Updated Vital Signs BP 131/75 (BP Location: Left Arm)   Pulse 83   Temp 99.1 F (37.3 C) (Oral)   Resp 17   LMP 08/15/2022   SpO2 100%   Visual Acuity Right Eye Distance:   Left Eye Distance:   Bilateral Distance:    Right Eye Near:   Left Eye Near:    Bilateral Near:     Physical Exam Vitals reviewed.  Constitutional:      General: She is not in acute distress.    Appearance: She is not ill-appearing, toxic-appearing or  diaphoretic.  Eyes:     Extraocular Movements: Extraocular movements intact.     Pupils: Pupils are equal, round, and reactive to light.     Comments: There is some scant discharge at the inner canthi bilaterally.  There is mild injection of both conjunctiva.  The eyelids are not swollen  Musculoskeletal:     Cervical back: Neck supple.  Lymphadenopathy:     Cervical: No cervical adenopathy.  Skin:    Coloration: Skin is not jaundiced or pale.  Neurological:     Mental Status: She is alert and oriented to person, place, and time.  Psychiatric:        Behavior: Behavior normal.      UC Treatments / Results  Labs (all labs ordered are listed, but only abnormal results are displayed) Labs Reviewed - No data to display  EKG   Radiology No results found.  Procedures Procedures (including critical care time)  Medications Ordered in UC Medications - No data to display  Initial Impression / Assessment and Plan / UC Course  I have reviewed the triage vital signs and the nursing notes.  Pertinent labs & imaging results that were available during my care of the patient were reviewed by me and considered in my medical decision making (see chart for details).        I will treat with gentamicin eyedrops for conjunctivitis.  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 Final Clinical Impressions(s) / UC Diagnoses   Final diagnoses:  None   Discharge Instructions   None    ED Prescriptions   None    PDMP not reviewed this encounter.   Barrett Henle, MD 08/24/22 1655

## 2022-10-12 ENCOUNTER — Ambulatory Visit (HOSPITAL_COMMUNITY)
Admission: EM | Admit: 2022-10-12 | Discharge: 2022-10-12 | Disposition: A | Discharge status: Home / Self Care | Payer: Medicaid Other | Attending: Physician Assistant | Admitting: Physician Assistant

## 2022-10-12 ENCOUNTER — Encounter (HOSPITAL_COMMUNITY): Payer: Self-pay

## 2022-10-12 DIAGNOSIS — Z1152 Encounter for screening for COVID-19: Secondary | ICD-10-CM | POA: Diagnosis present

## 2022-10-12 DIAGNOSIS — J069 Acute upper respiratory infection, unspecified: Secondary | ICD-10-CM | POA: Diagnosis present

## 2022-10-12 LAB — RESP PANEL BY RT-PCR (FLU A&B, COVID) ARPGX2
Influenza A by PCR: NEGATIVE
Influenza B by PCR: NEGATIVE
SARS Coronavirus 2 by RT PCR: NEGATIVE

## 2022-10-12 NOTE — ED Triage Notes (Signed)
Pt c/o cough, congestion, headache, body aches, loss of taste, fatigue, and scratchy throat x2 days. States had a neg covid test. 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 States took thermaflu last night with no relief.

## 2022-10-12 NOTE — ED Provider Notes (Signed)
[facility]    CSN: [id number] Arrival date & time: 10/12/22  6073      History   Chief Complaint Chief Complaint  Patient presents with   Cough    HPI Amy Mcfarland is a 36 y.o. female.   Patient here today for evaluation of cough, congestion, headache, body aches, loss of taste, fatigue and scratchy throat that started 2 days ago.  She does not report fever.  She reports she did take a COVID test at home that was negative.  She has had COVID exposure however as her son is currently positive for COVID as well as 16 clients at her work.  She states she took TheraFlu last night without significant relief.  She has had some diarrhea but denies any vomiting.  The history is provided by the patient.    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 Authorizing Provider  famotidine (PEPCID) 20 MG tablet Take 1 tablet (20 mg total) by mouth 2 (two) times daily. 08/17/22 11/15/22  Menshew, Charlesetta Ivory, PA-C  potassium chloride SA (KLOR-CON M) 20 MEQ tablet Take 1 tablet (20 mEq total) by mouth 2 (two) times daily for 5 days. 08/17/22 08/22/22  Menshew,  Charlesetta Ivory, PA-C  SUMAtriptan (IMITREX) 25 MG tablet Take 1 tablet (25 mg total) by mouth every 2 (two) hours as needed for migraine. May repeat in 2 hours if headache persists or recurs.  Maximum of 3 tablets/day. 12/04/20   Particia Nearing, PA-C    Family History Family History  Problem Relation Age of Onset   Diabetes Mother    Hypertension Father    Diabetes Father    Asthma Maternal Uncle    Cancer Maternal Grandmother        breast cancer   Stomach cancer Paternal Grandfather     Social History Social History   Tobacco Use   Smoking status: Every Day    Packs/day: 1.00    Years: 7.00    Total pack years: 7.00    Types: Cigarettes   Smokeless tobacco: Never  Vaping Use   Vaping Use: Never used  Substance Use Topics   Alcohol use: Yes    Alcohol/week: 2.0 standard drinks of alcohol  Types: 2 Glasses of wine per week    Comment: last use 03/20/21   Drug use: Not Currently    Types: Marijuana    Comment: last use 2020     Allergies   Kiwi extract, Peach [prunus persica], and Strawberry extract   Review of Systems Review of Systems  Constitutional:  Positive for fatigue. Negative for chills and fever.  HENT:  Positive for congestion and sore throat. Negative for ear pain.   Eyes:  Negative for discharge and redness.  Respiratory:  Positive for cough. Negative for shortness of breath and wheezing.   Gastrointestinal:  Positive for diarrhea. Negative for abdominal pain, nausea and vomiting.     Physical Exam Triage Vital Signs ED Triage Vitals  Enc Vitals Group     BP      Pulse      Resp      Temp      Temp src      SpO2      Weight      Height      Head Circumference      Peak Flow      Pain Score      Pain Loc      Pain Edu?      Excl. in [place]?    No data found.  Updated Vital Signs BP (!) 151/88 (BP Location: Right Arm)   Pulse 79   Temp 97.9 F (36.6 C) (Oral)   Resp 18   LMP 10/05/2022   SpO2 98%      Physical  Exam Vitals and nursing note reviewed.  Constitutional:      General: She is not in acute distress.    Appearance: Normal appearance. She is not ill-appearing.  HENT:     Head: Normocephalic and atraumatic.     Right Ear: Tympanic membrane normal.     Left Ear: Tympanic membrane normal.     Nose: Congestion present.     Mouth/Throat:     Mouth: Mucous membranes are moist.     Pharynx: No oropharyngeal exudate or posterior oropharyngeal erythema.  Eyes:     Conjunctiva/sclera: Conjunctivae normal.  Cardiovascular:     Rate and Rhythm: Normal rate and regular rhythm.     Heart sounds: Normal heart sounds. No murmur heard. Pulmonary:     Effort: Pulmonary effort is normal. No respiratory distress.     Breath sounds: Normal breath sounds. No wheezing, rhonchi or rales.  Skin:    General: Skin is warm and dry.  Neurological:     Mental Status: She is alert.  Psychiatric:        Mood and Affect: Mood normal.        Thought Content: Thought content normal.      UC Treatments / Results  Labs (all labs ordered are listed, but only abnormal results are displayed) Labs Reviewed  RESP PANEL BY RT-PCR (FLU A&B, COVID) [id number]    EKG   Radiology No results found.  Procedures Procedures (including critical care time)  Medications Ordered in UC Medications - No data to display  Initial Impression / Assessment and Plan / UC Course  I have reviewed the triage vital signs and the nursing notes.  Pertinent labs & imaging results that were available during my care of the patient were reviewed by me and considered in my medical decision making (see chart for details).    Suspect viral etiology of symptoms.  Will screen for COVID and flu.  High suspicion for COVID given known exposure.  Will await results further recommendation but encouraged symptomatic treatment, increase fluids and rest.  Recommend follow-up if patient has any worsening symptoms or further concerns.  Final  Clinical Impressions(s) / UC Diagnoses   Final diagnoses:  Viral URI with cough  Encounter for screening for COVID-19   Discharge Instructions   None    ED Prescriptions   None    PDMP not reviewed this encounter.   Tomi Bamberger, PA-C 10/12/22 1023

## 2022-12-27 ENCOUNTER — Emergency Department (HOSPITAL_COMMUNITY): Payer: 59

## 2022-12-27 ENCOUNTER — Encounter (HOSPITAL_COMMUNITY): Payer: Self-pay | Admitting: Emergency Medicine

## 2022-12-27 ENCOUNTER — Emergency Department (HOSPITAL_COMMUNITY)
Admission: EM | Admit: 2022-12-27 | Discharge: 2022-12-27 | Disposition: A | Discharge status: Home / Self Care | Payer: 59 | Attending: Emergency Medicine | Admitting: Emergency Medicine

## 2022-12-27 ENCOUNTER — Other Ambulatory Visit: Payer: Self-pay

## 2022-12-27 DIAGNOSIS — F17200 Nicotine dependence, unspecified, uncomplicated: Secondary | ICD-10-CM | POA: Insufficient documentation

## 2022-12-27 DIAGNOSIS — Z8616 Personal history of COVID-19: Secondary | ICD-10-CM | POA: Insufficient documentation

## 2022-12-27 DIAGNOSIS — K219 Gastro-esophageal reflux disease without esophagitis: Secondary | ICD-10-CM | POA: Diagnosis not present

## 2022-12-27 DIAGNOSIS — D72829 Elevated white blood cell count, unspecified: Secondary | ICD-10-CM | POA: Insufficient documentation

## 2022-12-27 DIAGNOSIS — R072 Precordial pain: Secondary | ICD-10-CM | POA: Diagnosis present

## 2022-12-27 LAB — CBC WITH DIFFERENTIAL/PLATELET
Abs Immature Granulocytes: 0.03 10*3/uL (ref 0.00–0.07)
Basophils Absolute: 0.1 10*3/uL (ref 0.0–0.1)
Basophils Relative: 1 %
Eosinophils Absolute: 0.2 10*3/uL (ref 0.0–0.5)
Eosinophils Relative: 1 %
HCT: 40 % (ref 36.0–46.0)
Hemoglobin: 13.3 g/dL (ref 12.0–15.0)
Immature Granulocytes: 0 %
Lymphocytes Relative: 22 %
Lymphs Abs: 2.9 10*3/uL (ref 0.7–4.0)
MCH: 30.1 pg (ref 26.0–34.0)
MCHC: 33.3 g/dL (ref 30.0–36.0)
MCV: 90.5 fL (ref 80.0–100.0)
Monocytes Absolute: 1.2 10*3/uL — ABNORMAL HIGH (ref 0.1–1.0)
Monocytes Relative: 10 %
Neutro Abs: 8.4 10*3/uL — ABNORMAL HIGH (ref 1.7–7.7)
Neutrophils Relative %: 66 %
Platelets: 263 10*3/uL (ref 150–400)
RBC: 4.42 MIL/uL (ref 3.87–5.11)
RDW: 14 % (ref 11.5–15.5)
WBC: 12.8 10*3/uL — ABNORMAL HIGH (ref 4.0–10.5)
nRBC: 0 % (ref 0.0–0.2)

## 2022-12-27 LAB — COMPREHENSIVE METABOLIC PANEL
ALT: 17 U/L (ref 0–44)
AST: 15 U/L (ref 15–41)
Albumin: 3.4 g/dL — ABNORMAL LOW (ref 3.5–5.0)
Alkaline Phosphatase: 54 U/L (ref 38–126)
Anion gap: 8 (ref 5–15)
BUN: 11 mg/dL (ref 6–20)
CO2: 22 mmol/L (ref 22–32)
Calcium: 8.5 mg/dL — ABNORMAL LOW (ref 8.9–10.3)
Chloride: 104 mmol/L (ref 98–111)
Creatinine, Ser: 0.97 mg/dL (ref 0.44–1.00)
GFR, Estimated: >60 mL/min (ref >60)
Glucose, Bld: 102 mg/dL — ABNORMAL HIGH (ref 70–99)
Potassium: 3.7 mmol/L (ref 3.5–5.1)
Sodium: 134 mmol/L — ABNORMAL LOW (ref 135–145)
Total Bilirubin: 0.4 mg/dL (ref 0.3–1.2)
Total Protein: 7 g/dL (ref 6.5–8.1)

## 2022-12-27 LAB — TROPONIN I (HIGH SENSITIVITY)
Troponin I (High Sensitivity): <2 ng/L (ref <18)
Troponin I (High Sensitivity): <2 ng/L (ref <18)

## 2022-12-27 LAB — I-STAT BETA HCG BLOOD, ED (MC, WL, AP ONLY): I-stat hCG, quantitative: <5 m[IU]/mL (ref <5)

## 2022-12-27 LAB — LACTIC ACID, PLASMA: Lactic Acid, Venous: 0.8 mmol/L (ref 0.5–1.9)

## 2022-12-27 MED ORDER — ONDANSETRON HCL 4 MG/2ML IJ SOLN
4.0000 mg | Freq: Once | INTRAMUSCULAR | Status: AC
  Administered 2022-12-27: 4 mg via INTRAVENOUS
  Filled 2022-12-27: qty 2

## 2022-12-27 MED ORDER — LIDOCAINE VISCOUS HCL 2 % MT SOLN: 10.0000 mL | Freq: Four times a day (QID) | OROMUCOSAL | 0 refills | Status: DC | PRN

## 2022-12-27 MED ORDER — SODIUM CHLORIDE 0.9 % IV BOLUS
1000.0000 mL | Freq: Once | INTRAVENOUS | Status: AC
  Administered 2022-12-27: 1000 mL via INTRAVENOUS

## 2022-12-27 MED ORDER — ALUM & MAG HYDROXIDE-SIMETH 200-200-20 MG/5ML PO SUSP
30.0000 mL | Freq: Once | ORAL | Status: AC
  Administered 2022-12-27: 30 mL via ORAL
  Filled 2022-12-27: qty 30

## 2022-12-27 MED ORDER — LIDOCAINE VISCOUS HCL 2 % MT SOLN
15.0000 mL | Freq: Once | OROMUCOSAL | Status: AC
  Administered 2022-12-27: 15 mL via ORAL
  Filled 2022-12-27: qty 15

## 2022-12-27 NOTE — ED Notes (Signed)
Patient transported to X-ray 

## 2022-12-27 NOTE — ED Provider Notes (Signed)
[facility] Provider Note  CSN: [id number] Arrival date & time: 12/27/22 0127  Chief Complaint(s) Chest Pain  HPI Amy Mcfarland is a 37 y.o. female    The history is provided by the patient.  Chest Pain Pain location:  Substernal area Pain quality: pressure   Pain radiates to:  L jaw and R jaw Pain severity:  Moderate Duration:  3 hours Timing:  Constant Progression:  Waxing and waning Chronicity:  New Relieved by:  Nothing Worsened by:  Nothing Associated symptoms: heartburn, nausea and vomiting   Associated symptoms: no cough, no fever and no headache    Patient initially thought this was acid reflux and took Tums.  Since that had minimal relief in the first hour, she came in for evaluation.  Pain has improved since being here.   Past Medical History Past Medical History:  Diagnosis Date   Heart burn    Kidney infection    Kidney infection    Migraine    Migraines    Miscarriage    Patient Active Problem List   Diagnosis Date Noted   COVID-19 01/13/21; hospitalized x 12 hrs 03/24/2021   Smoker 1 ppd 10/23/2020   Migraine without aura 11/22/2019   Seasonal allergies 11/22/2019   Juvenile arthritis ([place]) 07/06/2018   Morbid obesity ([place]) BMI=50.5 313 lbs 07/06/2018   Home Medication(s) Prior to Admission medications   Medication Sig Start Date End Date Taking? Authorizing Provider  famotidine (PEPCID) 20 MG tablet Take 1 tablet (20 mg total) by mouth 2 (two) times daily. 08/17/22 11/15/22  Menshew, Dannielle Karvonen, PA-C  potassium chloride SA (KLOR-CON M) 20 MEQ tablet Take 1 tablet (20 mEq total) by mouth 2 (two) times daily for 5 days. 08/17/22 08/22/22  Menshew, Dannielle Karvonen, PA-C  SUMAtriptan (IMITREX) 25 MG tablet Take 1 tablet (25 mg total) by mouth every 2 (two) hours as needed for migraine. May repeat in 2 hours if headache persists or recurs.  Maximum of 3 tablets/day. 12/04/20   Volney American, PA-C                                                                                                                                     Allergies Kiwi extract, Peach [prunus persica], and Strawberry extract  Review of Systems Review of Systems  Constitutional:  Negative for fever.  Respiratory:  Negative for cough.   Cardiovascular:  Positive for chest pain.  Gastrointestinal:  Positive for heartburn, nausea and vomiting.  Neurological:  Negative for headaches.   As noted in HPI  Physical Exam Vital Signs  I have reviewed the triage vital signs BP 126/71   Pulse 76   Temp 98.6 F (37 C) (Oral)   Resp 15   Ht 5' 6"$  (1.676 m)   Wt 135.2 kg   SpO2 99%   BMI 48.10 kg/m   Physical  Exam Vitals reviewed.  Constitutional:      General: She is not in acute distress.    Appearance: She is well-developed. She is obese. She is not diaphoretic.  HENT:     Head: Normocephalic and atraumatic.     Nose: Nose normal.  Eyes:     General: No scleral icterus.       Right eye: No discharge.        Left eye: No discharge.     Conjunctiva/sclera: Conjunctivae normal.     Pupils: Pupils are equal, round, and reactive to light.  Cardiovascular:     Rate and Rhythm: Normal rate and regular rhythm.     Heart sounds: No murmur heard.    No friction rub. No gallop.  Pulmonary:     Effort: Pulmonary effort is normal. No respiratory distress.     Breath sounds: Normal breath sounds. No stridor. No rales.  Abdominal:     General: There is no distension.     Palpations: Abdomen is soft.     Tenderness: There is no abdominal tenderness.  Musculoskeletal:        General: No tenderness.     Cervical back: Normal range of motion and neck supple.  Skin:    General: Skin is warm and dry.     Findings: No erythema or rash.  Neurological:     Mental Status: She is alert and oriented to person, place, and time.     ED Results and Treatments Labs (all labs ordered are listed, but only abnormal results  are displayed) Labs Reviewed  COMPREHENSIVE METABOLIC PANEL - Abnormal; Notable for the following components:      Result Value   Sodium 134 (*)    Glucose, Bld 102 (*)    Calcium 8.5 (*)    Albumin 3.4 (*)    All other components within normal limits  CBC WITH DIFFERENTIAL/PLATELET - Abnormal; Notable for the following components:   WBC 12.8 (*)    Neutro Abs 8.4 (*)    Monocytes Absolute 1.2 (*)    All other components within normal limits  LACTIC ACID, PLASMA  URINALYSIS, ROUTINE W REFLEX MICROSCOPIC  I-STAT BETA HCG BLOOD, ED ([facility], [facility], [facility] ONLY)  TROPONIN I (HIGH SENSITIVITY)  TROPONIN I (HIGH SENSITIVITY)                                                                                                                         EKG  EKG Interpretation  Date/Time:  Monday December 27 2022 02:37:58 EST Ventricular Rate:  76 PR Interval:  145 QRS Duration: 96 QT Interval:  404 QTC Calculation: 455 R Axis:   5 Text Interpretation: Sinus rhythm Left ventricular hypertrophy No acute changes Confirmed by Addison Lank [phone]) on 12/27/2022 2:46:23 AM       Radiology DG Chest 2 View  Result Date: 12/27/2022 CLINICAL DATA:  cp EXAM: CHEST - 2 VIEW COMPARISON:  Chest x-ray 08/17/2022, CT chest 10/13/2009 FINDINGS:  The heart and mediastinal contours are within normal limits. No focal consolidation. No pulmonary edema. No pleural effusion. No pneumothorax. No acute osseous abnormality. IMPRESSION: No active cardiopulmonary disease. Electronically Signed   By: Iven Finn M.D.   On: 12/27/2022 02:51    Medications Ordered in ED Medications  ondansetron (ZOFRAN) injection 4 mg (4 mg Intravenous Given 12/27/22 0335)  sodium chloride 0.9 % bolus 1,000 mL (1,000 mLs Intravenous New Bag/Given 12/27/22 0333)  alum & mag hydroxide-simeth (MAALOX/MYLANTA) 200-200-20 MG/5ML suspension 30 mL (30 mLs Oral Given 12/27/22 0336)    And  lidocaine (XYLOCAINE) 2 % viscous mouth solution 15 mL (15 mLs  Oral Given 12/27/22 0336)                                                                                                                                     Procedures Procedures  (including critical care time)  Medical Decision Making / ED Course   Medical Decision Making Amount and/or Complexity of Data Reviewed Labs: ordered. Decision-making details documented in ED Course. Radiology: ordered and independent interpretation performed. Decision-making details documented in ED Course. ECG/medicine tests: ordered and independent interpretation performed. Decision-making details documented in ED Course.  Risk OTC drugs. Prescription drug management.    This patient presents to the ED for concern of chest pain, this involves an extensive number of treatment options, and is a complaint that carries with it a high risk of complications and morbidity. The differential diagnosis includes but not limited to GERD/acid reflux, ACS.  Less suspicious but will assess for pneumonia, pneumothorax, pulmonary edema.  Presentation not concerning for pulmonary embolism, aortic dissection or esophageal perforation.  EKG without acute ischemic changes or evidence of pericarditis. Initial troponin negative.  Patient's heart score less than 3 and appropriate for delta troponin.  CBC with mild leukocytosis.  No anemia. Metabolic panel without significant electrolyte derangements or renal insufficiency.  No evidence of bili obstruction. Beta-hCG obtained to help guide care was negative. Delta troponin negative  Chest x-ray without evidence of pneumonia, pneumothorax, pulmonary edema or pleural effusions.  Patient's pain completely resolved after GI cocktail.       Final Clinical Impression(s) / ED Diagnoses Final diagnoses:  Chest pain due to GERD   The patient appears reasonably screened and/or stabilized for discharge and I doubt any other medical condition or other [facility] requiring further  screening, evaluation, or treatment in the ED at this time. I have discussed the findings, Dx and Tx plan with the patient/family who expressed understanding and agree(s) with the plan. Discharge instructions discussed at length. The patient/family was given strict return precautions who verbalized understanding of the instructions. No further questions at time of discharge.  Disposition: Discharge  Condition: Good  ED Discharge Orders     None       Follow Up: Primary care provider  Schedule an appointment as soon as possible for a visit  contact [facility] at [phone] for referral, if you have not been called about your appointment            This chart was dictated using voice recognition software.  Despite best efforts to proofread,  errors can occur which can change the documentation meaning.    Fatima Blank, MD 12/27/22 [phone]

## 2022-12-27 NOTE — ED Triage Notes (Signed)
Pt reports she was woken up out of sleep substernal chest pressure that moves to her back.  She is nauseas and reports "I feel like my jaw is locking."  Pt indicated that her legs are also aching.  "I feel weird all over."  Pt is having emesis in triage.

## 2023-01-10 ENCOUNTER — Encounter (HOSPITAL_COMMUNITY): Payer: Self-pay

## 2023-01-10 ENCOUNTER — Ambulatory Visit (HOSPITAL_COMMUNITY)
Admission: EM | Admit: 2023-01-10 | Discharge: 2023-01-10 | Disposition: A | Discharge status: Home / Self Care | Payer: 59 | Attending: Internal Medicine | Admitting: Internal Medicine

## 2023-01-10 DIAGNOSIS — Z1152 Encounter for screening for COVID-19: Secondary | ICD-10-CM | POA: Diagnosis not present

## 2023-01-10 DIAGNOSIS — J111 Influenza due to unidentified influenza virus with other respiratory manifestations: Secondary | ICD-10-CM | POA: Diagnosis present

## 2023-01-10 DIAGNOSIS — F17210 Nicotine dependence, cigarettes, uncomplicated: Secondary | ICD-10-CM | POA: Insufficient documentation

## 2023-01-10 LAB — POCT RAPID STREP A, ED / UC: Streptococcus, Group A Screen (Direct): NEGATIVE

## 2023-01-10 MED ORDER — OSELTAMIVIR PHOSPHATE 75 MG PO CAPS: 75.0000 mg | ORAL_CAPSULE | Freq: Two times a day (BID) | ORAL | 0 refills | Status: DC

## 2023-01-10 MED ORDER — LIDOCAINE VISCOUS HCL 2 % MT SOLN: 15.0000 mL | OROMUCOSAL | 0 refills | Status: DC | PRN

## 2023-01-10 NOTE — Discharge Instructions (Signed)
You will get a call if tests are positive, you will not get a call if tests are negative but you can check results in MyChart if you have a MyChart account.    Try Mucinex per package instructions and saline nasal spray several times a day. Other things that can help congestion are things like Vicks VapoRub, steamy showers, hot tea or hot soup, etc.   Use tylenol or ibuprofen as directed on the package for pain or fever.

## 2023-01-10 NOTE — ED Triage Notes (Signed)
Pt c/o sore throat, rt ear pain, chills, and body aches x2 days. Denies taking any meds today.

## 2023-01-11 LAB — SARS CORONAVIRUS 2 (TAT 6-24 HRS): SARS Coronavirus 2: NEGATIVE

## 2023-01-12 LAB — CULTURE, GROUP A STREP (THRC)

## 2023-01-13 NOTE — ED Provider Notes (Signed)
[facility]    CSN: [id number] Arrival date & time: 01/10/23  1242      History   Chief Complaint Chief Complaint  Patient presents with   Sore Throat    HPI Amy Mcfarland is a 37 y.o. female. Pt c/o sore throat, rt ear pain, chills, and body aches x2 days. Denies taking any meds today. Symptoms were abrupt onset. Did not test self for covid at home. Did not take temperature at home.   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 Authorizing Provider  lidocaine (XYLOCAINE) 2 % solution Use as directed 15 mLs in the mouth or throat as needed for mouth pain. 01/10/23  Yes Carvel Getting, NP  oseltamivir (TAMIFLU) 75 MG capsule Take 1 capsule (75 mg total) by mouth 2 (two) times daily. 01/10/23  Yes Carvel Getting, NP  famotidine (PEPCID) 20 MG tablet Take 1 tablet (20 mg total) by mouth 2 (two) times daily. 08/17/22 11/15/22  Menshew, Dannielle Karvonen, PA-C  SUMAtriptan (IMITREX) 25 MG tablet Take 1 tablet (25 mg total) by mouth every 2 (two) hours as needed for migraine. May repeat in 2 hours if headache persists or recurs.  Maximum  of 3 tablets/day. 12/04/20   Volney American, PA-C    Family History Family History  Problem Relation Age of Onset   Diabetes Mother    Hypertension Father    Diabetes Father    Asthma Maternal Uncle    Cancer Maternal Grandmother        breast cancer   Stomach cancer Paternal Grandfather     Social History Social History   Tobacco Use   Smoking status: Every Day    Packs/day: 1.00    Years: 7.00    Total pack years: 7.00    Types: Cigarettes   Smokeless tobacco: Never  Vaping Use   Vaping Use: Never used  Substance Use Topics   Alcohol use: Yes    Alcohol/week: 2.0 standard drinks of alcohol    Types: 2 Glasses of wine per week    Comment: last use 03/20/21   Drug use: Not Currently    Types: Marijuana    Comment: last use 2020  Allergies   Kiwi extract, Peach [prunus persica], and Strawberry extract   Review of Systems Review of Systems   Physical Exam Triage Vital Signs ED Triage Vitals  Enc Vitals Group     BP 01/10/23 1427 (!) 138/96     Pulse Rate 01/10/23 1427 90     Resp 01/10/23 1427 18     Temp 01/10/23 1427 98.8 F (37.1 C)     Temp Source 01/10/23 1427 Oral     SpO2 01/10/23 1427 98 %     Weight --      Height --      Head Circumference --      Peak Flow --      Pain Score 01/10/23 1428 10     Pain Loc --      Pain Edu? --      Excl. in [place]? --    No data found.  Updated Vital Signs BP (!) 138/96 (BP Location: Left Arm)   Pulse 90   Temp 98.8 F (37.1 C) (Oral)   Resp 18   SpO2 98%   Visual Acuity Right Eye Distance:   Left Eye Distance:   Bilateral Distance:    Right Eye Near:   Left Eye Near:    Bilateral Near:     Physical Exam Constitutional:      General: She is not in acute distress.    Appearance: She is well-developed. She is ill-appearing.  HENT:     Right Ear: Tympanic membrane, ear canal and external ear normal.     Left Ear: Tympanic membrane, ear canal and external ear normal.     Nose:  Congestion present.     Mouth/Throat:     Mouth: Mucous membranes are moist.     Pharynx: Oropharynx is clear. No oropharyngeal exudate or posterior oropharyngeal erythema.  Neurological:     Mental Status: She is alert.      UC Treatments / Results  Labs (all labs ordered are listed, but only abnormal results are displayed) Labs Reviewed  CULTURE, GROUP A STREP ([place])  SARS CORONAVIRUS 2 (TAT 6-24 HRS)  POCT RAPID STREP A, ED / UC    EKG   Radiology No results found.  Procedures Procedures (including critical care time)  Medications Ordered in UC Medications - No data to display  Initial Impression / Assessment and Plan / UC Course  I have reviewed the triage vital signs and the nursing notes.  Pertinent labs & imaging results that were available during my care of the patient were reviewed by me and considered in my medical decision making (see chart for details).    Rapid strep negative. I do not believe she has strep given other symptoms but strep has had an unusual presentation in some people this year. I suspect she may have flu. We do not have rapid testing here and she is just now at 2 days of symptoms. Will rx tamiflu and test for covid.   Final Clinical Impressions(s) / UC Diagnoses   Final diagnoses:  Influenza-like illness     Discharge Instructions      You will get a call if tests are positive, you will not get a call if tests are negative but you can check results in MyChart if you have a MyChart account.    Try Mucinex per package instructions and saline nasal spray several times a day. Other things that can help congestion are things like Vicks VapoRub, steamy showers, hot tea or  hot soup, etc.   Use tylenol or ibuprofen as directed on the package for pain or fever.    ED Prescriptions     Medication Sig Dispense Auth. Provider   oseltamivir (TAMIFLU) 75 MG capsule Take 1 capsule (75 mg total) by mouth 2 (two) times daily. 10 capsule Carvel Getting, NP   lidocaine (XYLOCAINE) 2 % solution Use as directed 15 mLs in the mouth or throat as needed for mouth pain. 100 mL Carvel Getting, NP      PDMP not reviewed this encounter.   Carvel Getting, NP 01/14/23 1121

## 2023-01-20 ENCOUNTER — Ambulatory Visit: Payer: 59 | Admitting: Family Medicine

## 2023-02-23 ENCOUNTER — Ambulatory Visit (HOSPITAL_COMMUNITY): Payer: Self-pay

## 2023-03-16 ENCOUNTER — Ambulatory Visit (HOSPITAL_COMMUNITY)
Admission: RE | Admit: 2023-03-16 | Discharge: 2023-03-16 | Discharge status: Against Medical Advice | Payer: Medicaid Other | Source: Ambulatory Visit

## 2023-03-16 NOTE — ED Provider Notes (Signed)
Patient left without being seen    Providence Crosby, PA-C 03/16/23 1646

## 2023-03-17 ENCOUNTER — Ambulatory Visit (HOSPITAL_COMMUNITY): Payer: Self-pay

## 2023-03-21 ENCOUNTER — Ambulatory Visit (HOSPITAL_COMMUNITY): Payer: Self-pay

## 2023-03-24 ENCOUNTER — Ambulatory Visit (HOSPITAL_COMMUNITY): Payer: Self-pay

## 2023-05-02 ENCOUNTER — Telehealth: Payer: Self-pay | Admitting: Family Medicine

## 2023-05-02 NOTE — Telephone Encounter (Signed)
Pt called about getting her depo since she just got into a relationship. I let her know we don't administrate depo without a physical but she was adamant about having been able to get her depo before + then coming back for a physical. She would like to speak to someone about coming in sooner, I let her know I'd put in the phone note.

## 2023-05-02 NOTE — Telephone Encounter (Signed)
Pt notified that she will need to schedule an appointment to be seen by a Provider.  Pt verbalizes understanding.

## 2023-05-17 ENCOUNTER — Ambulatory Visit (HOSPITAL_COMMUNITY): Payer: Self-pay

## 2023-05-31 ENCOUNTER — Ambulatory Visit: Payer: 59

## 2023-07-05 ENCOUNTER — Ambulatory Visit (HOSPITAL_COMMUNITY)
Admission: RE | Admit: 2023-07-05 | Discharge: 2023-07-05 | Disposition: A | Discharge status: Home / Self Care | Payer: 59 | Source: Ambulatory Visit | Attending: Family Medicine | Admitting: Family Medicine

## 2023-07-05 ENCOUNTER — Encounter (HOSPITAL_COMMUNITY): Payer: Self-pay

## 2023-07-05 VITALS — BP 125/84 | HR 90 | Temp 98.2°F | Resp 18

## 2023-07-05 DIAGNOSIS — M25511 Pain in right shoulder: Secondary | ICD-10-CM

## 2023-07-05 MED ORDER — CYCLOBENZAPRINE HCL 10 MG PO TABS: ORAL_TABLET | ORAL | 0 refills | Status: DC

## 2023-07-05 MED ORDER — PREDNISONE 20 MG PO TABS: 40.0000 mg | ORAL_TABLET | Freq: Every day | ORAL | 0 refills | Status: DC

## 2023-07-05 MED ORDER — DICLOFENAC SODIUM 75 MG PO TBEC: 75.0000 mg | DELAYED_RELEASE_TABLET | Freq: Two times a day (BID) | ORAL | 0 refills | Status: DC

## 2023-07-05 NOTE — ED Provider Notes (Signed)
[facility]   [id number] 07/05/23 Arrival Time: 1451  ASSESSMENT & PLAN:  1. Acute pain of right shoulder    Able to ambulate here and hemodynamically stable. No indication for imaging.  Meds ordered this encounter  Medications   diclofenac (VOLTAREN) 75 MG EC tablet    Sig: Take 1 tablet (75 mg total) by mouth 2 (two) times daily.    Dispense:  14 tablet    Refill:  0   predniSONE (DELTASONE) 20 MG tablet    Sig: Take 2 tablets (40 mg total) by mouth daily.    Dispense:  10 tablet    Refill:  0   cyclobenzaprine (FLEXERIL) 10 MG tablet    Sig: Take 1 tablet by mouth 3 times daily as needed for muscle spasm. Warning: May cause drowsiness.    Dispense:  21 tablet    Refill:  0   Work/school excuse note: provided. Medication sedation precautions given. Encourage ROM/movement as tolerated.  Recommend:  Follow-up Information     [facility].   Specialty: Urgent Care Why: If worsening or failing to improve as anticipated. Contact information: [address] [place] [address] [phone]                Reviewed expectations re: course of current medical issues. Questions answered. Outlined signs and symptoms indicating need for more acute intervention. Patient verbalized understanding. After Visit Summary given.   SUBJECTIVE: History from: patient.  Amy Mcfarland is a 37 y.o. female who presents with complaint of R shoulder and upper back pain; grad onset; x few days. 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 No extremity sensation changes or weakness. No tx PTA.  Denies CP/SOB.   OBJECTIVE:  Vitals:   07/05/23 1506  BP: 125/84  Pulse: 90  Resp: 18  Temp: 98.2 F (36.8 C)  TempSrc: Oral  SpO2: 95%    General appearance: alert; no distress HEENT: [place]; AT Neck: supple with FROM; without midline tenderness CV: regular Lungs: unlabored respirations; speaks full sentences  without difficulty Abdomen: soft, non-tender; non-distended Back: moderate and poorly localized tenderness to palpation over R superior shoulder into R trapezius; also very TTP over proximal biceps insertion ; FROM at waist; bruising: none Extremities: without edema; symmetrical without gross deformities; normal ROM of bilateral UE Skin: warm and dry Neurologic: normal gait; normal sensation and strength of bilateral UE Psychological: alert and cooperative; normal mood and affect   Allergies  Allergen Reactions   Kiwi Extract Other (See Comments)    Makes mouth feel hairy   Peach [Prunus Persica] Other (See Comments)    Makes mouth feel hairy   Strawberry Extract Other (See Comments)    Makes mouth feel like it has hair in it, tongue sweeling    Past Medical History:  Diagnosis Date   Heart burn    Kidney infection    Kidney infection    Migraine    Migraines    Miscarriage    Social History   Socioeconomic History   Marital status: Single    Spouse name: Not on file   Number of children: Not on file   Years of education: Not on file   Highest education level: Not on file  Occupational History   Not on file  Tobacco Use   Smoking status: Every Day    Current packs/day: 1.00    Average packs/day: 1 pack/day for 7.0 years (7.0 ttl pk-yrs)  Types: Cigarettes   Smokeless tobacco: Never  Vaping Use   Vaping status: Never Used  Substance and Sexual Activity   Alcohol use: Yes    Alcohol/week: 2.0 standard drinks of alcohol    Types: 2 Glasses of wine per week    Comment: last use 03/20/21   Drug use: Not Currently    Types: Marijuana   Sexual activity: Yes    Partners: Male    Birth control/protection: Condom, Injection  Other Topics Concern   Not on file  Social History Narrative   Not on file   Social Determinants of Health   Financial Resource Strain: Not on file  Food Insecurity: Not on file  Transportation Needs: Not on file  Physical Activity: Not on  file  Stress: Not on file  Social Connections: Unknown (05/05/2023)   [facility]   Social Network    Social Network: Not on file  Intimate Partner Violence: Unknown (05/05/2023)   Received from [facility]   HITS    Physically Hurt: Not on file    Insult or Talk Down To: Not on file    Threaten Physical Harm: Not on file    Scream or Curse: Not on file   Family History  Problem Relation Age of Onset   Diabetes Mother    Hypertension Father    Diabetes Father    Asthma Maternal Uncle    Cancer Maternal Grandmother        breast cancer   Stomach cancer Paternal Grandfather    Past Surgical History:  Procedure Laterality Date   CESAREAN SECTION  2011   DILATION AND CURETTAGE OF UTERUS  2006      Mardella Layman, MD 07/05/23 1556

## 2023-07-05 NOTE — ED Triage Notes (Signed)
Pt states she has right upper back and shoulder pain since last Wednesday. 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   Pt states she has dental pain top and bottom on the right she is scheduled for surgery 07/20/2023

## 2023-08-16 ENCOUNTER — Ambulatory Visit (HOSPITAL_COMMUNITY): Payer: Self-pay

## 2023-08-19 ENCOUNTER — Ambulatory Visit (HOSPITAL_COMMUNITY): Payer: Self-pay

## 2023-09-11 ENCOUNTER — Encounter (HOSPITAL_COMMUNITY): Payer: Self-pay | Admitting: Pharmacy Technician

## 2023-09-11 ENCOUNTER — Other Ambulatory Visit: Payer: Self-pay

## 2023-09-11 ENCOUNTER — Emergency Department (HOSPITAL_COMMUNITY)
Admission: EM | Admit: 2023-09-11 | Discharge: 2023-09-11 | Disposition: A | Discharge status: Home / Self Care | Payer: 59 | Attending: Emergency Medicine | Admitting: Emergency Medicine

## 2023-09-11 DIAGNOSIS — G43809 Other migraine, not intractable, without status migrainosus: Secondary | ICD-10-CM | POA: Insufficient documentation

## 2023-09-11 DIAGNOSIS — R519 Headache, unspecified: Secondary | ICD-10-CM | POA: Diagnosis present

## 2023-09-11 MED ORDER — ONDANSETRON 4 MG PO TBDP
8.0000 mg | ORAL_TABLET | Freq: Once | ORAL | Status: AC
  Administered 2023-09-11: 8 mg via ORAL

## 2023-09-11 MED ORDER — DEXAMETHASONE SODIUM PHOSPHATE 10 MG/ML IJ SOLN
10.0000 mg | Freq: Once | INTRAMUSCULAR | Status: AC
  Administered 2023-09-11: 10 mg via INTRAMUSCULAR
  Filled 2023-09-11: qty 1

## 2023-09-11 MED ORDER — DIPHENHYDRAMINE HCL 25 MG PO CAPS
25.0000 mg | ORAL_CAPSULE | Freq: Once | ORAL | Status: AC
  Administered 2023-09-11: 25 mg via ORAL
  Filled 2023-09-11: qty 1

## 2023-09-11 MED ORDER — SUMATRIPTAN SUCCINATE 25 MG PO TABS: 25.0000 mg | ORAL_TABLET | ORAL | 0 refills | Status: AC | PRN

## 2023-09-11 NOTE — ED Provider Triage Note (Signed)
Emergency Medicine Provider Triage Evaluation Note  Amy Mcfarland , a 37 y.o. female  was evaluated in triage.  Pt complains of Migraine headache.  Review of Systems  Positive: Headache, photophobia, nausea  Negative: Vomiting, fever  Physical Exam  BP (!) 142/91 (BP Location: Right Arm)   Pulse 78   Temp 98.2 F (36.8 C) (Oral)   Resp 15   SpO2 100%  Gen:   Awake, no distress  Uncomfortable appearing Resp:  Normal effort  MSK:   Moves extremities without difficulty  Other:    Medical Decision Making  Medically screening exam initiated at 11:29 AM.  Appropriate orders placed.  [name] was informed that the remainder of the evaluation will be completed by another provider, this initial triage assessment does not replace that evaluation, and the importance of remaining in the ED until their evaluation is complete.  Benadryl PO and Decadron IM provided in triage.   She reports current headache x 2 days, typical of migraine history. Out of Imitrex at home.    Elpidio Anis, PA-C 09/11/23 1130

## 2023-09-11 NOTE — ED Provider Notes (Signed)
[facility] Provider Note   CSN: [id number] Arrival date & time: 09/11/23  1042     History  Chief Complaint  Patient presents with   Migraine    Amy Mcfarland is a 37 y.o. female.  37 year old female with prior medical history as detailed below presents for evaluation.  Patient reports history of migraine.  Patient reports onset of her typical migraine headache x 2 days.  Patient apparently is out of Imitrex.  Patient was given medications in triage for treatment of her headache.  At the time of my evaluation, the patient feels significantly improved.  She now desires discharge home.  Denies associated fever, nausea, vomiting, other significant complaint.  The history is provided by the patient and medical records.       Home Medications Prior to Admission medications   Medication Sig Start Date End Date Taking? Authorizing Provider  cyclobenzaprine (FLEXERIL) 10 MG tablet Take 1 tablet by mouth 3 times daily as needed for muscle spasm. Warning: May cause drowsiness. 07/05/23   Mardella Layman, MD  diclofenac (VOLTAREN) 75 MG EC tablet Take 1 tablet (75 mg total) by mouth 2 (two) times daily. 07/05/23   Mardella Layman, MD  famotidine (PEPCID) 20 MG tablet Take 1 tablet (20 mg total) by mouth 2 (two) times daily. 08/17/22 11/15/22  Menshew, Charlesetta Ivory, PA-C  lidocaine (XYLOCAINE) 2 % solution Use as directed 15 mLs in the mouth or throat as needed for mouth pain. 01/10/23   Cathlyn Parsons, NP  oseltamivir (TAMIFLU) 75 MG capsule Take 1 capsule (75 mg total) by mouth 2 (two) times daily. 01/10/23   Cathlyn Parsons, NP  predniSONE (DELTASONE) 20 MG tablet Take 2 tablets (40 mg total) by mouth daily. 07/05/23   Mardella Layman, MD  SUMAtriptan (IMITREX) 25 MG tablet Take 1 tablet (25 mg total) by mouth every 2 (two) hours as needed for migraine. May repeat in 2 hours if headache persists or recurs.  Maximum of 3 tablets/day. 12/04/20   Particia Nearing, PA-C      Allergies    Kiwi extract, Peach [prunus persica], and Strawberry extract    Review of Systems   Review of Systems  All other systems reviewed and are negative.   Physical Exam Updated Vital Signs BP (!) 142/91 (BP Location: Right Arm)   Pulse 78   Temp 98.2 F (36.8 C) (Oral)   Resp 15   SpO2 100%  Physical Exam Vitals and nursing note reviewed.  Constitutional:      General: She is not in acute distress.    Appearance: Normal appearance. She is well-developed.  HENT:     Head: Normocephalic and atraumatic.  Eyes:     Conjunctiva/sclera: Conjunctivae normal.     Pupils: Pupils are equal, round, and reactive to light.  Cardiovascular:     Rate and Rhythm: Normal rate and regular rhythm.     Heart sounds: Normal heart sounds.  Pulmonary:     Effort: Pulmonary effort is normal. No respiratory distress.     Breath sounds: Normal breath sounds.  Abdominal:     General: There is no distension.     Palpations: Abdomen is soft.     Tenderness: There is no abdominal tenderness.  Musculoskeletal:        General: No deformity. Normal range of motion.     Cervical back: Normal range of motion and neck supple.  Skin:    General:  Skin is warm and dry.  Neurological:     General: No focal deficit present.     Mental Status: She is alert and oriented to person, place, and time. Mental status is at baseline.     Cranial Nerves: No cranial nerve deficit.     Sensory: No sensory deficit.     Motor: No weakness.     ED Results / Procedures / Treatments   Labs (all labs ordered are listed, but only abnormal results are displayed) Labs Reviewed - No data to display  EKG None  Radiology No results found.  Procedures Procedures    Medications Ordered in ED Medications  diphenhydrAMINE (BENADRYL) capsule 25 mg (25 mg Oral Given 09/11/23 1131)  dexamethasone (DECADRON) injection 10 mg (10 mg Intramuscular Given 09/11/23 1131)  ondansetron  (ZOFRAN-ODT) disintegrating tablet 8 mg (8 mg Oral Given 09/11/23 1134)    ED Course/ Medical Decision Making/ A&P                                 Medical Decision Making Risk Prescription drug management.    Medical Screen Complete  This patient presented to the ED with complaint of migraine.  This complaint involves an extensive number of treatment options. The initial differential diagnosis includes, but is not limited to, migraine headache  This presentation is: Acute, Chronic, Self-Limited, Previously Undiagnosed, and Uncertain Prognosis  Patient with known history of migraine.  Patient reports typical migraine headache x 2 days.  After medication in triage with Benadryl and Decadron patient feels much improved.  She desires discharge at the time of my evaluation.  Patient without indication for additional emergent workup of her symptoms.  Patient given prescription for Imitrex since she apparently has run out of same.  Importance of close follow-up stressed.  Strict return precautions given and understood.  Additional history obtained:  External records from outside sources obtained and reviewed including prior ED visits and prior Inpatient records.   Problem List / ED Course:  Migraine headache   Reevaluation:  After the interventions noted above, I reevaluated the patient and found that they have: resolved   Disposition:  After consideration of the diagnostic results and the patients response to treatment, I feel that the patent would benefit from close outpatient follow-up.          Final Clinical Impression(s) / ED Diagnoses Final diagnoses:  Other migraine without status migrainosus, not intractable    Rx / DC Orders ED Discharge Orders     None         Wynetta Fines, MD 09/11/23 [phone]

## 2023-09-11 NOTE — Discharge Instructions (Signed)
Return for any problem.  ?

## 2023-09-11 NOTE — ED Triage Notes (Signed)
Pt here with reports of migraine X2 days. Has taken tylenol and ibu without relief. States out of imitrex.

## 2023-09-11 NOTE — ED Notes (Signed)
Pt stated she was ready to go d/t migraine. Pt was able to review d/c paperwork but declined vitals

## 2023-09-12 ENCOUNTER — Ambulatory Visit (HOSPITAL_COMMUNITY): Payer: Self-pay

## 2023-10-12 ENCOUNTER — Emergency Department (HOSPITAL_COMMUNITY)
Admission: EM | Admit: 2023-10-12 | Discharge: 2023-10-12 | Disposition: A | Discharge status: Home / Self Care | Payer: 59

## 2023-10-12 ENCOUNTER — Other Ambulatory Visit: Payer: Self-pay

## 2023-10-12 DIAGNOSIS — Z20822 Contact with and (suspected) exposure to covid-19: Secondary | ICD-10-CM | POA: Insufficient documentation

## 2023-10-12 DIAGNOSIS — A084 Viral intestinal infection, unspecified: Secondary | ICD-10-CM | POA: Insufficient documentation

## 2023-10-12 DIAGNOSIS — R197 Diarrhea, unspecified: Secondary | ICD-10-CM

## 2023-10-12 DIAGNOSIS — R112 Nausea with vomiting, unspecified: Secondary | ICD-10-CM | POA: Diagnosis present

## 2023-10-12 LAB — GROUP A STREP BY PCR: Group A Strep by PCR: NOT DETECTED

## 2023-10-12 NOTE — ED Triage Notes (Signed)
Patient reports sore throat this week , with occasional diarrhea and emesis , denies fever or chills .

## 2023-10-12 NOTE — ED Provider Notes (Signed)
EMERGENCY DEPARTMENT AT [facility] Provider Note   CSN: [id number] Arrival date & time: 10/12/23  2114     History  Chief Complaint  Patient presents with   Sore Throat    Amy Mcfarland is a 37 y.o. female who presents with concern for 3 days of nausea vomiting diarrhea with 3 episodes of NBNB emesis in the last 24 hours and nonmelanotic stool.  Throat has become sore following vomiting, no congestion cough, shortness of breath or chest pain.  Subjective fevers and chills but no documented temperature at home.  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  Here primarily for work note.  Negative COVID test in the outpatient setting.  No medications daily.  HPI     Home Medications Prior to Admission medications   Medication Sig Start Date End Date Taking? Authorizing Provider  cyclobenzaprine (FLEXERIL) 10 MG tablet Take 1 tablet by mouth 3 times daily as needed for muscle spasm. Warning: May cause drowsiness. 07/05/23   Mardella Layman, MD  diclofenac (VOLTAREN) 75 MG EC tablet Take 1 tablet (75 mg total) by mouth 2 (two) times daily. 07/05/23   Mardella Layman, MD  famotidine (PEPCID) 20 MG tablet Take 1 tablet (20 mg total) by mouth 2 (two) times daily. 08/17/22 11/15/22  Menshew, Charlesetta Ivory, PA-C  lidocaine (XYLOCAINE) 2 % solution Use as directed 15 mLs in the mouth or throat as needed for mouth pain. 01/10/23   Cathlyn Parsons, NP  oseltamivir (TAMIFLU) 75 MG capsule Take 1 capsule (75 mg total) by mouth 2 (two) times daily. 01/10/23   Cathlyn Parsons, NP  predniSONE (DELTASONE) 20 MG tablet Take 2 tablets (40 mg total) by mouth daily. 07/05/23   Mardella Layman, MD  SUMAtriptan (IMITREX) 25 MG tablet Take 1 tablet (25 mg total) by mouth every 2 (two) hours as needed for migraine. May repeat in 2 hours if headache persists or recurs.  Maximum of 3 tablets/day. 12/04/20   Particia Nearing, PA-C  SUMAtriptan (IMITREX)  25 MG tablet Take 1 tablet (25 mg total) by mouth every 2 (two) hours as needed for migraine. May repeat in 2 hours if headache persists or recurs. 09/11/23   Wynetta Fines, MD      Allergies    Kiwi extract, Peach [prunus persica], and Strawberry extract    Review of Systems   Review of Systems  Constitutional: Negative.   HENT:  Positive for sore throat.   Respiratory: Negative.    Cardiovascular: Negative.   Gastrointestinal:  Positive for diarrhea, nausea and vomiting.  Genitourinary: Negative.     Physical Exam Updated Vital Signs BP (!) 128/104   Pulse (!) 101   Temp 99.1 F (37.3 C) (Oral)   Resp 18   Ht 5\' 6"  (1.676 m)   Wt 135 kg   SpO2 99%   BMI 48.04 kg/m  Physical Exam Vitals and nursing note reviewed.  Constitutional:      Appearance: She is obese. She is not ill-appearing or toxic-appearing.  HENT:     Head: Normocephalic and atraumatic.     Nose: Nose normal.     Mouth/Throat:     Mouth: Mucous membranes are moist.     Pharynx: Oropharynx is clear. Uvula midline. No oropharyngeal exudate or posterior oropharyngeal erythema.     Tonsils: No tonsillar exudate or tonsillar abscesses.  Eyes:     General:  Right eye: No discharge.        Left eye: No discharge.     Extraocular Movements: Extraocular movements intact.     Conjunctiva/sclera: Conjunctivae normal.     Pupils: Pupils are equal, round, and reactive to light.  Cardiovascular:     Rate and Rhythm: Normal rate and regular rhythm.     Pulses: Normal pulses.     Heart sounds: Normal heart sounds. No murmur heard. Pulmonary:     Effort: Pulmonary effort is normal. No respiratory distress.     Breath sounds: Normal breath sounds. No wheezing or rales.  Abdominal:     General: Bowel sounds are normal. There is no distension.     Palpations: Abdomen is soft.     Tenderness: There is no abdominal tenderness. There is no right CVA tenderness, left CVA tenderness, guarding or rebound.   Musculoskeletal:        General: No deformity.     Cervical back: Normal range of motion and neck supple.     Right lower leg: No edema.     Left lower leg: No edema.  Skin:    General: Skin is warm and dry.     Capillary Refill: Capillary refill takes less than 2 seconds.  Neurological:     General: No focal deficit present.     Mental Status: She is alert and oriented to person, place, and time. Mental status is at baseline.  Psychiatric:        Mood and Affect: Mood normal.     ED Results / Procedures / Treatments   Labs (all labs ordered are listed, but only abnormal results are displayed) Labs Reviewed  GROUP A STREP BY PCR  RESP PANEL BY RT-PCR (RSV, FLU A&B, COVID)  [id number]    EKG None  Radiology No results found.  Procedures Procedures    Medications Ordered in ED Medications - No data to display  ED Course/ Medical Decision Making/ A&P                                 Medical Decision Making 37 year old female presents with nausea vomiting diarrhea  Very mildly tachycardic on intake with heart rate of 101, heart rate in the 80s at time of my evaluation.  Hypertensive.  Cardiopulmonary exams unremarkable, abdominal exam is benign.  Patient without findings concerning for oropharyngeal abscess on pharyngeal exam.  Amount and/or Complexity of Data Reviewed Labs:     Details: Strep negative, RVP pending.      Clinical picture most consistent with acute viral gastroenteritis.  Patient already has prescription for Zofran at home, clinically appears well-hydrated.  Will provide work note recommend rest and hydration in the next few days.  Return precautions given.  Clinical concern for emergent underlying condition that would warrant further ED workup or inpatient management is exceedingly low.  Amy Mcfarland voiced understanding of her medical evaluation and treatment plan. Each of their questions answered to their expressed satisfaction.  Return precautions were  given.  Patient is well-appearing, stable, and was discharged in good condition.  This chart was dictated using voice recognition software, Dragon. Despite the best efforts of this provider to proofread and correct errors, errors may still occur which can change documentation meaning.         Final Clinical Impression(s) / ED Diagnoses Final diagnoses:  Nausea, vomiting and diarrhea    Rx / DC Orders ED Discharge Orders  None         Paris Lore, PA-C 10/12/23 2311    Coral Spikes, DO 10/12/23 2347

## 2023-10-12 NOTE — Discharge Instructions (Signed)
You are seen in the ER today for nausea vomiting and diarrhea.  Her vital signs and physical exam are reassuring.  You likely viral illness causing your symptoms.  Please monitor your hydration, follow-up with her primary care doctor and return to the ER with any severe symptoms.

## 2023-10-13 LAB — RESP PANEL BY RT-PCR (RSV, FLU A&B, COVID)  RVPGX2
Influenza A by PCR: NEGATIVE
Influenza B by PCR: NEGATIVE
Resp Syncytial Virus by PCR: NEGATIVE
SARS Coronavirus 2 by RT PCR: NEGATIVE

## 2023-12-07 ENCOUNTER — Encounter (HOSPITAL_COMMUNITY): Payer: Self-pay

## 2023-12-07 ENCOUNTER — Ambulatory Visit (HOSPITAL_COMMUNITY)
Admission: RE | Admit: 2023-12-07 | Discharge: 2023-12-07 | Disposition: A | Discharge status: Home / Self Care | Payer: 59 | Source: Ambulatory Visit | Attending: Family Medicine | Admitting: Family Medicine

## 2023-12-07 VITALS — BP 126/72 | HR 87 | Temp 97.9°F | Resp 16

## 2023-12-07 DIAGNOSIS — J069 Acute upper respiratory infection, unspecified: Secondary | ICD-10-CM | POA: Diagnosis not present

## 2023-12-07 LAB — POC COVID19/FLU A&B COMBO
Covid Antigen, POC: NEGATIVE
Influenza A Antigen, POC: NEGATIVE
Influenza B Antigen, POC: NEGATIVE

## 2023-12-07 MED ORDER — HYDROCODONE BIT-HOMATROP MBR 5-1.5 MG/5ML PO SOLN: 5.0000 mL | Freq: Four times a day (QID) | ORAL | 0 refills | Status: DC | PRN

## 2023-12-07 NOTE — ED Triage Notes (Signed)
Patient reports that she began having body aches, nasal congestion, a productive cough with blood-tinged green sputum, headache, right ear pain, sore throat, abdominal pain, SOB since yesterday.   Patient states she took Ibuprofen at approx. 0530 today.

## 2023-12-07 NOTE — Discharge Instructions (Signed)
Be aware, your cough medication may cause drowsiness. Please do not drive, operate heavy machinery or make important decisions while on this medication, it can cloud your judgement.  

## 2023-12-07 NOTE — ED Provider Notes (Signed)
[facility]   [id number] 12/07/23 Arrival Time: 1138  ASSESSMENT & PLAN:  1. Viral URI with cough    Discussed typical duration of likely viral illness. Results for orders placed or performed during the hospital encounter of 12/07/23  POC Covid19/Flu A&B Antigen   Collection Time: 12/07/23 12:52 PM  Result Value Ref Range   Influenza A Antigen, POC Negative Negative   Influenza B Antigen, POC Negative Negative   Covid Antigen, POC Negative Negative   OTC symptom care as needed.  New Prescriptions   HYDROCODONE BIT-HOMATROPINE (HYCODAN) 5-1.5 MG/5ML SYRUP    Take 5 mLs by mouth every 6 (six) hours as needed for cough.     Follow-up Information     Center, [facility].   Specialty: General Practice Why: As needed. Contact information: [address] [place] [address] [phone]                 Reviewed expectations re: course of current medical issues. Questions answered. Outlined signs and symptoms indicating need for more acute intervention. Understanding verbalized. After Visit Summary given.   SUBJECTIVE: History from: Patient. Amy Mcfarland is a 38 y.o. female. Patient reports that she began having body aches, nasal congestion, a productive cough, headache, right ear pain, sore throat. Denies SOB/CP. Patient states she took Ibuprofen at approx. 0530 today. Normal PO intake without n/v/d.  OBJECTIVE:  Vitals:   12/07/23 1204  BP: 126/72  Pulse: 87  Resp: 16  Temp: 97.9 F (36.6 C)  TempSrc: Oral  SpO2: 98%    General appearance: alert; no distress but appears very fatigued Eyes: PERRLA; EOMI; conjunctiva normal HENT: [place]; AT; with nasal congestion Neck: supple  Lungs: speaks full sentences without difficulty; unlabored; CTAB Extremities: no edema Skin: warm and dry Neurologic: normal gait Psychological: alert and cooperative; normal mood and affect  Labs: Results for orders placed or  performed during the hospital encounter of 12/07/23  POC Covid19/Flu A&B Antigen   Collection Time: 12/07/23 12:52 PM  Result Value Ref Range   Influenza A Antigen, POC Negative Negative   Influenza B Antigen, POC Negative Negative   Covid Antigen, POC Negative Negative   Labs Reviewed  POC COVID19/FLU A&B COMBO    Imaging: No results found.  Allergies  Allergen Reactions   Kiwi Extract Other (See Comments)    Makes mouth feel hairy   Peach [Prunus Persica] Other (See Comments)    Makes mouth feel hairy   Strawberry Extract Other (See Comments)    Makes mouth feel like it has hair in it, tongue sweeling    Past Medical History:  Diagnosis Date   Heart burn    Kidney infection    Kidney infection    Migraine    Migraines    Miscarriage    Social History   Socioeconomic History   Marital status: Single    Spouse name: Not on file   Number of children: Not on file   Years of education: Not on file   Highest education level: Not on file  Occupational History   Not on file  Tobacco Use   Smoking status: Every Day    Current packs/day: 1.00    Average packs/day: 1 pack/day for 7.0 years (7.0 ttl pk-yrs)    Types: Cigarettes   Smokeless tobacco: Never  Vaping Use   Vaping status: Never Used  Substance and Sexual Activity   Alcohol use: Yes    Alcohol/week: 2.0 standard  drinks of alcohol    Types: 2 Glasses of wine per week    Comment: occasionally   Drug use: Not Currently    Types: Marijuana   Sexual activity: Yes    Partners: Male    Birth control/protection: Condom, Injection  Other Topics Concern   Not on file  Social History Narrative   Not on file   Social Drivers of Health   Financial Resource Strain: Not on file  Food Insecurity: Not on file  Transportation Needs: Not on file  Physical Activity: Not on file  Stress: Not on file  Social Connections: Unknown (05/05/2023)   Received from [facility]   Social Network    Social Network: Not on  file  Intimate Partner Violence: Unknown (05/05/2023)   Received from [facility]   HITS    Physically Hurt: Not on file    Insult or Talk Down To: Not on file    Threaten Physical Harm: Not on file    Scream or Curse: Not on file   Family History  Problem Relation Age of Onset   Diabetes Mother    Hypertension Father    Diabetes Father    Asthma Maternal Uncle    Cancer Maternal Grandmother        breast cancer   Stomach cancer Paternal Grandfather    Past Surgical History:  Procedure Laterality Date   CESAREAN SECTION  2011   DILATION AND CURETTAGE OF UTERUS  2006     Mardella Layman, MD 12/07/23 1334

## 2023-12-13 ENCOUNTER — Encounter (HOSPITAL_COMMUNITY): Payer: Self-pay

## 2023-12-13 ENCOUNTER — Ambulatory Visit (HOSPITAL_COMMUNITY)
Admission: RE | Admit: 2023-12-13 | Discharge: 2023-12-13 | Disposition: A | Discharge status: Home / Self Care | Payer: 59 | Source: Ambulatory Visit | Attending: Emergency Medicine | Admitting: Emergency Medicine

## 2023-12-13 VITALS — BP 131/74 | HR 74 | Temp 98.0°F | Resp 16

## 2023-12-13 DIAGNOSIS — F17200 Nicotine dependence, unspecified, uncomplicated: Secondary | ICD-10-CM

## 2023-12-13 DIAGNOSIS — J22 Unspecified acute lower respiratory infection: Secondary | ICD-10-CM

## 2023-12-13 MED ORDER — DOXYCYCLINE HYCLATE 100 MG PO CAPS: 100.0000 mg | ORAL_CAPSULE | Freq: Two times a day (BID) | ORAL | 0 refills | Status: AC

## 2023-12-13 NOTE — Discharge Instructions (Addendum)
Take antibiotic as directed. Rest,push fluids, may use OTC meds of choice as label directed. Follow up with PCP. Return as needed.

## 2023-12-13 NOTE — ED Triage Notes (Signed)
Pt states cough,fever,body aches,sore throat,nasal congestion for the past week. States she was seen here last week for the same and that she is feeling worse.  States she is taking tylenol and motrin at home.

## 2023-12-13 NOTE — ED Provider Notes (Signed)
[facility]    CSN: [id number] Arrival date & time: 12/13/23  8416      History   Chief Complaint Chief Complaint  Patient presents with   Follow-up    Still not feeling any better feel like I'm worser - Entered by patient    HPI Amy Mcfarland is a 38 y.o. female.   38 year old female, Amy Mcfarland, presents to urgent care for evaluation of cough, fever ,body aches, sore throat, nasal congestion for the past week.  Patient states she was here last week for the same and has not improved is actually feeling worse.  Patient states she is taking Tylenol Motrin at home for symptom management.   The history is provided by the patient. No language interpreter was used.    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 Authorizing Provider  doxycycline (VIBRAMYCIN) 100 MG capsule Take 1 capsule (100 mg total) by mouth 2 (two) times daily for 7 days. 12/13/23 12/20/23 Yes Anothony Bursch, Para March, NP  HYDROcodone bit-homatropine (HYCODAN) 5-1.5 MG/5ML syrup Take 5 mLs by mouth every 6 (six) hours as needed for cough.  12/07/23   Mardella Layman, MD  SUMAtriptan (IMITREX) 25 MG tablet Take 1 tablet (25 mg total) by mouth every 2 (two) hours as needed for migraine. May repeat in 2 hours if headache persists or recurs.  Maximum of 3 tablets/day. 12/04/20   Particia Nearing, PA-C  SUMAtriptan (IMITREX) 25 MG tablet Take 1 tablet (25 mg total) by mouth every 2 (two) hours as needed for migraine. May repeat in 2 hours if headache persists or recurs. 09/11/23   Wynetta Fines, MD    Family History Family History  Problem Relation Age of Onset   Diabetes Mother    Hypertension Father    Diabetes Father    Asthma Maternal Uncle    Cancer Maternal Grandmother        breast cancer   Stomach cancer Paternal Grandfather     Social History Social History   Tobacco Use   Smoking status: Every Day    Current packs/day: 1.00    Average packs/day: 1 pack/day for 7.0 years (7.0 ttl pk-yrs)    Types: Cigarettes   Smokeless  tobacco: Never  Vaping Use   Vaping status: Never Used  Substance Use Topics   Alcohol use: Yes    Alcohol/week: 2.0 standard drinks of alcohol    Types: 2 Glasses of wine per week    Comment: occasionally   Drug use: Not Currently    Types: Marijuana     Allergies   Kiwi extract, Peach [prunus persica], and Strawberry extract   Review of Systems Review of Systems  Constitutional:  Positive for fever.  HENT:  Positive for congestion and sore throat.   Respiratory:  Positive for cough. Negative for wheezing.   Cardiovascular:  Negative for chest pain.  Musculoskeletal:  Positive for myalgias.  All other systems reviewed and are negative.    Physical Exam Triage Vital Signs ED Triage Vitals  Encounter Vitals Group     BP 12/13/23 0940 131/74     Systolic BP Percentile --      Diastolic BP Percentile --      Pulse Rate 12/13/23 0937 74     Resp 12/13/23 0937 16     Temp 12/13/23 0937 98 F (36.7 C)     Temp Source 12/13/23 0937 Oral     SpO2 12/13/23 0937 98 %      Weight --      Height --      Head Circumference --      Peak Flow --      Pain Score 12/13/23 0939 0     Pain Loc --      Pain Education --      Exclude from Growth Chart --    No data found.  Updated Vital Signs BP 131/74 (BP Location: Left Arm)   Pulse 74   Temp 98 F (36.7 C) (Oral)   Resp 16   SpO2 98%   Visual Acuity Right Eye Distance:   Left Eye Distance:   Bilateral Distance:    Right Eye Near:   Left Eye Near:    Bilateral Near:     Physical Exam Vitals and nursing note reviewed.  Constitutional:      General: She is not in acute distress.    Appearance: She is well-developed.  HENT:     Head: Normocephalic.     Right Ear: Tympanic membrane is retracted.     Left Ear: Tympanic membrane is retracted.     Nose: Mucosal edema and congestion present.     Mouth/Throat:     Lips: Pink.     Mouth: Mucous membranes are moist.     Pharynx: Oropharynx is clear.  Eyes:     General: Lids are normal.     Conjunctiva/sclera: Conjunctivae normal.     Pupils: Pupils are equal, round, and reactive to light.  Neck:     Trachea: No tracheal deviation.  Cardiovascular:     Rate and Rhythm: Normal rate and regular rhythm.     Pulses: Normal pulses.     Heart sounds: Normal heart sounds. No murmur heard. Pulmonary:     Effort: Pulmonary effort is normal.     Breath sounds: Normal breath sounds and air entry.  Abdominal:     General: Bowel sounds are normal.     Palpations: Abdomen is soft.     Tenderness: There is no abdominal tenderness.  Musculoskeletal:        General: Normal range of motion.     Cervical back: Normal range of motion.  Lymphadenopathy:     Cervical: No cervical  adenopathy.  Skin:    General: Skin is warm and dry.     Findings: No rash.  Neurological:     General: No focal deficit present.     Mental Status: She is alert and oriented to person, place, and time.     GCS: GCS eye subscore is 4. GCS verbal subscore is 5. GCS motor subscore is  6.  Psychiatric:        Attention and Perception: Attention normal.        Mood and Affect: Mood normal.        Speech: Speech normal.        Behavior: Behavior normal. Behavior is cooperative.      UC Treatments / Results  Labs (all labs ordered are listed, but only abnormal results are displayed) Labs Reviewed - No data to display  EKG   Radiology No results found.  Procedures Procedures (including critical care time)  Medications Ordered in UC Medications - No data to display  Initial Impression / Assessment and Plan / UC Course  I have reviewed the triage vital signs and the nursing notes.  Pertinent labs & imaging results that were available during my care of the patient were reviewed by me and considered in my medical decision making (see chart for details).    Discussed exam findings and plan of care with patient, tx with doxycycline, strict go to ER precautions given.   Patient verbalized understanding to this provider.  Ddx: Acute respiratory infection, allergies, smoker,viral illness Final Clinical Impressions(s) / UC Diagnoses   Final diagnoses:  Acute respiratory infection  Smoker     Discharge Instructions      Take antibiotic as directed. Rest,push fluids, may use OTC meds of choice as label directed. Follow up with PCP. Return as needed.     ED Prescriptions     Medication Sig Dispense Auth. Provider   doxycycline (VIBRAMYCIN) 100 MG capsule Take 1 capsule (100 mg total) by mouth 2 (two) times daily for 7 days. 14 capsule Kayle Passarelli, Para March, NP      PDMP not reviewed this encounter.   Clancy Gourd, NP 12/13/23 1007

## 2023-12-23 ENCOUNTER — Emergency Department (HOSPITAL_BASED_OUTPATIENT_CLINIC_OR_DEPARTMENT_OTHER)
Admission: EM | Admit: 2023-12-23 | Discharge: 2023-12-23 | Discharge status: Against Medical Advice | Payer: 59 | Attending: Emergency Medicine | Admitting: Emergency Medicine

## 2023-12-23 ENCOUNTER — Encounter (HOSPITAL_COMMUNITY): Payer: Self-pay

## 2023-12-23 ENCOUNTER — Ambulatory Visit (HOSPITAL_COMMUNITY)
Admission: RE | Admit: 2023-12-23 | Discharge: 2023-12-23 | Disposition: A | Discharge status: Home / Self Care | Payer: 59 | Source: Ambulatory Visit | Attending: Emergency Medicine | Admitting: Emergency Medicine

## 2023-12-23 ENCOUNTER — Other Ambulatory Visit: Payer: Self-pay

## 2023-12-23 VITALS — BP 181/94 | HR 83 | Temp 98.8°F | Resp 17

## 2023-12-23 DIAGNOSIS — K0889 Other specified disorders of teeth and supporting structures: Secondary | ICD-10-CM

## 2023-12-23 DIAGNOSIS — Z5321 Procedure and treatment not carried out due to patient leaving prior to being seen by health care provider: Secondary | ICD-10-CM | POA: Diagnosis not present

## 2023-12-23 MED ORDER — KETOROLAC TROMETHAMINE 30 MG/ML IJ SOLN
30.0000 mg | Freq: Once | INTRAMUSCULAR | Status: AC
Start: 2023-12-23 — End: 2023-12-23
  Administered 2023-12-23: 30 mg via INTRAMUSCULAR

## 2023-12-23 MED ORDER — IBUPROFEN 800 MG PO TABS: 800.0000 mg | ORAL_TABLET | Freq: Three times a day (TID) | ORAL | 0 refills | Status: DC

## 2023-12-23 MED ORDER — DEXAMETHASONE SODIUM PHOSPHATE 10 MG/ML IJ SOLN
10.0000 mg | Freq: Once | INTRAMUSCULAR | Status: AC
  Administered 2023-12-23: 10 mg via INTRAMUSCULAR

## 2023-12-23 MED ORDER — DEXAMETHASONE SODIUM PHOSPHATE 10 MG/ML IJ SOLN
INTRAMUSCULAR | Status: AC
Start: 2023-12-23 — End: ?
  Filled 2023-12-23: qty 1

## 2023-12-23 MED ORDER — KETOROLAC TROMETHAMINE 30 MG/ML IJ SOLN
INTRAMUSCULAR | Status: AC
Start: 2023-12-23 — End: ?
  Filled 2023-12-23: qty 1

## 2023-12-23 MED ORDER — LIDOCAINE VISCOUS HCL 2 % MT SOLN: 15.0000 mL | OROMUCOSAL | 0 refills | Status: DC | PRN

## 2023-12-23 NOTE — Discharge Instructions (Signed)
 We gave you a shot of Toradol , an anti-inflammatory, and a shot of Decadron , a steroid to help with inflammation, in clinic today. Otherwise alternate between 800mg  Ibuprofen  and Tylenol  every 4-6 hours as needed for pain. I have also sent a prescription for lidocaine  that you can gargle to help with discomfort as well. Keep your appointment with dentist for further evaluation and treatment. Return here as needed.

## 2023-12-23 NOTE — ED Triage Notes (Signed)
 Pt POV from home reporting dental pain past few days after biting hard candy and partially chipping tooth. Gums and upper lip now swollen. Taking tylenol  and using orajel with no improvement.

## 2023-12-23 NOTE — ED Provider Notes (Signed)
 [facility]    CSN: [id number] Arrival date & time: 12/23/23  1826      History   Chief Complaint Chief Complaint  Patient presents with   Appointment    HPI Amy Mcfarland is a 38 y.o. female.   Patient presents with right upper dental pain and swelling after biting into a jolly rancher 3 days ago and chipping her tooth. Patient states the pain is radiating into her cheek. Patient has a dentist appoint on 2/20, but has been unable to get any relief with ibuprofen  and oragel. Denies fever and facial swelling.      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 Authorizing Provider  ibuprofen  (ADVIL ) 800 MG tablet Take 1 tablet (800 mg total) by mouth 3 (three) times daily. 12/23/23  Yes Johnie, Trenese Haft A, NP  lidocaine  (XYLOCAINE ) 2 % solution Use as directed 15 mLs in the mouth or throat as needed for mouth pain. 12/23/23  Yes Johnie Flaming A, NP  SUMAtriptan  (IMITREX ) 25 MG tablet Take 1 tablet (25 mg total) by mouth every 2 (two) hours as needed for migraine. May repeat in 2 hours if headache persists  or recurs.  Maximum of 3 tablets/day. 12/04/20   Stuart Vernell Norris, PA-C  SUMAtriptan  (IMITREX ) 25 MG tablet Take 1 tablet (25 mg total) by mouth every 2 (two) hours as needed for migraine. May repeat in 2 hours if headache persists or recurs. 09/11/23   Laurice Maude BROCKS, MD    Family History Family History  Problem Relation Age of Onset   Diabetes Mother    Hypertension Father    Diabetes Father    Asthma Maternal Uncle    Cancer Maternal Grandmother        breast cancer   Stomach cancer Paternal Grandfather     Social History Social History   Tobacco Use   Smoking status: Every Day    Current packs/day: 1.00    Average packs/day: 1 pack/day for 7.0 years (7.0 ttl pk-yrs)    Types: Cigarettes   Smokeless tobacco: Never  Vaping Use   Vaping status: Never Used  Substance Use Topics   Alcohol use: Yes    Alcohol/week: 2.0 standard drinks of  alcohol    Types: 2 Glasses of wine per week    Comment: occasionally   Drug use: Not Currently    Types: Marijuana     Allergies   Kiwi extract, Peach [prunus persica], and Strawberry extract   Review of Systems Review of Systems  Constitutional:  Negative for fever.  HENT:  Positive for dental problem.      Physical Exam Triage Vital Signs ED Triage Vitals  Encounter Vitals Group     BP 12/23/23 1902 (!) 181/94     Systolic BP Percentile --      Diastolic BP Percentile --      Pulse Rate 12/23/23 1902 83     Resp 12/23/23 1902 17     Temp 12/23/23 1902 98.8 F (37.1 C)     Temp Source 12/23/23 1902 Oral     SpO2 12/23/23 1902 98 %     Weight --      Height --      Head Circumference --      Peak Flow --      Pain Score 12/23/23 1900 10     Pain Loc --      Pain Education --      Exclude from Growth Chart --    No data found.  Updated Vital Signs BP (!) 181/94 (BP Location: Left Arm)   Pulse 83   Temp 98.8 F (37.1 C) (Oral)   Resp 17   SpO2 98%   Visual Acuity Right Eye Distance:   Left Eye  Distance:   Bilateral Distance:    Right Eye Near:   Left Eye Near:    Bilateral Near:     Physical Exam Vitals and nursing note reviewed.  Constitutional:      General: She is awake. She is not in acute distress.    Appearance: Normal appearance. She is well-developed and well-groomed. She is not ill-appearing.  HENT:     Mouth/Throat:     Dentition: Abnormal dentition. Dental tenderness, gingival swelling and dental caries present. No dental abscesses.      Comments: Upper left part of tooth is chipped with surrounding tenderness and mild gingival swelling. Without signs of infection or abscess.  Neurological:     Mental Status: She is alert.  Psychiatric:        Behavior: Behavior is cooperative.      UC Treatments / Results  Labs (all labs ordered are listed, but only abnormal results are displayed) Labs Reviewed - No data to display  EKG   Radiology No results found.  Procedures Procedures (including critical care time)  Medications Ordered in UC Medications  ketorolac  (TORADOL ) 30 MG/ML injection 30 mg (has no administration in time range)  dexamethasone  (DECADRON ) injection 10 mg (has no administration in time range)    Initial Impression / Assessment and Plan / UC Course  I have reviewed the triage vital signs and the nursing notes.  Pertinent labs & imaging results that were available during my care of the patient were reviewed by me and considered in my medical decision making (see chart for details).     Patient presented with dental pain and swelling after chipping her tooth on a jolly rancher 3 days ago.   Upon assessment the upper left part of tooth is chipper with surrounding tenderness and mild gingival swelling. No signs of infection or abscess.  Given IM Toradol  and Decadron  in clinic for pain and inflammation. Prescribed 800mg  Ibuprofen  and lidocaine  for  pain. Discussed follow-up and return precautions.  Final Clinical Impressions(s) / UC  Diagnoses   Final diagnoses:  Pain, dental     Discharge Instructions      We gave you a shot of Toradol , an anti-inflammatory, and a shot of Decadron , a steroid to help with inflammation, in clinic today. Otherwise alternate between 800mg  Ibuprofen  and Tylenol  every 4-6 hours as needed for pain. I have also sent a prescription for lidocaine  that you can gargle to help with discomfort as well. Keep your appointment with dentist for further evaluation and treatment. Return here as needed.     ED Prescriptions     Medication Sig Dispense Auth. Provider   ibuprofen  (ADVIL ) 800 MG tablet Take 1 tablet (800 mg total) by mouth 3 (three) times daily. 21 tablet Johnie Flaming A, NP   lidocaine  (XYLOCAINE ) 2 % solution Use as directed 15 mLs in the mouth or throat as needed for mouth pain. 100 mL Johnie Flaming A, NP      PDMP not reviewed this encounter.   Johnie Flaming A, NP 12/23/23 [phone]

## 2023-12-23 NOTE — ED Triage Notes (Signed)
 Pt reports that 3 days ago eating something that chipped a front tooth. Pt reports dental pain that has spread all over right side of face. Tried oragel, and over OTC medications without relief unable to get into dentist for a couple weeks,

## 2024-03-21 ENCOUNTER — Ambulatory Visit (HOSPITAL_COMMUNITY): Payer: Self-pay

## 2024-03-26 ENCOUNTER — Ambulatory Visit (HOSPITAL_COMMUNITY): Payer: Self-pay

## 2024-03-27 ENCOUNTER — Ambulatory Visit (HOSPITAL_COMMUNITY): Payer: Self-pay

## 2024-04-03 ENCOUNTER — Encounter (HOSPITAL_BASED_OUTPATIENT_CLINIC_OR_DEPARTMENT_OTHER): Payer: Self-pay | Admitting: Emergency Medicine

## 2024-04-03 ENCOUNTER — Emergency Department (HOSPITAL_BASED_OUTPATIENT_CLINIC_OR_DEPARTMENT_OTHER)
Admission: EM | Admit: 2024-04-03 | Discharge: 2024-04-03 | Disposition: A | Discharge status: Home / Self Care | Attending: Emergency Medicine | Admitting: Emergency Medicine

## 2024-04-03 ENCOUNTER — Other Ambulatory Visit: Payer: Self-pay

## 2024-04-03 ENCOUNTER — Emergency Department (HOSPITAL_BASED_OUTPATIENT_CLINIC_OR_DEPARTMENT_OTHER)

## 2024-04-03 DIAGNOSIS — R600 Localized edema: Secondary | ICD-10-CM | POA: Diagnosis not present

## 2024-04-03 DIAGNOSIS — M79605 Pain in left leg: Secondary | ICD-10-CM | POA: Insufficient documentation

## 2024-04-03 DIAGNOSIS — M79672 Pain in left foot: Secondary | ICD-10-CM | POA: Insufficient documentation

## 2024-04-03 NOTE — Discharge Instructions (Signed)
 You were seen today for left leg pain and left leg swelling.  Your x-rays and your ultrasound today were reassuring that I have low suspicion for any emergent causes for your symptoms today.  Will recommend that you continue to wrap your ankle, keep it elevated if it swelling and take ibuprofen  and Tylenol  for pain relief.  Take Tylenol  (acetominophen)  650mg  every 4-6 hours, as needed for pain or fever. Do not take more than 4,000 mg in a 24-hour period. As this may cause liver damage. While this is rare, if you begin to develop yellowing of the skin or eyes, stop taking and return to ER immediately.  Take Ibuprofen  400mg  every 4-6 hours for pain or fever, not exceeding 3,200 mg per day as more than 3,200mg  can cause Stomach irritation, dizziness, kidney issues with long-term use.  Recommend he also follow-up with the PCP for further monitoring of the swelling and is more testing may be needed to figure out what might be causing this.  Return to the ED if you been having new or worsening symptoms including chest pain or shortness of breath, fever, vision changes.

## 2024-04-03 NOTE — ED Triage Notes (Signed)
 Bilateral foot/ ankle swelling and pain.  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

## 2024-04-03 NOTE — ED Provider Notes (Signed)
 [facility] Provider Note   CSN: [id number] Arrival date & time: 04/03/24  1232     History  Chief Complaint  Patient presents with   Joint Swelling    Amy Mcfarland is a 38 y.o. female.  HPI Patient is a 38 year old female presents ED today with complaints of 3-day history of left lower leg swelling, left ankle pain, left dorsal foot pain, left calf tenderness.  Presented history of migraines, GERD.  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  Since then the swelling has continued to increase and she has developed some calf tenderness.  Denies history of clots.  Currently taking Depo for birth control.  Denies headache, visual changes, unilateral weakness, chest pain, shortness of breath, abdominal pain, nausea or vomiting, diarrhea, dysuria     Home Medications Prior to Admission medications   Medication Sig Start Date End Date Taking? Authorizing Provider  ibuprofen  (ADVIL ) 800 MG tablet Take 1 tablet (800 mg total) by mouth 3 (three) times daily. 12/23/23   Levora Reas A, NP  lidocaine  (XYLOCAINE ) 2 % solution Use as directed 15 mLs in the mouth or throat as needed for mouth pain. 12/23/23   Levora Reas A, NP  SUMAtriptan  (IMITREX ) 25 MG tablet Take 1 tablet (25 mg total) by mouth every 2 (two) hours as needed for migraine. May repeat in 2 hours if headache persists or recurs.  Maximum of 3 tablets/day. 12/04/20   Corbin Dess, PA-C  SUMAtriptan  (IMITREX ) 25 MG tablet Take 1 tablet (25 mg total) by mouth every 2 (two) hours as needed for migraine. May repeat in 2 hours if headache persists or recurs. 09/11/23   Burnette Carte, MD      Allergies    Kiwi extract, Peach [prunus persica], and Strawberry extract    Review of Systems   Review of Systems  Cardiovascular:  Positive for leg swelling.  Musculoskeletal:  Positive for arthralgias, joint swelling and  myalgias.  All other systems reviewed and are negative.   Physical Exam Updated Vital Signs BP 134/77 (BP Location: Left Arm)   Pulse 95   Temp 97.8 F (36.6 C)   Resp 16   Ht 5\' 8"  (1.727 m)   Wt (!) 144.4 kg   SpO2 99%   BMI 48.40 kg/m  Physical Exam Vitals and nursing note reviewed.  Constitutional:      General: She is not in acute distress.    Appearance: Normal appearance. She is not ill-appearing or diaphoretic.  HENT:     Head: Normocephalic and atraumatic.  Eyes:     General: No scleral icterus.       Right eye: No discharge.        Left eye: No discharge.     Extraocular Movements: Extraocular movements intact.     Conjunctiva/sclera: Conjunctivae normal.  Cardiovascular:     Rate and Rhythm: Normal rate and regular rhythm.     Pulses: Normal pulses.     Heart sounds: Normal heart sounds. No murmur heard.    No friction rub. No gallop.  Pulmonary:     Effort: Pulmonary effort is normal. No respiratory distress.     Breath sounds: Normal breath sounds. No stridor. No wheezing, rhonchi or rales.  Abdominal:     General: Abdomen is flat. There is no distension.     Palpations: Abdomen is soft.     Tenderness: There is  no abdominal tenderness. There is no right CVA tenderness, left CVA tenderness or guarding.  Musculoskeletal:        General: Swelling and tenderness (Tenderness noted to palpation over calf, left lateral malleolus, and dorsal aspect of left foot.) present. Normal range of motion.     Right lower leg: No edema.     Left lower leg: Edema present.     Comments: DP pulse 2+ bilaterally.  Skin:    General: Skin is warm and dry.     Capillary Refill: Capillary refill takes less than 2 seconds.     Findings: No bruising or erythema.  Neurological:     General: No focal deficit present.     Mental Status: She is alert and oriented to person, place, and time. Mental status is at baseline.     Sensory: No sensory deficit.     Motor: No weakness.   Psychiatric:        Mood and Affect: Mood normal.     ED Results / Procedures / Treatments   Labs (all labs ordered are listed, but only abnormal results are displayed) Labs Reviewed  PREGNANCY, URINE    EKG None  Radiology US  Venous Img Lower Unilateral Left Result Date: 04/03/2024 CLINICAL DATA:  Leg swelling and tenderness EXAM: LEFT LOWER EXTREMITY VENOUS DOPPLER ULTRASOUND TECHNIQUE: Gray-scale sonography with compression, as well as color and duplex ultrasound, were performed to evaluate the deep venous system(s) from the level of the common femoral vein through the popliteal and proximal calf veins. COMPARISON:  None Available. FINDINGS: VENOUS Normal compressibility of the common femoral, superficial femoral, and popliteal veins, as well as the visualized calf veins. Visualized portions of profunda femoral vein and great saphenous vein unremarkable. No filling defects to suggest DVT on grayscale or color Doppler imaging. Doppler waveforms show normal direction of venous flow, normal respiratory plasticity and response to augmentation. Limited views of the contralateral common femoral vein are unremarkable. OTHER None. Limitations: Patient body habitus IMPRESSION: Negative. Electronically Signed   By: Reagan Camera M.D.   On: 04/03/2024 15:41   DG Foot Complete Left Result Date: 04/03/2024 CLINICAL DATA:  dorsal foot and ankle pain and swelling. EXAM: LEFT FOOT - COMPLETE 3+ VIEW; LEFT ANKLE COMPLETE - 3+ VIEW COMPARISON:  None Available. FINDINGS: No acute fracture or dislocation. No aggressive osseous lesion. Ankle mortise appears intact. Calcaneal spur noted along the Achilles tendon attachment site. No focal soft tissue swelling. No radiopaque foreign bodies. IMPRESSION: No acute osseous abnormality of the left foot or ankle. Electronically Signed   By: Beula Brunswick M.D.   On: 04/03/2024 13:46   DG Ankle Complete Left Result Date: 04/03/2024 CLINICAL DATA:  dorsal foot and ankle  pain and swelling. EXAM: LEFT FOOT - COMPLETE 3+ VIEW; LEFT ANKLE COMPLETE - 3+ VIEW COMPARISON:  None Available. FINDINGS: No acute fracture or dislocation. No aggressive osseous lesion. Ankle mortise appears intact. Calcaneal spur noted along the Achilles tendon attachment site. No focal soft tissue swelling. No radiopaque foreign bodies. IMPRESSION: No acute osseous abnormality of the left foot or ankle. Electronically Signed   By: Beula Brunswick M.D.   On: 04/03/2024 13:46    Procedures Procedures    Medications Ordered in ED Medications - No data to display  ED Course/ Medical Decision Making/ A&P  Medical Decision Making Amount and/or Complexity of Data Reviewed Labs: ordered. Radiology: ordered.   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  Notes that she is now having left calf tenderness as well as continued swelling and pain when walking over those areas.  Denies history of clots.  On physical exam, patient is in no acute distress, afebrile, alert and orient x 4, speaking in full sentences, nontachypneic, nontachycardic.  LCTAB, no murmurs, no abdominal tenderness to palpation.  Left leg notably swollen when compared to right.  Left ankle also swollen and tender to palpation.  Mild dorsal foot tenderness to palpation.  Celine Collard' sign positive, tenderness noted in left calf.  Full ROM. Exam is otherwise unremarkable.  X-ray and DVT ultrasound were unremarkable.  I low suspicion for an emergent cause for causing symptoms at this time.  Will recommend the patient follow-up with PCP for further workup.  Low suspicion for gout or any infectious causes as there is no erythema or any overlying skin changes to these areas.  Will have her continue to Use anti-inflammatories and Tylenol  for pain relief. As well as have her wrap her ankle and  follow-up with the PCP.  Patient vital signs have remained stable throughout the course of patient's time in the ED. Low suspicion for any other emergent pathology at this time. I believe this patient is safe to be discharged. Provided strict return to ER precautions. Patient expressed agreement and understanding of plan. All questions were answered.  Differential diagnoses prior to evaluation: The emergent differential diagnosis includes, but is not limited to, muscle strain, ligamentous injury, venous insufficiency, lymphedema, DVT, fracture, dislocation, gout. This is not an exhaustive differential.   Past Medical History / Co-morbidities / Social History: Migraine, GERD  Additional history: Chart reviewed. Pertinent results include: Notes to have many visits to both urgent care and ED in the last 6 months  Lab Tests/Imaging studies: I personally interpreted labs/imaging and the pertinent results include:   X-ray of foot and ankle show no acute abnormalities DVT ultrasound of left leg was negative .I agree with the radiologist interpretation.    Medications:  I have reviewed the patients home medicines and have made adjustments as needed.   Disposition: After consideration of the diagnostic results and the patients response to treatment, I feel that the patient would benefit from discharge and treatment as above.   emergency department workup does not suggest an emergent condition requiring admission or immediate intervention beyond what has been performed at this time. The plan is: Follow-up PCP, return for new or worsening symptoms, ACE, symptomatic management at home. The patient is safe for discharge and has been instructed to return immediately for worsening symptoms, change in symptoms or any other concerns.  Final Clinical Impression(s) / ED Diagnoses Final diagnoses:  Left leg pain    Rx / DC Orders ED Discharge Orders     None         Vevelyn Gowers 04/03/24 1607    Sueellen Emery, MD 04/06/24 [phone]

## 2024-05-31 ENCOUNTER — Encounter (HOSPITAL_COMMUNITY): Payer: Self-pay

## 2024-05-31 ENCOUNTER — Ambulatory Visit (HOSPITAL_COMMUNITY)
Admission: RE | Admit: 2024-05-31 | Discharge: 2024-05-31 | Disposition: A | Discharge status: Home / Self Care | Source: Ambulatory Visit | Attending: Family Medicine | Admitting: Family Medicine

## 2024-05-31 VITALS — BP 146/83 | HR 71 | Temp 98.1°F | Resp 18 | Ht 68.0 in | Wt 289.0 lb

## 2024-05-31 DIAGNOSIS — K0889 Other specified disorders of teeth and supporting structures: Secondary | ICD-10-CM

## 2024-05-31 MED ORDER — CLINDAMYCIN HCL 300 MG PO CAPS: 300.0000 mg | ORAL_CAPSULE | Freq: Three times a day (TID) | ORAL | 0 refills | Status: DC

## 2024-05-31 MED ORDER — HYDROCODONE-ACETAMINOPHEN 5-325 MG PO TABS: 1.0000 | ORAL_TABLET | Freq: Four times a day (QID) | ORAL | 0 refills | Status: AC | PRN

## 2024-05-31 MED ORDER — DEXAMETHASONE SODIUM PHOSPHATE 10 MG/ML IJ SOLN
10.0000 mg | Freq: Once | INTRAMUSCULAR | Status: AC
  Administered 2024-05-31: 10 mg via INTRAMUSCULAR

## 2024-05-31 MED ORDER — DEXAMETHASONE SODIUM PHOSPHATE 10 MG/ML IJ SOLN
INTRAMUSCULAR | Status: AC
  Filled 2024-05-31: qty 1

## 2024-05-31 NOTE — Discharge Instructions (Signed)
 Be aware, you have been prescribed pain medications that may cause drowsiness. While taking this medication, do not take any other medications containing acetaminophen (Tylenol). Do not combine with alcohol or recreational drugs. Please do not drive, operate heavy machinery, or take part in activities that require making important decisions while on this medication as your judgement may be clouded.

## 2024-05-31 NOTE — ED Triage Notes (Signed)
 Patient presenting with left lower dental pain onset 3 days. Sates the bottom left tooth is broken. Was eating popcorn and a kernel broke the tooth. Has a dental appointment 06/14/24 Prescriptions or OTC medications tried: Yes- ibuprofen , tylenol , Orajel mouthwash    with no relief

## 2024-05-31 NOTE — ED Provider Notes (Signed)
 [facility]   [id number] 05/31/24 Arrival Time: 1115  ASSESSMENT & PLAN:  1. Pain, dental    No sign of abscess requiring I&D at this time.  Meds ordered this encounter  Medications   dexamethasone  (DECADRON ) injection 10 mg   HYDROcodone -acetaminophen  (NORCO/VICODIN) 5-325 MG tablet    Sig: Take 1 tablet by mouth every 6 (six) hours as needed for moderate pain (pain score 4-6) or severe pain (pain score 7-10).    Dispense:  8 tablet    Refill:  0   clindamycin  (CLEOCIN ) 300 MG capsule    Sig: Take 1 capsule (300 mg total) by mouth 3 (three) times daily.    Dispense:  30 capsule    Refill:  0    [place] Controlled Substances Registry consulted for this patient. I feel the risk/benefit ratio today is favorable for proceeding with this prescription for a controlled substance. Medication sedation precautions given.  Has dental f/u at end of the month.  Reviewed expectations re: course of current medical issues. Questions answered. Outlined signs and symptoms indicating need for more acute intervention. Patient verbalized understanding. After Visit Summary given.   SUBJECTIVE:  Amy Mcfarland is a 38 y.o. female who reports L lower dental/tooth pain; x 3 days; affecting work and daily activities. Tolerating PO; denies fever. Orajel mouthwash, Tylenol , ibuprofen  without help.   OBJECTIVE: Vitals:   05/31/24 1153  BP: (!) 146/83  Pulse: 71  Resp: 18  Temp: 98.1 F (36.7 C)  TempSrc: Oral  SpO2: 97%  Weight: 131.1 kg  Height: 5' 8 (1.727 m)    General appearance: alert; no distress HENT: normocephalic; atraumatic; dentition: fair; left lower gum without areas of fluctuance, drainage, or bleeding and with tenderness to palpation; normal jaw movement without difficulty Neck: supple without LAD; FROM; trachea midline Lungs: normal respirations; unlabored; speaks full sentences without difficulty Skin: warm and dry Psychological: alert and cooperative; normal  mood and affect  Allergies  Allergen Reactions   Kiwi Extract Other (See Comments)    Makes mouth feel hairy   Peach [Prunus Persica] Other (See Comments)    Makes mouth feel hairy   Strawberry Extract Other (See Comments)    Makes mouth feel like it has hair in it, tongue sweeling    Past Medical History:  Diagnosis Date   Heart burn    Kidney infection    Kidney infection    Migraine    Migraines    Miscarriage    Social History   Socioeconomic History   Marital status: Single    Spouse name: Not on file   Number of children: Not on file   Years of education: Not on file   Highest education level: Not on file  Occupational History   Not on file  Tobacco Use   Smoking status: Every Day    Current packs/day: 1.00    Average packs/day: 1 pack/day for 7.0 years (7.0 ttl pk-yrs)    Types: Cigarettes   Smokeless tobacco: Never  Vaping Use   Vaping status: Never Used  Substance and Sexual Activity   Alcohol use: Yes    Alcohol/week: 2.0 standard drinks of alcohol    Types: 2 Glasses of wine per week    Comment: occasionally   Drug use: Not Currently    Types: Marijuana   Sexual activity: Yes    Partners: Male    Birth control/protection: Condom, Injection  Other Topics Concern   Not on file  Social History Narrative  Not on file   Social Drivers of Health   Financial Resource Strain: Not on file  Food Insecurity: Not on file  Transportation Needs: Not on file  Physical Activity: Not on file  Stress: Not on file  Social Connections: Unknown (05/05/2023)   [facility]   Social Network    Social Network: Not on file  Intimate Partner Violence: Unknown (05/05/2023)   Received from [facility]   HITS    Physically Hurt: Not on file    Insult or Talk Down To: Not on file    Threaten Physical Harm: Not on file    Scream or Curse: Not on file   Family History  Problem Relation Age of Onset   Diabetes Mother    Hypertension Father     Diabetes Father    Asthma Maternal Uncle    Cancer Maternal Grandmother        breast cancer   Stomach cancer Paternal Grandfather    Past Surgical History:  Procedure Laterality Date   CESAREAN SECTION  2011   DILATION AND CURETTAGE OF UTERUS  2006      Rolinda Rogue, MD 05/31/24 1512

## 2024-06-04 ENCOUNTER — Ambulatory Visit (HOSPITAL_COMMUNITY): Payer: Self-pay

## 2024-06-07 ENCOUNTER — Telehealth (HOSPITAL_COMMUNITY): Payer: Self-pay | Admitting: *Deleted

## 2024-06-07 MED ORDER — CLINDAMYCIN HCL 300 MG PO CAPS: 300.0000 mg | ORAL_CAPSULE | Freq: Three times a day (TID) | ORAL | 0 refills | Status: AC

## 2024-06-07 MED ORDER — IBUPROFEN 800 MG PO TABS: 800.0000 mg | ORAL_TABLET | Freq: Three times a day (TID) | ORAL | 0 refills | Status: AC | PRN

## 2024-06-07 NOTE — Telephone Encounter (Signed)
 Pt aware and would like IBU 800mg  sent to [facility]. Rx sent her verbal order from [place]

## 2024-06-07 NOTE — Telephone Encounter (Signed)
 Pt did not pick up meds on 05/31/2024. I called pharmacy to dc origianl rx so they can be resent to [facility]. I have resent the clindamycin , but will need a provider to resend HYDROcodone -acetaminophen  (NORCO/VICODIN) 5-325 MG tablet

## 2024-08-20 ENCOUNTER — Encounter: Admitting: Student

## 2024-08-20 NOTE — Progress Notes (Deleted)
   ANNUAL EXAM Patient name: Amy Mcfarland MRN [id number]  Date of birth: Jan 03, 1986 Chief Complaint:   No chief complaint on file.  History of Present Illness:   Amy Mcfarland is a 38 y.o. G82P1011 {race:25618} female being seen today for a routine annual exam.  Current complaints: ***  No LMP recorded. Patient has had an injection.   The pregnancy intention screening data noted above was reviewed. Potential methods of contraception were discussed. The patient elected to proceed with No data recorded.   Last pap ***. Results were: {Pap findings:25134}. H/O abnormal pap: {yes/yes***/no:23866} Last mammogram: ***. Results were: {normal, abnormal, n/a:23837}. Family h/o breast cancer: {yes***/no:23838} Last colonoscopy: ***. Results were: {normal, abnormal, n/a:23837}. Family h/o colorectal cancer: {yes***/no:23838}     07/07/2015    1:35 PM  Depression screen PHQ 2/9  Decreased Interest 0  Down, Depressed, Hopeless 0  PHQ - 2 Score 0         No data to display           Review of Systems:   Pertinent items are noted in HPI Denies any headaches, blurred vision, fatigue, shortness of breath, chest pain, abdominal pain, abnormal vaginal discharge/itching/odor/irritation, problems with periods, bowel movements, urination, or intercourse unless otherwise stated above. Pertinent History Reviewed:  Reviewed past medical,surgical, social and family history.  Reviewed problem list, medications and allergies. Physical Assessment:  There were no vitals filed for this visit.There is no height or weight on file to calculate BMI.        Physical Examination:   General appearance - well appearing, and in no distress  Mental status - alert, oriented to person, place, and time  Psych:  She has a normal mood and affect  Skin - warm and dry, normal color, no suspicious lesions noted  Chest - effort normal, all lung fields clear to auscultation bilaterally  Heart - normal rate and  regular rhythm  Neck:  midline trachea, no thyromegaly or nodules  Breasts - breasts appear normal, no suspicious masses, no skin or nipple changes or  axillary nodes  Abdomen - soft, nontender, nondistended, no masses or organomegaly  Pelvic - VULVA: normal appearing vulva with no masses, tenderness or lesions  VAGINA: normal appearing vagina with normal color and discharge, no lesions  CERVIX: normal appearing cervix without discharge or lesions, no CMT  Thin prep pap is {Desc; done/not:10129} *** HR HPV cotesting  UTERUS: uterus is felt to be normal size, shape, consistency and nontender   ADNEXA: No adnexal masses or tenderness noted.  Rectal - normal rectal, good sphincter tone, no masses felt. Hemoccult: ***  Extremities:  No swelling or varicosities noted  Chaperone present for exam  No results found for this or any previous visit (from the past 24 hours).  Assessment & Plan:  1) Well-Woman Exam  2) ***  Labs/procedures today: ***  Mammogram: {Mammo f/u:25212::@ 38yo}, or sooner if problems Colonoscopy: {TCS f/u:25213::@ 38yo}, or sooner if problems  No orders of the defined types were placed in this encounter.   Meds: No orders of the defined types were placed in this encounter.   Follow-up: No follow-ups on file.  Nat Dauer, NP 08/20/2024 1:06 PM

## 2024-09-20 ENCOUNTER — Emergency Department (HOSPITAL_BASED_OUTPATIENT_CLINIC_OR_DEPARTMENT_OTHER): Admission: EM | Admit: 2024-09-20 | Discharge: 2024-09-20 | Discharge status: Against Medical Advice

## 2024-09-20 DIAGNOSIS — Z5321 Procedure and treatment not carried out due to patient leaving prior to being seen by health care provider: Secondary | ICD-10-CM | POA: Insufficient documentation

## 2024-09-20 DIAGNOSIS — J029 Acute pharyngitis, unspecified: Secondary | ICD-10-CM | POA: Insufficient documentation

## 2024-09-20 DIAGNOSIS — M791 Myalgia, unspecified site: Secondary | ICD-10-CM | POA: Insufficient documentation

## 2024-10-16 ENCOUNTER — Telehealth: Admitting: Physician Assistant

## 2024-10-16 DIAGNOSIS — B9789 Other viral agents as the cause of diseases classified elsewhere: Secondary | ICD-10-CM

## 2024-10-16 MED ORDER — IPRATROPIUM BROMIDE 0.03 % NA SOLN: 2.0000 | Freq: Two times a day (BID) | NASAL | 0 refills | Status: AC

## 2024-10-16 NOTE — Progress Notes (Signed)
 We are sorry that you are not feeling well.  Here is how we plan to help!  Based on what you have shared with me it looks like you have sinusitis.  Sinusitis is inflammation and infection in the sinus cavities of the head.  Based on your presentation I believe you most likely have Acute Viral Sinusitis.This is an infection most likely caused by a virus. There is not specific treatment for viral sinusitis other than to help you with the symptoms until the infection runs its course.  You may use an oral decongestant such as Mucinex D or if you have glaucoma or high blood pressure use plain Mucinex. Saline nasal spray help and can safely be used as often as needed for congestion, I have prescribed: Ipratropium Bromide nasal spray 0.03% 2 sprays in eah nostril 2-3 times a day  Some authorities believe that zinc sprays or the use of Echinacea may shorten the course of your symptoms.  Sinus infections are not as easily transmitted as other respiratory infection, however we still recommend that you avoid close contact with loved ones, especially the very young and elderly.  Remember to wash your hands thoroughly throughout the day as this is the number one way to prevent the spread of infection!  Home Care: Only take medications as instructed by your medical team. Do not take these medications with alcohol. A steam or ultrasonic humidifier can help congestion.  You can place a towel over your head and breathe in the steam from hot water coming from a faucet. Avoid close contacts especially the very young and the elderly. Cover your mouth when you cough or sneeze. Always remember to wash your hands.  Get Help Right Away If: You develop worsening fever or sinus pain. You develop a severe head ache or visual changes. Your symptoms persist after you have completed your treatment plan.  Make sure you Understand these instructions. Will watch your condition. Will get help right away if you are not doing  well or get worse.  Your e-visit answers were reviewed by a board certified advanced clinical practitioner to complete your personal care plan.  Depending on the condition, your plan could have included both over the counter or prescription medications.  If there is a problem please reply  once you have received a response from your provider.  Your safety is important to us .  If you have drug allergies check your prescription carefully.    You can use MyChart to ask questions about today's visit, request a non-urgent call back, or ask for a work or school excuse for 24 hours related to this e-Visit. If it has been greater than 24 hours you will need to follow up with your provider, or enter a new e-Visit to address those concerns.  You will get an e-mail in the next two days asking about your experience.  I hope that your e-visit has been valuable and will speed your recovery. Thank you for using e-visits.  I have spent 5 minutes in review of e-visit questionnaire, review and updating patient chart, medical decision making and response to patient.   Elsie Velma Lunger, PA-C

## 2024-12-07 ENCOUNTER — Ambulatory Visit (HOSPITAL_COMMUNITY): Payer: Self-pay
